# Patient Record
Sex: Male | Born: 1960 | Race: Black or African American | Hispanic: No | Marital: Single | Smoking: Former smoker
Health system: Southern US, Community
[De-identification: ages and names within clinical notes are randomized; demographics above are authoritative.]

## PROBLEM LIST (undated history)

## (undated) ENCOUNTER — Emergency Department (HOSPITAL_COMMUNITY): Payer: Self-pay | Source: Home / Self Care

## (undated) DIAGNOSIS — E785 Hyperlipidemia, unspecified: Secondary | ICD-10-CM

## (undated) DIAGNOSIS — F1910 Other psychoactive substance abuse, uncomplicated: Secondary | ICD-10-CM

## (undated) DIAGNOSIS — Q446 Cystic disease of liver: Secondary | ICD-10-CM

## (undated) DIAGNOSIS — C801 Malignant (primary) neoplasm, unspecified: Secondary | ICD-10-CM

## (undated) DIAGNOSIS — K648 Other hemorrhoids: Secondary | ICD-10-CM

## (undated) HISTORY — PX: HEMORRHOID BANDING: SHX5850

## (undated) HISTORY — PX: COLONOSCOPY: SHX174

## (undated) HISTORY — DX: Other hemorrhoids: K64.8

## (undated) HISTORY — DX: Hyperlipidemia, unspecified: E78.5

## (undated) HISTORY — DX: Other psychoactive substance abuse, uncomplicated: F19.10

---

## 2003-10-24 ENCOUNTER — Emergency Department (HOSPITAL_COMMUNITY): Admission: EM | Admit: 2003-10-24 | Discharge: 2003-10-24 | Payer: Self-pay

## 2017-03-04 ENCOUNTER — Emergency Department (HOSPITAL_COMMUNITY)
Admission: EM | Admit: 2017-03-04 | Discharge: 2017-03-04 | Disposition: A | Discharge status: Home / Self Care | Payer: Self-pay | Attending: Emergency Medicine | Admitting: Emergency Medicine

## 2017-03-04 DIAGNOSIS — Y929 Unspecified place or not applicable: Secondary | ICD-10-CM | POA: Insufficient documentation

## 2017-03-04 DIAGNOSIS — X500XXA Overexertion from strenuous movement or load, initial encounter: Secondary | ICD-10-CM | POA: Insufficient documentation

## 2017-03-04 DIAGNOSIS — M545 Low back pain, unspecified: Secondary | ICD-10-CM

## 2017-03-04 DIAGNOSIS — Y990 Civilian activity done for income or pay: Secondary | ICD-10-CM | POA: Insufficient documentation

## 2017-03-04 DIAGNOSIS — Y9389 Activity, other specified: Secondary | ICD-10-CM | POA: Insufficient documentation

## 2017-03-04 DIAGNOSIS — K625 Hemorrhage of anus and rectum: Secondary | ICD-10-CM | POA: Insufficient documentation

## 2017-03-04 LAB — I-STAT CHEM 8, ED
BUN: 17 mg/dL (ref 6–20)
CREATININE: 1.2 mg/dL (ref 0.61–1.24)
Calcium, Ion: 1.18 mmol/L (ref 1.15–1.40)
Chloride: 104 mmol/L (ref 101–111)
GLUCOSE: 92 mg/dL (ref 65–99)
HEMATOCRIT: 44 % (ref 39.0–52.0)
HEMOGLOBIN: 15 g/dL (ref 13.0–17.0)
POTASSIUM: 4.3 mmol/L (ref 3.5–5.1)
Sodium: 140 mmol/L (ref 135–145)
TCO2: 29 mmol/L (ref 0–100)

## 2017-03-04 LAB — POC OCCULT BLOOD, ED: FECAL OCCULT BLD: POSITIVE — AB

## 2017-03-04 MED ORDER — CYCLOBENZAPRINE HCL 10 MG PO TABS: 10.0000 mg | ORAL_TABLET | Freq: Two times a day (BID) | ORAL | 0 refills | Status: DC | PRN

## 2017-03-04 NOTE — Discharge Planning (Signed)
EDCM provided orange card brochure as pt is uninsured and may need care coordination in the future.

## 2017-03-04 NOTE — ED Triage Notes (Signed)
Pt reports lower back pain and this am pain was in right groin.

## 2017-03-04 NOTE — Discharge Instructions (Signed)
Please read instructions below. Talk with your primary care provider about any new medications. Return for new numbness, tingling or weakness in your legs, loss of bowel or bladder, uncontrolled pain.

## 2017-03-04 NOTE — Discharge Planning (Signed)
Garrett Clark J. Clydene Laming, RN, BSN, [place] [phone] [facility] set up appointment with Dr Altamease Oiler on 03/09/17 @ 1100.  Spoke with pt at bedside and advised to please arrive 15 min early and take a picture ID and your current medications.  Pt verbalizes understanding of keeping appointment.

## 2017-03-04 NOTE — ED Provider Notes (Signed)
Garrett Clark Provider Note   CSN: [id number] Arrival date & time: 03/04/17  6967     History   Chief Complaint Chief Complaint  Patient presents with  . Back Pain    HPI EMON LANCE is a 56 y.o. male.  Pt presents with low back pain w/ recent injury 3 weeks ago as well as intermittent hx of rectal bleeding since 2013. Pt reports he was lifting something heavy at work and had pain for a few days which subsided. He reports that his pain was minimal until this morning when we woke with increased pain and stiffness. Describes pain as sharp and 10/10 with movement (R>L side) and radiates to groin, mostly with standing from seated position and sitting down from standing position. States his pain is aching at rest. Reports that he took a total of 1600mg  of ibuprofen this morning around 8am. Denies loss of bowel or bladder, N/T in lower extremities.  Denies hx of major back injury. Pt reports rectal bleeding is intermittent since 2013, small amounts of bright red blood or dark red blood. Denies N/V/D/C, abd pain, hx colonoscopy, hemorrhoids.      No past medical history on file.  There are no active problems to display for this patient.   No past surgical history on file.     Home Medications    Prior to Admission medications   Not on File    Family History No family history on file.  Social History Social History  Substance Use Topics  . Smoking status: Not on file  . Smokeless tobacco: Not on file  . Alcohol use Not on file     Allergies   Patient has no known allergies.   Review of Systems Review of Systems  Constitutional: Negative for chills and fever.  Respiratory: Negative for shortness of breath.   Cardiovascular: Negative for chest pain.  Gastrointestinal: Positive for anal bleeding (intermittent since 2013). Negative for abdominal pain, nausea and vomiting.       No bowel incontinence  Genitourinary:       No bladder incontinence    Musculoskeletal: Positive for back pain (lower back pain w radiation to groin. Aching at rest, sharp with movement - changing from seated to standing position and vice versa. ).  Neurological: Negative for weakness and numbness.       No N/T in lower extremities. No saddle paresthesia     Physical Exam Updated Vital Signs BP 127/60 (BP Location: Right Arm)   Pulse (!) 55   Temp 97.8 F (36.6 C) (Oral)   Resp 16   SpO2 100%   Physical Exam  Constitutional: He is oriented to person, place, and time. He appears well-developed and well-nourished. No distress.  HENT:  Head: Normocephalic and atraumatic.  Eyes: EOM are normal.  Neck: Normal range of motion. Neck supple.  Cardiovascular: Regular rhythm, normal heart sounds and intact distal pulses.   2+ distal pulses  Pulmonary/Chest: Effort normal and breath sounds normal. He has no wheezes. He has no rales.  Abdominal: Soft. Bowel sounds are normal. He exhibits no distension and no mass. There is no tenderness.  Chaperone was present for exam. Normal rectal tone. No gross blood, positive fecal hemoccult. No hemorrhoids.   Musculoskeletal: Normal range of motion. He exhibits tenderness (TTP lower lumar spine and extends laterally.  ).  Neurological: He is alert and oriented to person, place, and time. No sensory deficit (Sensation equal b/l lower extremities).  Skin: Skin is warm  and dry.     ED Treatments / Results  Labs (all labs ordered are listed, but only abnormal results are displayed) Labs Reviewed - No data to display  EKG  EKG Interpretation None       Radiology No results found.  Procedures Procedures (including critical care time)  Medications Ordered in ED Medications - No data to display   Initial Impression / Assessment and Plan / ED Course  I have reviewed the triage vital signs and the nursing notes.  Pertinent labs & imaging results that were available during my care of the patient were reviewed  by me and considered in my medical decision making (see chart for details).     Pt presents w low back pain w recent injury 3 weeks ago. Injury occurred when lifting a heavy object at work. Reports that pain went away a few days after injury until this morning when he woke with increased pain and stiffness. Pain worse with bending, radiates to groin. Denies N/T, bowel or bladder incontinence. Pt also reports intermittent bright red and dark red blood in stool since 2013. Reports he has never had a colonoscopy. Denies abd pain, N/V, D/C, fatigue, weakness, hemorrhoids. On exam, lower extremities NV intact. TTP lower lumbar spine and paraspinal musculature. Normal rectal tone, no gross blood, positive fecal hemoccult. H/H stable. Pt reports pain to be under control during visit since taking Advil this morning. Counseled patient to discontinue use of NSAIDs 2/t rectal bleeding until he discusses with PCP. Spoke with Case Management for PCP referral. Discharge with referral to PCP and Flexeril. Encouraged OTC Tylenol for pain.   Discussed results, findings, treatment and follow up. Patient advised of return precautions. Patient verbalized understanding and agreed with plan.   Final Clinical Impressions(s) / ED Diagnoses   Final diagnoses:  None    New Prescriptions New Prescriptions   No medications on file     [place] Nicole Russo, PA-C 03/04/17 [place], MD 03/04/17 [phone]

## 2017-03-09 ENCOUNTER — Encounter (INDEPENDENT_AMBULATORY_CARE_PROVIDER_SITE_OTHER): Payer: Self-pay | Admitting: Physician Assistant

## 2017-03-09 ENCOUNTER — Ambulatory Visit (INDEPENDENT_AMBULATORY_CARE_PROVIDER_SITE_OTHER): Payer: Self-pay | Admitting: Physician Assistant

## 2017-03-09 ENCOUNTER — Encounter: Payer: Self-pay | Admitting: Physician Assistant

## 2017-03-09 VITALS — BP 116/73 | HR 62 | Temp 97.8°F | Ht 68.0 in | Wt 186.6 lb

## 2017-03-09 DIAGNOSIS — K921 Melena: Secondary | ICD-10-CM

## 2017-03-09 DIAGNOSIS — M545 Low back pain, unspecified: Secondary | ICD-10-CM

## 2017-03-09 LAB — POCT URINALYSIS DIPSTICK
Bilirubin, UA: NEGATIVE
Blood, UA: NEGATIVE
Glucose, UA: NEGATIVE
KETONES UA: NEGATIVE
Leukocytes, UA: NEGATIVE
Nitrite, UA: NEGATIVE
PROTEIN UA: NEGATIVE
Spec Grav, UA: 1.025 (ref 1.030–1.035)
Urobilinogen, UA: 0.2 (ref ?–2.0)
pH, UA: 6 (ref 5.0–8.0)

## 2017-03-09 MED ORDER — ACETAMINOPHEN 500 MG PO TABS: 1000.0000 mg | ORAL_TABLET | Freq: Three times a day (TID) | ORAL | 0 refills | Status: AC | PRN

## 2017-03-09 MED ORDER — ACETAMINOPHEN 500 MG PO TABS: 1000.0000 mg | ORAL_TABLET | Freq: Three times a day (TID) | ORAL | 0 refills | Status: DC | PRN

## 2017-03-09 NOTE — Progress Notes (Signed)
Subjective:  Patient ID: Garrett Clark, male    DOB: [birth year]  Age: 57 y.o. MRN: [id number]  CC:  LBP and hematochezia   HPI Garrett Clark is a 56 y.o. male with a recent medical history of right sided lower back pain and chronic hematochezia presents for f/u of ED visit on 03/05/17. Was discharged on Flexeril with minimal relief. Also taking OTC tylenol extra strength. Current pain at very mild but pain can be severe at random times. Onset of pain almost 4 weeks ago when lifting approximately 10 lbs on each hand as he was retrieving these items out of a "heat box".  Aggravating factor not identified. Alleviating factors not identified. In regards to hematochezia, says onset was in 2013 and is usually waxing and waning in nature. ED did not see hemorrhoids. Denies chest pain, SOB, HA, abdominal pain, abdominal bloating, f/c/n/v, rash, or GU sxs.   ROS Review of Systems  Constitutional: Negative for chills, fever and malaise/fatigue.  Eyes: Negative for blurred vision.  Respiratory: Negative for shortness of breath.   Cardiovascular: Negative for chest pain and palpitations.  Gastrointestinal: Negative for abdominal pain and nausea.  Genitourinary: Negative for dysuria and hematuria.  Musculoskeletal: Positive for back pain (right sided lower back). Negative for joint pain and myalgias.  Skin: Negative for rash.  Neurological: Negative for tingling and headaches.  Psychiatric/Behavioral: Negative for depression. The patient is not nervous/anxious.     Objective:  BP 116/73 (BP Location: Left Arm, Patient Position: Sitting, Cuff Size: Normal)   Pulse 62   Temp 97.8 F (36.6 C) (Oral)   Ht 5\' 8"  (1.727 m)   Wt 186 lb 9.6 oz (84.6 kg)   SpO2 100%   BMI 28.37 kg/m   BP/Weight 03/09/2017 2/87/6811  Systolic BP 572 620  Diastolic BP 73 89  Wt. (Lbs) 186.6 -  BMI 28.37 -      Physical Exam  Constitutional: He is oriented to person, place, and time.  Well developed, well  nourished, NAD, polite  HENT:  Head: Normocephalic and atraumatic.  Eyes: No scleral icterus.  Neck: Normal range of motion. Neck supple.  Cardiovascular: Normal rate, regular rhythm and normal heart sounds.   Pulmonary/Chest: Effort normal and breath sounds normal.  Abdominal: Soft. Bowel sounds are normal. There is no tenderness.  Genitourinary:  Genitourinary Comments: No internal or external hemorrhoids  Musculoskeletal: He exhibits no edema.  Back without limitation in aROM, no pain elicited on movement.  Neurological: He is alert and oriented to person, place, and time. No cranial nerve deficit. Coordination normal.  Skin: Skin is warm and dry. No rash noted. He is not diaphoretic. No erythema. No pallor.  Psychiatric: He has a normal mood and affect. His behavior is normal. Thought content normal.  Vitals reviewed.    Assessment & Plan:   1. Acute right-sided low back pain without sciatica - Continue on Cyclobenzaprine - acetaminophen (TYLENOL) 500 MG tablet; Take 2 tablets (1,000 mg total) by mouth every 8 (eight) hours as needed.  Dispense: 14 tablet; Refill: 0 - Urinalysis Dipstick negative - DG Lumbar Spine Complete  2. Hematochezia - Fecal occult blood test positive 4 days ago. - No hemorrhoid by exam in clinic todays - Comprehensive metabolic panel - CBC with Differential/Platelet   Meds ordered this encounter  Medications  . acetaminophen (TYLENOL) 500 MG tablet    Sig: Take 2 tablets (1,000 mg total) by mouth every 8 (eight) hours as needed.  Dispense:  14 tablet    Refill:  0    Order Specific Question:   Supervising Provider    Answer:   Tresa Garter [[id number]]    Follow-up: 2 weeks for general physical exam  Clent Demark PA

## 2017-03-09 NOTE — Patient Instructions (Signed)

## 2017-03-10 LAB — CBC WITH DIFFERENTIAL/PLATELET
BASOS ABS: 0 10*3/uL (ref 0.0–0.2)
Basos: 1 %
EOS (ABSOLUTE): 0.5 10*3/uL — AB (ref 0.0–0.4)
Eos: 12 %
Hematocrit: 45 % (ref 37.5–51.0)
Hemoglobin: 15.7 g/dL (ref 13.0–17.7)
Immature Grans (Abs): 0 10*3/uL (ref 0.0–0.1)
Immature Granulocytes: 0 %
Lymphocytes Absolute: 2 10*3/uL (ref 0.7–3.1)
Lymphs: 46 %
MCH: 31.1 pg (ref 26.6–33.0)
MCHC: 34.9 g/dL (ref 31.5–35.7)
MCV: 89 fL (ref 79–97)
MONOS ABS: 0.3 10*3/uL (ref 0.1–0.9)
Monocytes: 6 %
Neutrophils Absolute: 1.5 10*3/uL (ref 1.4–7.0)
Neutrophils: 35 %
Platelets: 309 10*3/uL (ref 150–379)
RBC: 5.05 x10E6/uL (ref 4.14–5.80)
RDW: 14.4 % (ref 12.3–15.4)
WBC: 4.3 10*3/uL (ref 3.4–10.8)

## 2017-03-10 LAB — COMPREHENSIVE METABOLIC PANEL
A/G RATIO: 1.3 (ref 1.2–2.2)
ALBUMIN: 4.5 g/dL (ref 3.5–5.5)
ALK PHOS: 97 IU/L (ref 39–117)
ALT: 15 IU/L (ref 0–44)
AST: 24 IU/L (ref 0–40)
BUN/Creatinine Ratio: 15 (ref 9–20)
BUN: 17 mg/dL (ref 6–24)
Bilirubin Total: 0.5 mg/dL (ref 0.0–1.2)
CALCIUM: 9.5 mg/dL (ref 8.7–10.2)
CHLORIDE: 98 mmol/L (ref 96–106)
CO2: 21 mmol/L (ref 18–29)
Creatinine, Ser: 1.14 mg/dL (ref 0.76–1.27)
GFR calc Af Amer: 84 mL/min/{1.73_m2} (ref >59)
GFR calc non Af Amer: 73 mL/min/{1.73_m2} (ref >59)
GLOBULIN, TOTAL: 3.4 g/dL (ref 1.5–4.5)
Glucose: 65 mg/dL (ref 65–99)
POTASSIUM: 4.7 mmol/L (ref 3.5–5.2)
SODIUM: 140 mmol/L (ref 134–144)
TOTAL PROTEIN: 7.9 g/dL (ref 6.0–8.5)

## 2017-03-11 ENCOUNTER — Ambulatory Visit (HOSPITAL_COMMUNITY)
Admission: RE | Admit: 2017-03-11 | Discharge: 2017-03-11 | Disposition: A | Discharge status: Home / Self Care | Payer: Self-pay | Source: Ambulatory Visit | Attending: Physician Assistant | Admitting: Physician Assistant

## 2017-03-11 DIAGNOSIS — M4606 Spinal enthesopathy, lumbar region: Secondary | ICD-10-CM | POA: Insufficient documentation

## 2017-03-11 DIAGNOSIS — M545 Low back pain: Secondary | ICD-10-CM | POA: Insufficient documentation

## 2017-03-19 ENCOUNTER — Ambulatory Visit (INDEPENDENT_AMBULATORY_CARE_PROVIDER_SITE_OTHER): Payer: Self-pay | Admitting: Physician Assistant

## 2017-03-19 ENCOUNTER — Encounter: Payer: Self-pay | Admitting: Physician Assistant

## 2017-03-19 VITALS — BP 110/78 | HR 64 | Ht 68.0 in | Wt 184.0 lb

## 2017-03-19 DIAGNOSIS — K625 Hemorrhage of anus and rectum: Secondary | ICD-10-CM

## 2017-03-19 NOTE — Progress Notes (Addendum)
Subjective:    Patient ID: Garrett Clark, male    DOB: 1961-10-26, 56 y.o.   MRN: [id number]  HPI Garrett Clark is a pleasant 56 year old African-American male, new to GI today referred by Domenica Fail PA-C/[place]  family medicine for evaluation of intermittent rectal bleeding. Patient is generally in good health with no known chronic medical problems. He had recently been evaluated at primary care with complaints of low back pain and mentioned that he been having intermittent rectal bleeding over the past 4 years. He has not had any prior GI evaluation. Patient denies any abdominal pain, and no changes in bowel movements. He says he has intermittently sporadically seen red blood after bowel movements both on the tissue and in the commode and has on occasion seen some streaks of red blood in his stool. Never had any associated rectal discomfort. He says he may see blood for couple of days and then perhaps not seen any blood for couple of months. Family history negative for colon cancer and polyps as far as he is aware. Recent labs done 03/09/2017 hemoglobin 15.7/hematocrit 45.0/MCV of 89 LFTs within normal limits  Review of Systems Pertinent positive and negative review of systems were noted in the above HPI section.  All other review of systems was otherwise negative.  Outpatient Encounter Prescriptions as of 03/19/2017  Medication Sig  . cyclobenzaprine (FLEXERIL) 10 MG tablet Take 1 tablet (10 mg total) by mouth 2 (two) times daily as needed for muscle spasms. (Patient taking differently: Take 10 mg by mouth as needed for muscle spasms. )   No facility-administered encounter medications on file as of 03/19/2017.    No Known Allergies There are no active problems to display for this patient.  Social History   Social History  . Marital status: Single    Spouse name: N/A  . Number of children: 2  . Years of education: N/A   Occupational History  . Garrett Clark    Social History Main  Topics  . Smoking status: Former Research scientist (life sciences)  . Smokeless tobacco: Never Used  . Alcohol use Yes     Comment: socially  . 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 Sexual activity: Not on file   Other Topics Concern  . Not on file   Social History Narrative  . No narrative on file    [name] family history includes Alcohol abuse in his mother; Cancer in his paternal grandfather; Diabetes in his father, mother, paternal grandmother, and sister; Hypertension in his mother; Kidney disease in his father; Stroke in his sister.      Objective:    Vitals:   03/19/17 1433  BP: 110/78  Pulse: 64    Physical Exam  well-developed African-American male in no acute distress, pleasant blood pressure 110/78, pulse 74, height 5 foot 8, weight 184, BMI 27.9. HEENT;nontraumatic normocephalic EOMI PERRLA sclera anicteric, Cardiovascular; regular rate and rhythm with S1-S2 no murmur or gallop, Pulmonary; clear bilaterally, Abdomen ;soft, nontender no palpable mass or hepatosplenomegaly bowel sounds are present on Rectal ;exam not done-rectal per PCP last week no internal or external hemorrhoids or lesions noted, Extremities ;no clubbing clubbing cyanosis or edema skin warm and dry, Neuropsych; mood and affect appropriate       Assessment & Plan:   #46 56 year old African-American male with intermittent rectal bleeding 4 years, no associated abdominal  discomfort changes in bowel habits or rectal discomfort. Rule out occult colon lesion, rule out internal hemorrhoidal bleeding #2 recent low back pain  Plan; Patient will be scheduled for colonoscopy with Dr. Carlean Purl. Procedure discussed in detail with patient including risks and benefits and he is agreeable to proceed.  Cherae Marton Genia Harold PA-C 03/19/2017   Cc: Clent Demark, PA-C   Agree with Ms. Genia Harold assessment and plan. Gatha Mayer, MD, [facility]

## 2017-03-19 NOTE — Patient Instructions (Signed)

## 2017-03-23 ENCOUNTER — Encounter (INDEPENDENT_AMBULATORY_CARE_PROVIDER_SITE_OTHER): Payer: Self-pay | Admitting: Physician Assistant

## 2017-03-23 ENCOUNTER — Ambulatory Visit (INDEPENDENT_AMBULATORY_CARE_PROVIDER_SITE_OTHER): Payer: Self-pay | Admitting: Physician Assistant

## 2017-03-23 VITALS — BP 99/58 | HR 53 | Temp 98.0°F | Resp 18 | Ht 68.0 in | Wt 184.0 lb

## 2017-03-23 DIAGNOSIS — M940 Chondrocostal junction syndrome [Tietze]: Secondary | ICD-10-CM

## 2017-03-23 DIAGNOSIS — Z0000 Encounter for general adult medical examination without abnormal findings: Secondary | ICD-10-CM

## 2017-03-23 DIAGNOSIS — F1411 Cocaine abuse, in remission: Secondary | ICD-10-CM | POA: Insufficient documentation

## 2017-03-23 DIAGNOSIS — Z23 Encounter for immunization: Secondary | ICD-10-CM

## 2017-03-23 LAB — POCT URINALYSIS DIPSTICK
Glucose, UA: NEGATIVE
Leukocytes, UA: NEGATIVE
Nitrite, UA: NEGATIVE
PROTEIN UA: 100
RBC UA: NEGATIVE
Spec Grav, UA: >1.035 — AB (ref 1.030–1.035)
UROBILINOGEN UA: 1 (ref ?–2.0)
pH, UA: 5.5 (ref 5.0–8.0)

## 2017-03-23 MED ORDER — TETANUS-DIPHTH-ACELL PERTUSSIS 5-2.5-18.5 LF-MCG/0.5 IM SUSP
0.5000 mL | Freq: Once | INTRAMUSCULAR | Status: AC
  Administered 2017-03-23: 0.5 mL via INTRAMUSCULAR

## 2017-03-23 NOTE — Patient Instructions (Signed)
Keep your colonoscopy appointment for April.    Td Vaccine (Tetanus and Diphtheria): What You Need to Know 1. Why get vaccinated? Tetanus  and diphtheria are very serious diseases. They are rare in the [place] today, but people who do become infected often have severe complications. Td vaccine is used to protect adolescents and adults from both of these diseases. Both tetanus and diphtheria are infections caused by bacteria. Diphtheria spreads from person to person through coughing or sneezing. Tetanus-causing bacteria enter the body through cuts, scratches, or wounds. TETANUS (lockjaw) causes painful muscle tightening and stiffness, usually all over the body.  It can lead to tightening of muscles in the head and neck so you can't open your mouth, swallow, or sometimes even breathe. Tetanus kills about 1 out of every 10 people who are infected even after receiving the best medical care. DIPHTHERIA can cause a thick coating to form in the back of the throat.  It can lead to breathing problems, paralysis, heart failure, and death. Before vaccines, as many as 200,000 cases of diphtheria and hundreds of cases of tetanus were reported in the [place] each year. Since vaccination began, reports of cases for both diseases have dropped by about 99%. 2. Td vaccine Td vaccine can protect adolescents and adults from tetanus and diphtheria. Td is usually given as a booster dose every 10 years but it can also be given earlier after a severe and dirty wound or burn. Another vaccine, called Tdap, which protects against pertussis in addition to tetanus and diphtheria, is sometimes recommended instead of Td vaccine. Your doctor or the person giving you the vaccine can give you more information. Td may safely be given at the same time as other vaccines. 3. Some people should not get this vaccine  A person who has ever had a life-threatening allergic reaction after a previous dose of any tetanus or  diphtheria containing vaccine, OR has a severe allergy to any part of this vaccine, should not get Td vaccine. Tell the person giving the vaccine about any severe allergies.  Talk to your doctor if you:  had severe pain or swelling after any vaccine containing diphtheria or tetanus,  ever had a condition called Guillain Barre Syndrome (GBS),  aren't feeling well on the day the shot is scheduled. 4. What are the risks from Td vaccine? With any medicine, including vaccines, there is a chance of side effects. These are usually mild and go away on their own. Serious reactions are also possible but are rare. Most people who get Td vaccine do not have any problems with it. Mild problems following Td vaccine:  (Did not interfere with activities)  Pain where the shot was given (about 8 people in 10)  Redness or swelling where the shot was given (about 1 person in 4)  Mild fever (rare)  Headache (about 1 person in 4)  Tiredness (about 1 person in 4) Moderate problems following Td vaccine:  (Interfered with activities, but did not require medical attention)  Fever over 102F (rare) Severe problems following Td vaccine:  (Unable to perform usual activities; required medical attention)  Swelling, severe pain, bleeding and/or redness in the arm where the shot was given (rare). Problems that could happen after any vaccine:   People sometimes faint after a medical procedure, including vaccination. Sitting or lying down for about 15 minutes can help prevent fainting, and injuries caused by a fall. Tell your doctor if you feel dizzy, or have vision changes or  ringing in the ears.  Some people get severe pain in the shoulder and have difficulty moving the arm where a shot was given. This happens very rarely.  Any medication can cause a severe allergic reaction. Such reactions from a vaccine are very rare, estimated at fewer than 1 in a million doses, and would happen within a few minutes to a few  hours after the vaccination. As with any medicine, there is a very remote chance of a vaccine causing a serious injury or death. The safety of vaccines is always being monitored. For more information, visit: http://www.aguilar.org/ 5. What if there is a serious reaction? What should I look for?  Look for anything that concerns you, such as signs of a severe allergic reaction, very high fever, or unusual behavior. Signs of a severe allergic reaction can include hives, swelling of the face and throat, difficulty breathing, a fast heartbeat, dizziness, and weakness. These would usually start a few minutes to a few hours after the vaccination. What should I do?   If you think it is a severe allergic reaction or other emergency that can't wait, call 9-1-1 or get the person to the nearest hospital. Otherwise, call your doctor.  Afterward, the reaction should be reported to the Vaccine Adverse Event Reporting System (VAERS). Your doctor might file this report, or you can do it yourself through the VAERS web site at www.vaers.SamedayNews.es, or by calling 985-389-0570.  VAERS does not give medical advice. 6. The National Vaccine Injury Compensation Program The [employer] Vaccine Injury Compensation Program (VICP) is a federal program that was created to compensate people who may have been injured by certain vaccines. Persons who believe they may have been injured by a vaccine can learn about the program and about filing a claim by calling 9387048572 or visiting the [place] website at GoldCloset.com.ee. There is a time limit to file a claim for compensation. 7. How can I learn more?  Ask your doctor. He or she can give you the vaccine package insert or suggest other sources of information.  Call your local or state health department.  Contact the Centers for Disease Control and Prevention (CDC):  Call 989-877-0639 (1-800-CDC-INFO)  Visit CDC's website at http://hunter.com/ CDC Td  Vaccine VIS (04/01/16) This information is not intended to replace advice given to you by your health care provider. Make sure you discuss any questions you have with your health care provider. Document Released: 10/05/2006 Document Revised: 08/28/2016 Document Reviewed: 08/28/2016 Elsevier Interactive Patient Education  2017 [employer].

## 2017-03-23 NOTE — Progress Notes (Signed)
Subjective:  Patient ID: Garrett Clark, male    DOB: 1961/04/16  Age: 56 y.o. MRN: [id number]  CC: annual exam  HPI Garrett Clark is a 56 y.o. male with a PMH of cocaine use and LBP presents for an annual physical. Only complaints are occasional LBP attributed to work and occasional sharp chest pain "once every few months". Chest pain lasts momentarily and is associated with movement or deep inspiration. Chest pain nonradiating and not associated with other symptoms. LBP is also occasional and is relieved with cyclobenzaprine. Otherwise feels well. Does not use cocaine any longer.      Review of Systems  Constitutional: Negative for chills, fever and malaise/fatigue.  Eyes: Negative for blurred vision.  Respiratory: Negative for shortness of breath.   Cardiovascular: Positive for chest pain (sharp, "every 2-3 months"). Negative for palpitations.  Gastrointestinal: Negative for abdominal pain and nausea.  Genitourinary: Negative for dysuria and hematuria.  Musculoskeletal: Positive for back pain. Negative for joint pain and myalgias.  Skin: Negative for rash.  Neurological: Negative for tingling and headaches.  Psychiatric/Behavioral: Negative for depression. The patient is not nervous/anxious.     Objective:  BP (!) 99/58 (BP Location: Left Arm, Patient Position: Sitting, Cuff Size: Normal)   Pulse (!) 53   Temp 98 F (36.7 C) (Oral)   Resp 18   Ht 5\' 8"  (1.727 m)   Wt 184 lb (83.5 kg)   SpO2 97%   BMI 27.98 kg/m   BP/Weight 03/23/2017 03/19/2017 07/10/9469  Systolic BP 99 962 836  Diastolic BP 58 78 73  Wt. (Lbs) 184 184 186.6  BMI 27.98 27.98 28.37     Physical Exam  Constitutional: He is oriented to person, place, and time.  Well developed, well nourished, NAD, polite  HENT:  Head: Normocephalic and atraumatic.  Eyes: Pupils are equal, round, and reactive to light. No scleral icterus.  Neck: Normal range of motion. Neck supple. No thyromegaly present.   Cardiovascular: Normal rate, regular rhythm and normal heart sounds.   Pulmonary/Chest: Effort normal and breath sounds normal.  Abdominal: Soft. Bowel sounds are normal. There is no tenderness.  No hepatomegaly.   Genitourinary: Penis normal.  Genitourinary Comments: No inguinal hernia  Musculoskeletal: He exhibits no edema.  Full aROM of UEs, LEs, and back.  Neurological: He is alert and oriented to person, place, and time.  Skin: Skin is warm and dry. No rash noted. He is not diaphoretic. No erythema. No pallor.  Psychiatric: He has a normal mood and affect. His behavior is normal. Thought content normal.  Vitals reviewed.    Assessment & Plan:   1. Annual physical exam - POCT urinalysis dipstick negative in clinic today. - Tdap (BOOSTRIX) injection 0.5 mL; Inject 0.5 mLs into the muscle once. - PSA; Future - Lipid Panel; Future - ECG. Pt did not get done here today due to unavailability of staff knowledge on the use of [facility] ECG. Patient will be called tomorrow for a nurse visit appointment to have ECG done. Pt is asymptomatic but past cocaine use and low BP/HR indicate the need for prudence. Complaint of occasional sharp  chest pain "every 2-3 months" likely consistent with costochondritis.   2. Need for Tdap vaccination - Tdap (BOOSTRIX) injection 0.5 mL; Inject 0.5 mLs into the muscle once.   Meds ordered this encounter  Medications  . Tdap (BOOSTRIX) injection 0.5 mL    Follow-up: Return in about 4 weeks (around 04/20/2017) for LBP.   Lesli Albee  Altamease Oiler PA

## 2017-03-23 NOTE — Progress Notes (Signed)
Patient is here for Physical  Patient has not taken medication today. Patient has eaten today.  Patient tolerated injection well today.                                           Patient is here for Physical  Patient complains of lower back pain being present currently scaled at a 4.  Patient has taken flexeril and ibuprofen today. Patient has not eaten today.

## 2017-03-24 ENCOUNTER — Telehealth (INDEPENDENT_AMBULATORY_CARE_PROVIDER_SITE_OTHER): Payer: Self-pay | Admitting: Physician Assistant

## 2017-03-24 NOTE — Progress Notes (Signed)
  Subjective:  Patient ID: Garrett Clark, male    DOB: 1961-05-31  Age: 56 y.o. MRN: [id number]  CC:  Annual exam   HPI Garrett Clark is a 56 y.o. male with a PMH of cocaine use and LBP presents for an annual physical. Only complaints are occasional LBP attributed to work and occasional sharp chest pain "once every few months". Chest pain lasts momentarily and is associated with movement or deep inspiration. Chest pain nonradiating and not associated with other symptoms. LBP is also occasional and is relieved with cyclobenzaprine. Otherwise feels well. Does not use cocaine any longer.     ROS Review of Systems  Constitutional: Negative for chills, fever and malaise/fatigue.  Eyes: Negative for blurred vision.  Respiratory: Negative for shortness of breath.   Cardiovascular: Positive for chest pain. Negative for palpitations.  Gastrointestinal: Negative for abdominal pain and nausea.  Genitourinary: Negative for dysuria and hematuria.  Musculoskeletal: Positive for back pain. Negative for joint pain and myalgias.  Skin: Negative for rash.  Neurological: Negative for tingling and headaches.  Psychiatric/Behavioral: Negative for depression. The patient is not nervous/anxious.     Objective:  BP (!) 99/58 (BP Location: Left Arm, Patient Position: Sitting, Cuff Size: Normal)   Pulse (!) 53   Temp 98 F (36.7 C) (Oral)   Resp 18   Ht 5\' 8"  (1.727 m)   Wt 184 lb (83.5 kg)   SpO2 97%   BMI 27.98 kg/m   BP/Weight 03/23/2017 03/19/2017 4/78/2956  Systolic BP 99 213 086  Diastolic BP 58 78 73  Wt. (Lbs) 184 184 186.6  BMI 27.98 27.98 28.37      Physical Exam  Constitutional: He is oriented to person, place, and time.  Well developed, well nourished, NAD, polite  HENT:  Head: Normocephalic and atraumatic.  Eyes: No scleral icterus.  Neck: Normal range of motion. Neck supple. No thyromegaly present.  Cardiovascular: Normal rate, regular rhythm and normal heart sounds.    Pulmonary/Chest: Effort normal and breath sounds normal.  Abdominal: Soft. Bowel sounds are normal. There is no tenderness.  No hepatomegaly.  Genitourinary: Penis normal.  Genitourinary Comments: No inguinal hernia  Musculoskeletal: He exhibits no edema.  Full aROM of upper extremities, lower extremities, and back.  Neurological: He is alert and oriented to person, place, and time.  Skin: Skin is warm and dry. No rash noted. No erythema. No pallor.  Psychiatric: He has a normal mood and affect. His behavior is normal. Thought content normal.  Vitals reviewed.    Assessment & Plan:   1. Annual physical exam - POCT urinalysis dipstick - Tdap (BOOSTRIX) injection 0.5 mL; Inject 0.5 mLs into the muscle once. - PSA; Future - Lipid Panel; Future  2. Need for Tdap vaccination - Tdap (BOOSTRIX) injection 0.5 mL; Inject 0.5 mLs into the muscle once.  3. Costochondritis - Pt will still have to get EKG done. We could not complete EKG due to software conflict issue.  Meds ordered this encounter  Medications  . Tdap (BOOSTRIX) injection 0.5 mL    Follow-up: Return in about 4 weeks (around 04/20/2017) for LBP.   Clent Demark PA

## 2017-03-25 ENCOUNTER — Other Ambulatory Visit (INDEPENDENT_AMBULATORY_CARE_PROVIDER_SITE_OTHER): Payer: Self-pay | Admitting: Physician Assistant

## 2017-03-25 ENCOUNTER — Other Ambulatory Visit (INDEPENDENT_AMBULATORY_CARE_PROVIDER_SITE_OTHER): Payer: Self-pay

## 2017-03-25 ENCOUNTER — Encounter: Payer: Self-pay | Admitting: Physician Assistant

## 2017-03-25 VITALS — Wt 185.8 lb

## 2017-03-25 DIAGNOSIS — R079 Chest pain, unspecified: Secondary | ICD-10-CM

## 2017-03-25 NOTE — Progress Notes (Signed)
Pt here for labs and to get EKG done. EKG ordered yesterday but there was software conflict with [employer] and [employer]. EKG shows sinus bradycardia. I discussed with patient. No treatment needed at this time.

## 2017-03-26 LAB — PSA: Prostate Specific Ag, Serum: 0.6 ng/mL (ref 0.0–4.0)

## 2017-03-26 LAB — LIPID PANEL
CHOLESTEROL TOTAL: 231 mg/dL — AB (ref 100–199)
Chol/HDL Ratio: 5.4 ratio — ABNORMAL HIGH (ref 0.0–5.0)
HDL: 43 mg/dL (ref >39)
LDL Calculated: 170 mg/dL — ABNORMAL HIGH (ref 0–99)
TRIGLYCERIDES: 91 mg/dL (ref 0–149)
VLDL CHOLESTEROL CAL: 18 mg/dL (ref 5–40)

## 2017-03-26 LAB — PLEASE NOTE

## 2017-03-27 ENCOUNTER — Ambulatory Visit (INDEPENDENT_AMBULATORY_CARE_PROVIDER_SITE_OTHER): Payer: Self-pay

## 2017-03-30 ENCOUNTER — Other Ambulatory Visit (INDEPENDENT_AMBULATORY_CARE_PROVIDER_SITE_OTHER): Payer: Self-pay | Admitting: Physician Assistant

## 2017-03-30 DIAGNOSIS — E7800 Pure hypercholesterolemia, unspecified: Secondary | ICD-10-CM | POA: Insufficient documentation

## 2017-03-30 MED ORDER — ATORVASTATIN CALCIUM 20 MG PO TABS: 20.0000 mg | ORAL_TABLET | Freq: Every evening | ORAL | 1 refills | Status: DC

## 2017-03-30 NOTE — Progress Notes (Signed)
LDL elevated.

## 2017-04-02 ENCOUNTER — Telehealth (INDEPENDENT_AMBULATORY_CARE_PROVIDER_SITE_OTHER): Payer: Self-pay | Admitting: Physician Assistant

## 2017-04-02 NOTE — Telephone Encounter (Signed)
No answer. Will try again

## 2017-04-02 NOTE — Telephone Encounter (Signed)
I spoke to patient and he said he had EKG done. I did not see it in the system but CMA helped find the tracing. No acute abnormalities on EKG.

## 2017-04-03 ENCOUNTER — Ambulatory Visit (INDEPENDENT_AMBULATORY_CARE_PROVIDER_SITE_OTHER): Payer: Self-pay

## 2017-05-08 ENCOUNTER — Encounter: Payer: Self-pay | Admitting: Internal Medicine

## 2017-05-08 ENCOUNTER — Ambulatory Visit (AMBULATORY_SURGERY_CENTER): Payer: Self-pay | Admitting: Internal Medicine

## 2017-05-08 VITALS — BP 139/77 | HR 38 | Temp 98.4°F | Resp 18 | Ht 68.0 in | Wt 184.0 lb

## 2017-05-08 DIAGNOSIS — K625 Hemorrhage of anus and rectum: Secondary | ICD-10-CM

## 2017-05-08 MED ORDER — SODIUM CHLORIDE 0.9 % IV SOLN: 500.0000 mL | INTRAVENOUS | Status: DC

## 2017-05-08 NOTE — Progress Notes (Signed)
Last use of marijuana 05/08/17

## 2017-05-08 NOTE — Op Note (Signed)
[facility] Patient Name: Garrett Clark Procedure Date: 05/08/2017 2:34 PM MRN: [id number] Endoscopist: Gatha Mayer , MD Age: 56 Referring MD:  Date of Birth: May 16, 1961 Gender: Male Account #: [id number] Procedure:                Colonoscopy Indications:              Rectal bleeding Medicines:                Propofol per Anesthesia, Monitored Anesthesia Care Procedure:                Pre-Anesthesia Assessment:                           - Prior to the procedure, a History and Physical                            was performed, and patient medications and                            allergies were reviewed. The patient's tolerance of                            previous anesthesia was also reviewed. The risks                            and benefits of the procedure and the sedation                            options and risks were discussed with the patient.                            All questions were answered, and informed consent                            was obtained. Prior Anticoagulants: The patient has                            taken no previous anticoagulant or antiplatelet                            agents. ASA Grade Assessment: II - A patient with                            mild systemic disease. After reviewing the risks                            and benefits, the patient was deemed in                            satisfactory condition to undergo the procedure.                           After obtaining informed consent, the colonoscope  was passed under direct vision. Throughout the                            procedure, the patient's blood pressure, pulse, and                            oxygen saturations were monitored continuously. The                            Colonoscope was introduced through the anus and                            advanced to the the cecum, identified by                            appendiceal orifice and  ileocecal valve. The                            colonoscopy was performed without difficulty. The                            patient tolerated the procedure well. The quality                            of the bowel preparation was excellent. The bowel                            preparation used was Miralax. The ileocecal valve,                            appendiceal orifice, and rectum were photographed. Scope In: 2:41:01 PM Scope Out: 2:51:52 PM Scope Withdrawal Time: 0 hours 8 minutes 18 seconds  Total Procedure Duration: 0 hours 10 minutes 51 seconds  Findings:                 The perianal and digital rectal examinations were                            normal. Pertinent negatives include normal prostate                            (size, shape, and consistency).                           Internal hemorrhoids were found during retroflexion.                           The exam was otherwise without abnormality on                            direct and retroflexion views. Complications:            No immediate complications. Estimated Blood Loss:     Estimated blood loss: none. Impression:               - Internal  hemorrhoids.                           - The examination was otherwise normal on direct                            and retroflexion views.                           - No specimens collected. Recommendation:           - Patient has a contact number available for                            emergencies. The signs and symptoms of potential                            delayed complications were discussed with the                            patient. Return to normal activities tomorrow.                            Written discharge instructions were provided to the                            patient.                           - Resume previous diet.                           - Continue present medications.                           - Repeat colonoscopy in 10 years for screening                             purposes.                           - will arrange hemorrhoid banding if he is                            interested Gatha Mayer, MD 05/08/2017 2:55:50 PM This report has been signed electronically.

## 2017-05-08 NOTE — Progress Notes (Signed)
Report to PACU, RN, vss, BBS= Clear.  

## 2017-05-08 NOTE — Patient Instructions (Addendum)
I think you have been bleeding from hemorrhoids.  I can treat these with rubber banding if desired. Will discuss.  Next routine colonoscopy or other screening test in 10 years - 2028  I appreciate the opportunity to care for you. Gatha Mayer, MD, FACG   YOU HAD AN ENDOSCOPIC PROCEDURE TODAY AT [facility]:   Refer to the procedure report that was given to you for any specific questions about what was found during the examination.  If the procedure report does not answer your questions, please call your gastroenterologist to clarify.  If you requested that your care partner not be given the details of your procedure findings, then the procedure report has been included in a sealed envelope for you to review at your convenience later.  YOU SHOULD EXPECT: Some feelings of bloating in the abdomen. Passage of more gas than usual.  Walking can help get rid of the air that was put into your GI tract during the procedure and reduce the bloating. If you had a lower endoscopy (such as a colonoscopy or flexible sigmoidoscopy) you may notice spotting of blood in your stool or on the toilet paper. If you underwent a bowel prep for your procedure, you may not have a normal bowel movement for a few days.  Please Note:  You might notice some irritation and congestion in your nose or some drainage.  This is from the oxygen used during your procedure.  There is no need for concern and it should clear up in a day or so.  SYMPTOMS TO REPORT IMMEDIATELY:   Following lower endoscopy (colonoscopy or flexible sigmoidoscopy):  Excessive amounts of blood in the stool  Significant tenderness or worsening of abdominal pains  Swelling of the abdomen that is new, acute  Fever of 100F or higher    For urgent or emergent issues, a gastroenterologist can be reached at any hour by calling [phone].   DIET:  We do recommend a small meal at first, but then you may proceed to your  regular diet.  Drink plenty of fluids but you should avoid alcoholic beverages for 24 hours.  ACTIVITY:  You should plan to take it easy for the rest of today and you should NOT DRIVE or use heavy machinery until tomorrow (because of the sedation medicines used during the test).    FOLLOW UP: Our staff will call the number listed on your records the next business day following your procedure to check on you and address any questions or concerns that you may have regarding the information given to you following your procedure. If we do not reach you, we will leave a message.  However, if you are feeling well and you are not experiencing any problems, there is no need to return our call.  We will assume that you have returned to your regular daily activities without incident.  If any biopsies were taken you will be contacted by phone or by letter within the next 1-3 weeks.  Please call us at [phone] if you have not heard about the biopsies in 3 weeks.    SIGNATURES/CONFIDENTIALITY: You and/or your care partner have signed paperwork which will be entered into your electronic medical record.  These signatures attest to the fact that that the information above on your After Visit Summary has been reviewed and is understood.  Full responsibility of the confidentiality of this discharge information lies with you and/or your care-partner.    Information on  hemorrhoidal banding given to you today.Dr.Gessner's office will contact you to set up appointment for banding if you decide you want this procedure done

## 2017-05-11 ENCOUNTER — Telehealth: Payer: Self-pay | Admitting: *Deleted

## 2017-05-11 NOTE — Telephone Encounter (Signed)
No answer, unable to leave message, mailbox is full. 

## 2017-05-12 ENCOUNTER — Telehealth: Payer: Self-pay

## 2017-05-12 NOTE — Telephone Encounter (Signed)
Patient called back and booked a banding appt for 06/19/17 at 3pm.

## 2017-05-12 NOTE — Telephone Encounter (Signed)
-----   Message from Gatha Mayer, MD sent at 05/08/2017  3:00 PM EDT ----- Regarding: Guess what again!!! Banding appointment please

## 2017-05-12 NOTE — Telephone Encounter (Signed)
  Left detailed message to call us back to set up a banding appointment with Dr Carlean Purl.

## 2017-06-19 ENCOUNTER — Ambulatory Visit (INDEPENDENT_AMBULATORY_CARE_PROVIDER_SITE_OTHER): Payer: Self-pay | Admitting: Internal Medicine

## 2017-06-19 ENCOUNTER — Encounter: Payer: Self-pay | Admitting: Internal Medicine

## 2017-06-19 VITALS — BP 120/80 | HR 60 | Ht 69.0 in | Wt 174.2 lb

## 2017-06-19 DIAGNOSIS — K641 Second degree hemorrhoids: Secondary | ICD-10-CM

## 2017-06-19 NOTE — Patient Instructions (Addendum)
HEMORRHOID BANDING PROCEDURE    FOLLOW-UP CARE   1. The procedure you have had should have been relatively painless since the banding of the area involved does not have nerve endings and there is no pain sensation.  The rubber band cuts off the blood supply to the hemorrhoid and the band may fall off as soon as 48 hours after the banding (the band may occasionally be seen in the toilet bowl following a bowel movement). You may notice a temporary feeling of fullness in the rectum which should respond adequately to plain Tylenol or Motrin.  2. Following the banding, avoid strenuous exercise that evening and resume full activity the next day.  A sitz bath (soaking in a warm tub) or bidet is soothing, and can be useful for cleansing the area after bowel movements.     3. To avoid constipation, take two tablespoons of natural wheat bran, natural oat bran, flax, Benefiber or any over the counter fiber supplement and increase your water intake to 7-8 glasses daily.    4. Unless you have been prescribed anorectal medication, do not put anything inside your rectum for two weeks: No suppositories, enemas, fingers, etc.  5. Occasionally, you may have more bleeding than usual after the banding procedure.  This is often from the untreated hemorrhoids rather than the treated one.  Don't be concerned if there is a tablespoon or so of blood.  If there is more blood than this, lie flat with your bottom higher than your head and apply an ice pack to the area. If the bleeding does not stop within a half an hour or if you feel faint, call our office at [phone] or go to the emergency room.  6. Problems are not common; however, if there is a substantial amount of bleeding, severe pain, chills, fever or difficulty passing urine (very rare) or other problems, you should call us at [phone] or report to the nearest emergency room.  7. Do not stay seated continuously for more than 2-3 hours for a day or two  after the procedure.  Tighten your buttock muscles 10-15 times every two hours and take 10-15 deep breaths every 1-2 hours.  Do not spend more than a few minutes on the toilet if you cannot empty your bowel; instead re-visit the toilet at a later time.   We will see you back 07/10/17 at 3:15pm for additional banding.   I appreciate the opportunity to care for you. Silvano Rusk, MD, [facility]

## 2017-06-19 NOTE — Assessment & Plan Note (Signed)
Right anterior banded will return in about 3 weeks for repeat banding

## 2017-06-19 NOTE — Progress Notes (Signed)
  Hemorrhoidal ligation  Symptoms are bleeding and prolapse symptoms.  Rectal exam feels normal anoderm normal tone no mass nontender  Anoscopy demonstrates grade 2 internal hemorrhoids all 3 positions with inflammation, moderate  PROCEDURE NOTE: The patient presents with symptomatic grade 2  hemorrhoids, requesting rubber band ligation of his/her hemorrhoidal disease.  All risks, benefits and alternative forms of therapy were described and informed consent was obtained.   The anorectum was pre-medicated with 0.125% NTG and 5% lidocaine The decision was made to band the RA internal hemorrhoid, and the [place] was used to perform band ligation without complication.  Digital anorectal examination was then performed to assure proper positioning of the band, and to adjust the banded tissue as required.  The patient was discharged home without pain or other issues.  Dietary and behavioral recommendations were given and along with follow-up instructions.     The following adjunctive treatments were recommended:  Benefiber 1 tbsp qd  The patient will return in 3 weeks for  follow-up and possible additional banding as required. No complications were encountered and the patient tolerated the procedure well.  I appreciate the opportunity to care for this patient. Cc;Gomez, Lesli Albee, PA-C

## 2017-07-10 ENCOUNTER — Encounter: Payer: Self-pay | Admitting: Internal Medicine

## 2017-07-10 ENCOUNTER — Ambulatory Visit (INDEPENDENT_AMBULATORY_CARE_PROVIDER_SITE_OTHER): Payer: Self-pay | Admitting: Internal Medicine

## 2017-07-10 DIAGNOSIS — K641 Second degree hemorrhoids: Secondary | ICD-10-CM

## 2017-07-10 NOTE — Progress Notes (Signed)
   Hemorrhoid ligation:  sxs are bleeding and prolapse  PROCEDURE NOTE: The patient presents with symptomatic grade 2  hemorrhoids, requesting rubber band ligation of his/her hemorrhoidal disease.  All risks, benefits and alternative forms of therapy were described and informed consent was obtained.   The anorectum was pre-medicated with 0.125% nitroglycerin and 5% lidocaine The decision was made to band the right posterior and left lateral internal hemorrhoid, and the [place] was used to perform band ligation without complication.  Digital anorectal examination was then performed to assure proper positioning of the band, and to adjust the banded tissue as required.  The patient was discharged home without pain or other issues.  Dietary and behavioral recommendations were given and along with follow-up instructions.       The patient will return as needed for  follow-up and possible additional banding as required. No complications were encountered and the patient tolerated the procedure well.  I appreciate the opportunity to care for this patient. CC: Clent Demark, PA-C

## 2017-07-10 NOTE — Assessment & Plan Note (Signed)
RP and LL banded 

## 2017-07-10 NOTE — Patient Instructions (Addendum)
   HEMORRHOID BANDING PROCEDURE    FOLLOW-UP CARE   1. The procedure you have had should have been relatively painless since the banding of the area involved does not have nerve endings and there is no pain sensation.  The rubber band cuts off the blood supply to the hemorrhoid and the band may fall off as soon as 48 hours after the banding (the band may occasionally be seen in the toilet bowl following a bowel movement). You may notice a temporary feeling of fullness in the rectum which should respond adequately to plain Tylenol or Motrin.  2. Following the banding, avoid strenuous exercise that evening and resume full activity the next day.  A sitz bath (soaking in a warm tub) or bidet is soothing, and can be useful for cleansing the area after bowel movements.     3. To avoid constipation, take two tablespoons of natural wheat bran, natural oat bran, flax, Benefiber or any over the counter fiber supplement and increase your water intake to 7-8 glasses daily.    4. Unless you have been prescribed anorectal medication, do not put anything inside your rectum for two weeks: No suppositories, enemas, fingers, etc.  5. Occasionally, you may have more bleeding than usual after the banding procedure.  This is often from the untreated hemorrhoids rather than the treated one.  Don't be concerned if there is a tablespoon or so of blood.  If there is more blood than this, lie flat with your bottom higher than your head and apply an ice pack to the area. If the bleeding does not stop within a half an hour or if you feel faint, call our office at [phone] or go to the emergency room.  6. Problems are not common; however, if there is a substantial amount of bleeding, severe pain, chills, fever or difficulty passing urine (very rare) or other problems, you should call us at [phone] or report to the nearest emergency room.  7. Do not stay seated continuously for more than 2-3 hours for a  day or two after the procedure.  Tighten your buttock muscles 10-15 times every two hours and take 10-15 deep breaths every 1-2 hours.  Do not spend more than a few minutes on the toilet if you cannot empty your bowel; instead re-visit the toilet at a later time.    You may follow-up as needed - if October comes and you are still having symptoms please call and make an appointment.  I appreciate the opportunity to care for you. Gatha Mayer, MD, [facility]

## 2017-07-13 ENCOUNTER — Emergency Department (HOSPITAL_COMMUNITY)
Admission: EM | Admit: 2017-07-13 | Discharge: 2017-07-13 | Disposition: A | Discharge status: Home / Self Care | Payer: Self-pay | Attending: Emergency Medicine | Admitting: Emergency Medicine

## 2017-07-13 ENCOUNTER — Encounter (HOSPITAL_COMMUNITY): Payer: Self-pay | Admitting: Emergency Medicine

## 2017-07-13 DIAGNOSIS — L03114 Cellulitis of left upper limb: Secondary | ICD-10-CM | POA: Insufficient documentation

## 2017-07-13 DIAGNOSIS — L0390 Cellulitis, unspecified: Secondary | ICD-10-CM

## 2017-07-13 DIAGNOSIS — Z87891 Personal history of nicotine dependence: Secondary | ICD-10-CM | POA: Insufficient documentation

## 2017-07-13 MED ORDER — SULFAMETHOXAZOLE-TRIMETHOPRIM 800-160 MG PO TABS: 1.0000 | ORAL_TABLET | Freq: Two times a day (BID) | ORAL | 0 refills | Status: AC

## 2017-07-13 MED ORDER — CEPHALEXIN 500 MG PO CAPS: 500.0000 mg | ORAL_CAPSULE | Freq: Four times a day (QID) | ORAL | 0 refills | Status: DC

## 2017-07-13 NOTE — ED Triage Notes (Signed)
Pt. Stated, I got bit by something on my left elbow. Last Thursday.

## 2017-07-13 NOTE — Discharge Instructions (Signed)
You were seen in the ER today for a rash to your arm. Please take full course of Keflex and Bactrim (antibiotics) as prescribed--take with food to prevent GI upset. Please follow up with your primary care doctor in 2 days for a re-check. Apply warm moist heat such as a warm washcloth for 20 minutes four times a day to help promote drainage. Take Tylenol as needed for pain--follow over the counter label instructions. Return to the ER if you experience fevers, chills, unexplained weight loss, sudden or severe headaches, neck pain, chest pain, shortness of breath, abdominal pain, nausea, vomiting, diarrhea, extremity numbness/tingling/weakness, increased redness or pain to area, red streaking up arm, bleeding or drainage from area, worsening symptoms, or any additional concerns.

## 2017-07-13 NOTE — ED Provider Notes (Signed)
[facility] Emergency Department Provider Note  ED Clinical Impression   Cellulitis, unspecified cellulitis site   History   Chief Complaint Insect Bite   HPI  Patient is a 56 y.o. male with a PMH of hyperlipidemia who presents to ED with rash to left arm, concern for spider bite to area, onset 3 days ago, states initially was able to draw a moderate amount of pus from the area, but since then redness has remained. No active bleeding or drainage. No previous hx of abscesses. Unknown if hx of MRSA. Denies illicit drug use. Denies recent trauma or known injury to left arm. Left hand dominant. Denies fevers, chills, unexplained weight loss, dizziness, vision or gait changes, headaches, neck pain, CP, SOB, cough, abd pain, n/v/d, extremity numbness/tingling, extremity weakness, or any additional concerns. No recent travel, new soaps/lotions/shampoos/detergents, pets in house, recent landscape work, family or friends with similar symptoms, or any additional known exposures.   Past Medical History:  Diagnosis Date  . Hyperlipidemia   . Internal hemorrhoids   . Substance abuse     Past Surgical History:  Procedure Laterality Date  . COLONOSCOPY    . HEMORRHOID BANDING      Current Outpatient Rx  . Order #: [id number] Class: Normal  . Order #: [id number] Class: Historical Med    Allergies Patient has no known allergies.  Family History  Problem Relation Age of Onset  . Diabetes Mother   . Hypertension Mother   . Alcohol abuse Mother   . Diabetes Father   . Kidney disease Father   . Diabetes Sister   . Stroke Sister   . Diabetes Paternal Grandmother   . Cancer Paternal Grandfather        type unknown  . Colon cancer Neg Hx     Social History Social History  Substance Use Topics  . Smoking status: Former Research scientist (life sciences)  . Smokeless tobacco: Never Used  . Alcohol use Yes     Comment: socially    Review of Systems  Constitutional: Negative for fever, chills, or unexplained  weight loss. Eyes: Negative for visual changes. ENT: Negative for nasal congestion, ear pain, or sore throat. Cardiovascular: Negative for chest pain, palpitations, or extremity swelling. Respiratory: Negative for shortness of breath or cough. Gastrointestinal: Negative for abdominal pain, nausea, vomiting, or diarrhea. Musculoskeletal: Negative for back pain or extremity pain/swelling. Skin: +rash. Neurological: Negative for headaches, dizziness, focal weakness, or numbness/tingling.  Physical Exam   VITAL SIGNS:   ED Triage Vitals  Enc Vitals Group     BP 07/13/17 1233 135/78     Pulse Rate 07/13/17 1233 79     Resp 07/13/17 1233 17     Temp 07/13/17 1233 98.7 F (37.1 C)     Temp Source 07/13/17 1233 Oral     SpO2 07/13/17 1233 98 %     Weight 07/13/17 1234 176 lb (79.8 kg)     Height 07/13/17 1234 5\' 8"  (1.727 m)     Head Circumference --      Peak Flow --      Pain Score 07/13/17 1233 6     Pain Loc --      Pain Edu? --      Excl. in [place]? --     Constitutional: Alert and oriented. Well appearing and in no respiratory apparent distress. Eyes: PERRL, EOMI, Conjunctivae normal ENT      Head: Normocephalic and atraumatic.      Ears: TM intact bilaterally without erythema or effusion,  no hemotympanum, external ear canals normal.       Nose: No congestion.      Mouth/Throat: Mucous membranes are moist. Oropharynx without erythema or exudate. No trismus. Normal voice, handling secretions normally.      Neck: Supple, no nuchal signs, full active ROM of neck. Cardiovascular: Normal S1 S2, regular rhythm, normal rate. Normal and symmetric distal pulses are present in all extremities. Respiratory: Breath sounds clear and equal bilaterally. No wheezes, rales, or rhonchi. Normal respiratory effort.  Gastrointestinal: Abdomen soft and nontender. No rebound or guarding. Back: No midline tenderness, no stepoff.  Musculoskeletal: Nontender with normal range of motion in all  extremities. Full active ROM of left shoulder, elbow, wrist, and hand without pain. Compartments soft. Radial pulses 2+, cap refill < 2sec. Neurologic: Speech clear. Alert and appropriate, no gross focal neurologic deficits are appreciated. Gait steady with ambulation. Equal strength in all four extremities. Extremities neurovascularly intact.  Skin: +2cm x 2cm area of erythema with mild tenderness to palpation to dorsal aspect of left arm distal to elbow, no palpable fluctuance, no surrounding induration. No active bleeding or drainage. No central clearing. Skin is warm and dry.   Psychiatric: Mood and affect are normal. Speech and behavior are normal.  Labs   Labs Reviewed - No data to display  Radiology   No orders to display    ED Course, Assessment and Plan   Pt is a 56 y/o M who presents to ED for rash to left arm, concern for cellulitis. No petechia or purpura, no involvement of mucous membranes, no involvement of palms or soles of feet. Doubt Celene Kras Syndrome, Toxic Epidermal Necrolysis, staph scalded skin syndrome, RMSF, syphilis, Lyme Disease, or necrotizing soft tissue infection. Pt afebrile. Compartments soft, doubt compartment syndrome. No evidence of neurovascular compromise, doubt DVT. Full active ROM without pain, doubt fracture. Educational bedside ultrasound without abscess. Will plan to dc with antibiotics and PCP follow up for re-eval; pt amenable to this plan.  2:52 PM  Discussed results, discharge instructions, rx and safety, return precautions, and follow up. Pt verbalizes understanding using verbal teachback and agrees with plan, denies any additional concerns.   Previous chart, nursing notes, and vital signs reviewed.    Pertinent labs & imaging results that were available during my care of the patient were reviewed by me and considered in my medical decision making (see chart for details).     Seraiah Nowack, [place], NP 07/13/17 1559    Dorie Rank,  MD 07/16/17 [phone]

## 2017-07-13 NOTE — ED Notes (Signed)
Here for evaluation of insect bite to elbow

## 2017-09-03 ENCOUNTER — Encounter (INDEPENDENT_AMBULATORY_CARE_PROVIDER_SITE_OTHER): Payer: Self-pay | Admitting: Physician Assistant

## 2017-09-03 ENCOUNTER — Ambulatory Visit (INDEPENDENT_AMBULATORY_CARE_PROVIDER_SITE_OTHER): Payer: Self-pay | Admitting: Physician Assistant

## 2017-09-03 VITALS — BP 111/43 | HR 56 | Temp 98.1°F | Wt 175.8 lb

## 2017-09-03 DIAGNOSIS — R1012 Left upper quadrant pain: Secondary | ICD-10-CM

## 2017-09-03 MED ORDER — MELOXICAM 15 MG PO TABS: 15.0000 mg | ORAL_TABLET | Freq: Every day | ORAL | 0 refills | Status: DC

## 2017-09-03 NOTE — Patient Instructions (Signed)

## 2017-09-03 NOTE — Progress Notes (Signed)
Subjective:  Patient ID: Garrett Clark, male    DOB: [birth year]  Age: 56 y.o. MRN: [id number]  CC: left sided pain  HPI  Garrett Clark is a 56 y.o. male with a PMH of HLD, internal hemorrhoids, cocaine use, and LBP presents with LUQ abdominal pain. Onset several months ago. Waxing and waning fashion. Worse with torso in flexed position and better when supine. Normal colonoscopy in May 2018. Does not have associated f/c/n/v, distention, CP, SOB, HA, rash, swelling of LEs, or GI/GU sxs.    Outpatient Medications Prior to Visit  Medication Sig Dispense Refill  . atorvastatin (LIPITOR) 20 MG tablet Take 1 tablet (20 mg total) by mouth every evening. 90 tablet 1  . cephALEXin (KEFLEX) 500 MG capsule Take 1 capsule (500 mg total) by mouth 4 (four) times daily. 28 capsule 0  . ibuprofen (ADVIL,MOTRIN) 200 MG tablet Take 200 mg by mouth every 6 (six) hours as needed.     No facility-administered medications prior to visit.      ROS Review of Systems  Constitutional: Negative for chills, fever and malaise/fatigue.  Eyes: Negative for blurred vision.  Respiratory: Negative for shortness of breath.   Cardiovascular: Negative for chest pain and palpitations.  Gastrointestinal: Positive for abdominal pain. Negative for blood in stool, constipation, diarrhea, nausea and vomiting.  Genitourinary: Negative for dysuria and hematuria.  Musculoskeletal: Negative for joint pain and myalgias.  Skin: Negative for rash.  Neurological: Negative for tingling and headaches.  Psychiatric/Behavioral: Negative for depression. The patient is not nervous/anxious.     Objective:  BP (!) 111/43 (BP Location: Left Arm, Patient Position: Sitting, Cuff Size: Normal)   Pulse (!) 56   Temp 98.1 F (36.7 C) (Oral)   Wt 175 lb 12.8 oz (79.7 kg)   SpO2 95%   BMI 26.73 kg/m   BP/Weight 09/03/2017 07/13/2017 4/85/4627  Systolic BP 035 009 98  Diastolic BP 43 67 64  Wt. (Lbs) 175.8 176 176.5  BMI 26.73  26.76 26.84      Physical Exam  Constitutional: He is oriented to person, place, and time.  Well developed, well nourished, NAD, polite  HENT:  Head: Normocephalic and atraumatic.  Eyes: No scleral icterus.  Neck: Normal range of motion.  Cardiovascular: Normal rate, regular rhythm and normal heart sounds.   Pulmonary/Chest: Effort normal and breath sounds normal. No respiratory distress. He has no wheezes. He has no rales. He exhibits no tenderness.  Abdominal: Soft. Bowel sounds are normal. He exhibits no distension and no mass. There is no tenderness. There is no guarding.  Musculoskeletal: He exhibits no edema.  Neurological: He is alert and oriented to person, place, and time. No cranial nerve deficit. Coordination normal.  Skin: Skin is warm and dry. No rash noted. No erythema. No pallor.  Psychiatric: He has a normal mood and affect. His behavior is normal. Thought content normal.  Vitals reviewed.    Assessment & Plan:   1. Left upper quadrant pain - CT Abdomen Pelvis Wo Contrast; Future - Begin meloxicam (MOBIC) 15 MG tablet; Take 1 tablet (15 mg total) by mouth daily.  Dispense: 30 tablet; Refill: 0 - CBC with Differential - Lipase - Comprehensive metabolic panel   Meds ordered this encounter  Medications  . meloxicam (MOBIC) 15 MG tablet    Sig: Take 1 tablet (15 mg total) by mouth daily.    Dispense:  30 tablet    Refill:  0    Order Specific Question:  Supervising Provider    Answer:   Tresa Garter [[id number]]    Follow-up: 2 weeks for abdominal pain   Clent Demark PA

## 2017-09-03 NOTE — Progress Notes (Signed)
Pt states his left side pain comes and goes and sometimes while driving the pain is very intense

## 2017-09-04 LAB — CBC WITH DIFFERENTIAL/PLATELET
BASOS: 0 %
Basophils Absolute: 0 10*3/uL (ref 0.0–0.2)
EOS (ABSOLUTE): 0.3 10*3/uL (ref 0.0–0.4)
Eos: 6 %
Hematocrit: 45.1 % (ref 37.5–51.0)
Hemoglobin: 15.4 g/dL (ref 13.0–17.7)
IMMATURE GRANS (ABS): 0 10*3/uL (ref 0.0–0.1)
IMMATURE GRANULOCYTES: 0 %
LYMPHS: 34 %
Lymphocytes Absolute: 1.4 10*3/uL (ref 0.7–3.1)
MCH: 30.4 pg (ref 26.6–33.0)
MCHC: 34.1 g/dL (ref 31.5–35.7)
MCV: 89 fL (ref 79–97)
Monocytes Absolute: 0.2 10*3/uL (ref 0.1–0.9)
Monocytes: 6 %
NEUTROS PCT: 54 %
Neutrophils Absolute: 2.1 10*3/uL (ref 1.4–7.0)
PLATELETS: 322 10*3/uL (ref 150–379)
RBC: 5.06 x10E6/uL (ref 4.14–5.80)
RDW: 13.6 % (ref 12.3–15.4)
WBC: 3.9 10*3/uL (ref 3.4–10.8)

## 2017-09-04 LAB — COMPREHENSIVE METABOLIC PANEL
ALT: 11 IU/L (ref 0–44)
AST: 20 IU/L (ref 0–40)
Albumin/Globulin Ratio: 1.3 (ref 1.2–2.2)
Albumin: 4.4 g/dL (ref 3.5–5.5)
Alkaline Phosphatase: 80 IU/L (ref 39–117)
BUN/Creatinine Ratio: 10 (ref 9–20)
BUN: 12 mg/dL (ref 6–24)
Bilirubin Total: 1.3 mg/dL — ABNORMAL HIGH (ref 0.0–1.2)
CALCIUM: 9.7 mg/dL (ref 8.7–10.2)
CO2: 23 mmol/L (ref 20–29)
CREATININE: 1.23 mg/dL (ref 0.76–1.27)
Chloride: 103 mmol/L (ref 96–106)
GFR calc Af Amer: 76 mL/min/{1.73_m2} (ref >59)
GFR, EST NON AFRICAN AMERICAN: 66 mL/min/{1.73_m2} (ref >59)
GLOBULIN, TOTAL: 3.3 g/dL (ref 1.5–4.5)
GLUCOSE: 91 mg/dL (ref 65–99)
Potassium: 4.1 mmol/L (ref 3.5–5.2)
Sodium: 142 mmol/L (ref 134–144)
Total Protein: 7.7 g/dL (ref 6.0–8.5)

## 2017-09-04 LAB — LIPASE: LIPASE: 27 U/L (ref 13–78)

## 2017-09-07 ENCOUNTER — Encounter (INDEPENDENT_AMBULATORY_CARE_PROVIDER_SITE_OTHER): Payer: Self-pay

## 2017-09-10 ENCOUNTER — Ambulatory Visit (HOSPITAL_COMMUNITY)
Admission: RE | Admit: 2017-09-10 | Discharge: 2017-09-10 | Disposition: A | Discharge status: Home / Self Care | Payer: Self-pay | Source: Ambulatory Visit | Attending: Physician Assistant | Admitting: Physician Assistant

## 2017-09-10 DIAGNOSIS — R911 Solitary pulmonary nodule: Secondary | ICD-10-CM | POA: Insufficient documentation

## 2017-09-10 DIAGNOSIS — N289 Disorder of kidney and ureter, unspecified: Secondary | ICD-10-CM | POA: Insufficient documentation

## 2017-09-10 DIAGNOSIS — R1012 Left upper quadrant pain: Secondary | ICD-10-CM

## 2017-09-15 ENCOUNTER — Telehealth (INDEPENDENT_AMBULATORY_CARE_PROVIDER_SITE_OTHER): Payer: Self-pay

## 2017-09-15 NOTE — Telephone Encounter (Signed)
-----   Message from Clent Demark, PA-C sent at 09/14/2017  5:59 PM EDT ----- Needs MRI to further assess renal mass and hepatic cysts. Please make sure he returns to clinic for explanation within one to two weeks.

## 2017-09-15 NOTE — Telephone Encounter (Signed)
Patient given results, had come into clinic today to schedule FU. appt is 09/21/17. [name], CMA

## 2017-09-21 ENCOUNTER — Ambulatory Visit (INDEPENDENT_AMBULATORY_CARE_PROVIDER_SITE_OTHER): Payer: Self-pay | Admitting: Physician Assistant

## 2017-09-21 VITALS — BP 116/57 | HR 66 | Temp 98.6°F | Wt 180.2 lb

## 2017-09-21 DIAGNOSIS — K7689 Other specified diseases of liver: Secondary | ICD-10-CM

## 2017-09-21 DIAGNOSIS — R911 Solitary pulmonary nodule: Secondary | ICD-10-CM

## 2017-09-21 DIAGNOSIS — N2889 Other specified disorders of kidney and ureter: Secondary | ICD-10-CM

## 2017-09-21 DIAGNOSIS — J069 Acute upper respiratory infection, unspecified: Secondary | ICD-10-CM

## 2017-09-21 LAB — POCT URINALYSIS DIPSTICK
BILIRUBIN UA: NEGATIVE
GLUCOSE UA: NEGATIVE
Ketones, UA: NEGATIVE
Leukocytes, UA: NEGATIVE
NITRITE UA: NEGATIVE
PH UA: 6 (ref 5.0–8.0)
PROTEIN UA: NEGATIVE
Spec Grav, UA: 1.025 (ref 1.010–1.025)
UROBILINOGEN UA: 1 U/dL

## 2017-09-21 MED ORDER — PHENYLEPHRINE-DM-GG-APAP 5-10-200-325 MG/10ML PO LIQD: 20.0000 mL | ORAL | 0 refills | Status: AC

## 2017-09-21 NOTE — Progress Notes (Signed)
Subjective:  Patient ID: Garrett Clark, male    DOB: [birth year]  Age: 56 y.o. MRN: [id number]  CC: f/u abdominal pain  HPI  Garrett Clark is a 56 y.o. male with a medical history of HLD, internal hemorrhoids, cocaine use, and LBP presents with LUQ abdominal pain. Onset several months ago. Waxing and waning fashion. Worse with torso in flexed position and better when supine. Normal colonoscopy in May 2018.  Recent CT abdomen and pelvis revealed a 6.4 cm renal neoplasm, 6 mm right middle lobe nodule, and hepatic cysts. Does not have associated f/c/n/v, night sweats, weight loss, abdominal distention, CP, SOB, HA, rash, swelling of LEs, or GI/GU sxs.      Outpatient Medications Prior to Visit  Medication Sig Dispense Refill  . atorvastatin (LIPITOR) 20 MG tablet Take 1 tablet (20 mg total) by mouth every evening. 90 tablet 1  . ibuprofen (ADVIL,MOTRIN) 200 MG tablet Take 200 mg by mouth every 6 (six) hours as needed.    . meloxicam (MOBIC) 15 MG tablet Take 1 tablet (15 mg total) by mouth daily. 30 tablet 0  . cephALEXin (KEFLEX) 500 MG capsule Take 1 capsule (500 mg total) by mouth 4 (four) times daily. 28 capsule 0   No facility-administered medications prior to visit.      ROS Review of Systems  Constitutional: Negative for chills, fever and malaise/fatigue.  Eyes: Negative for blurred vision.  Respiratory: Negative for shortness of breath.   Cardiovascular: Negative for chest pain and palpitations.  Gastrointestinal: Positive for abdominal pain (LUQ). Negative for nausea.  Genitourinary: Negative for dysuria and hematuria.  Musculoskeletal: Negative for joint pain and myalgias.  Skin: Negative for rash.  Neurological: Negative for tingling and headaches.  Psychiatric/Behavioral: Negative for depression. The patient is not nervous/anxious.     Objective:  BP (!) 116/57 (BP Location: Left Arm, Patient Position: Sitting, Cuff Size: Large)   Pulse 66   Temp 98.6 F (37  C) (Oral)   Wt 180 lb 3.2 oz (81.7 kg)   SpO2 97%   BMI 27.40 kg/m   BP/Weight 09/21/2017 09/03/2017 7/51/7001  Systolic BP 749 449 675  Diastolic BP 57 43 67  Wt. (Lbs) 180.2 175.8 176  BMI 27.4 26.73 26.76      Physical Exam  Constitutional: He is oriented to person, place, and time.  Well developed, well nourished, NAD, polite  HENT:  Head: Normocephalic and atraumatic.  Eyes: Conjunctivae are normal. No scleral icterus.  Neck: Normal range of motion. Neck supple. No thyromegaly present.  Cardiovascular: Normal rate, regular rhythm and normal heart sounds.   Pulmonary/Chest: Effort normal and breath sounds normal.  Abdominal: Soft. Bowel sounds are normal. There is tenderness (mild LUQ on palpation).  Musculoskeletal: He exhibits no edema.  Neurological: He is alert and oriented to person, place, and time. No cranial nerve deficit. Coordination normal.  Skin: Skin is warm and dry. No rash noted. No erythema. No pallor.  Psychiatric: He has a normal mood and affect. His behavior is normal. Thought content normal.  Vitals reviewed.    Assessment & Plan:   1. Left renal mass - 6.4 cm mass, left kidney - MR ABDOMEN W CONTRAST; Future - Urinalysis Dipstick with trace intact blood today in clinic  2. Solitary pulmonary nodule - 6 mm right middle lobe nodule. Repeat CT in 3 months 3. Hepatic cyst - MR ABDOMEN W CONTRAST; Future - Hepatitis panel, acute - HIV antibody  4. Viral upper respiratory tract  infection - Phenylephrine-DM-GG-APAP (MUCINEX FAST-MAX COLD FLU) 5-10-200-325 MG/10ML LIQD; Take 20 mLs by mouth every 4 (four) hours.  Dispense: 1 Bottle; Refill: 0   Meds ordered this encounter  Medications  . Phenylephrine-DM-GG-APAP (MUCINEX FAST-MAX COLD FLU) 5-10-200-325 MG/10ML LIQD    Sig: Take 20 mLs by mouth every 4 (four) hours.    Dispense:  1 Bottle    Refill:  0    Order Specific Question:   Supervising Provider    Answer:   Tresa Garter  [id number]    Follow-up: Return in about 4 weeks (around 10/19/2017) for renal mass.   Clent Demark PA

## 2017-09-21 NOTE — Patient Instructions (Signed)
Renal Mass A renal mass is a growth in the kidney. Some masses are harmful and may cause cancer. Others are harmless. A renal mass may be solid or filled with fluid. Those that are filled with fluid are called cysts. What are the causes? Usually, the cause of a renal mass is unknown. However, certain types of cancers and infections can cause a renal mass. What are the signs or symptoms? Symptoms may include:  Blood in the urine.  Pain in the side or back (flank pain).  Feeling full soon after eating.  Weight loss.  Swelling in the abdomen.  Some renal masses do not cause symptoms. How is this diagnosed? A renal mass may be found with a CT scan, ultrasound, or MRI of your abdomen. How is this treated? Treatment will depend on the type of renal mass.  If the renal mass is a cyst that is not causing problems, you will not need treatment.  If the renal mass is a cyst that is causing problems, it may need to be drained during a type of surgery called laparoscopic surgery.  If the renal mass is solid, it may need to be removed with a surgery to your abdomen.  If the renal mass is caused by kidney cancer, you may need surgery to remove all or part of your kidney.  You may need to see your health care provider once or twice a year to have CT scans and ultrasounds done. Having these tests will allow your health care provider to see if your renal mass has changed or gotten bigger. Follow these instructions at home: What you need to do at home will depend on the type of renal mass that you have. The treatment you had also will make a difference. Follow the instructions your health care provider gives you. In general:  Keep all follow-up visits as directed by your health care provider.  Take medicines only as directed by your health care provider.  Contact a health care provider if:  You have abdominal pain.  You have flank pain.  You have a fever. Get help right away if:  Your  pain gets worse.  There is blood in your urine.  You cannot urinate.  You have chest pain.  You have trouble breathing. This information is not intended to replace advice given to you by your health care provider. Make sure you discuss any questions you have with your health care provider. Document Released: 07/05/2014 Document Revised: 05/13/2016 Document Reviewed: 02/01/2014 Elsevier Interactive Patient Education  [employer].

## 2017-09-22 ENCOUNTER — Telehealth (INDEPENDENT_AMBULATORY_CARE_PROVIDER_SITE_OTHER): Payer: Self-pay

## 2017-09-22 ENCOUNTER — Encounter (INDEPENDENT_AMBULATORY_CARE_PROVIDER_SITE_OTHER): Payer: Self-pay | Admitting: Physician Assistant

## 2017-09-22 LAB — HEPATITIS PANEL, ACUTE
HEP B C IGM: NEGATIVE
HEP B S AG: NEGATIVE
Hep A IgM: NEGATIVE
Hep C Virus Ab: <0.1 s/co ratio (ref 0.0–0.9)

## 2017-09-22 LAB — HIV ANTIBODY (ROUTINE TESTING W REFLEX): HIV SCREEN 4TH GENERATION: NONREACTIVE

## 2017-09-22 NOTE — Telephone Encounter (Signed)
-----   Message from Clent Demark, PA-C sent at 09/22/2017  1:44 PM EDT ----- All labs negative/normal.

## 2017-09-22 NOTE — Telephone Encounter (Signed)
Attempted to contact patient, he has a voicemail box that has not been set up, will try again. [name], CMA

## 2017-09-23 ENCOUNTER — Telehealth (INDEPENDENT_AMBULATORY_CARE_PROVIDER_SITE_OTHER): Payer: Self-pay

## 2017-09-23 NOTE — Telephone Encounter (Signed)
-----   Message from Clent Demark, PA-C sent at 09/22/2017  1:44 PM EDT ----- All labs negative/normal.

## 2017-09-23 NOTE — Telephone Encounter (Signed)
Patient provided with lab results. [name], CMA

## 2017-09-30 ENCOUNTER — Ambulatory Visit (HOSPITAL_COMMUNITY)
Admission: RE | Admit: 2017-09-30 | Discharge: 2017-09-30 | Disposition: A | Discharge status: Home / Self Care | Payer: Self-pay | Source: Ambulatory Visit | Attending: Physician Assistant | Admitting: Physician Assistant

## 2017-09-30 ENCOUNTER — Other Ambulatory Visit (INDEPENDENT_AMBULATORY_CARE_PROVIDER_SITE_OTHER): Payer: Self-pay | Admitting: Physician Assistant

## 2017-09-30 ENCOUNTER — Telehealth (INDEPENDENT_AMBULATORY_CARE_PROVIDER_SITE_OTHER): Payer: Self-pay

## 2017-09-30 DIAGNOSIS — N2889 Other specified disorders of kidney and ureter: Secondary | ICD-10-CM

## 2017-09-30 DIAGNOSIS — K7689 Other specified diseases of liver: Secondary | ICD-10-CM | POA: Insufficient documentation

## 2017-09-30 DIAGNOSIS — C642 Malignant neoplasm of left kidney, except renal pelvis: Secondary | ICD-10-CM

## 2017-09-30 DIAGNOSIS — Q446 Cystic disease of liver: Secondary | ICD-10-CM

## 2017-09-30 MED ORDER — GADOBENATE DIMEGLUMINE 529 MG/ML IV SOLN
20.0000 mL | Freq: Once | INTRAVENOUS | Status: AC
  Administered 2017-09-30: 17 mL via INTRAVENOUS

## 2017-09-30 NOTE — Telephone Encounter (Signed)
-----   Message from Clent Demark, PA-C sent at 09/30/2017 12:31 PM EDT ----- Renal cell carcinoma. Polycystic liver disease. I will place order for oncology and hepatology referral.

## 2017-09-30 NOTE — Telephone Encounter (Signed)
Patient provided with lab results and explained what the results meant. Patient aware of referral process. [name], CMA

## 2017-10-07 ENCOUNTER — Other Ambulatory Visit (INDEPENDENT_AMBULATORY_CARE_PROVIDER_SITE_OTHER): Payer: Self-pay | Admitting: Physician Assistant

## 2017-10-07 DIAGNOSIS — C642 Malignant neoplasm of left kidney, except renal pelvis: Secondary | ICD-10-CM

## 2017-10-12 ENCOUNTER — Encounter (INDEPENDENT_AMBULATORY_CARE_PROVIDER_SITE_OTHER): Payer: Self-pay | Admitting: Physician Assistant

## 2017-10-12 ENCOUNTER — Ambulatory Visit (INDEPENDENT_AMBULATORY_CARE_PROVIDER_SITE_OTHER): Payer: Self-pay | Admitting: Physician Assistant

## 2017-10-12 VITALS — BP 117/61 | HR 49 | Temp 98.3°F | Wt 183.8 lb

## 2017-10-12 DIAGNOSIS — R7989 Other specified abnormal findings of blood chemistry: Secondary | ICD-10-CM

## 2017-10-12 DIAGNOSIS — Q446 Cystic disease of liver: Secondary | ICD-10-CM

## 2017-10-12 DIAGNOSIS — C642 Malignant neoplasm of left kidney, except renal pelvis: Secondary | ICD-10-CM

## 2017-10-12 NOTE — Patient Instructions (Signed)
Bilirubin Test Why am I having this test? The bilirubin test is used to evaluate liver function. Your health care provider may recommend this test if you have hemolytic anemia. The test may also be done for a newborn who has jaundice. Bilirubin is produced when red blood cells are broken down. Normally, bilirubin is broken down in the liver and excreted as a component of bile. However, when red blood cells are broken down more quickly than usual, or when there is a dysfunction in how bile is excreted, bilirubin levels can become elevated. In newborns with jaundice, elevated bilirubin levels may put the child at risk for brain damage. What kind of sample is taken? This test can be performed using one of the following methods:  Blood sample. This is usually collected by inserting a needle into a vein.  Urine sample. This is collected using a sterile container that is given to you by the lab.  How do I prepare for this test? Fasting requirements for this test may vary among different labs. You may be asked not to eat or drink anything except water after midnight on the night before the test. What are the reference ranges? Reference ranges are considered healthy ranges established after testing a large group of healthy people. Reference ranges may vary among different people, labs, and hospitals. It is your responsibility to obtain your test results. Ask the lab or department performing the test when and how you will get your results. Reference ranges for blood samples:  Adult, child, or elderly: ? Total bilirubin: 0.3-1 mg/dL or 5.1-17 micromole/liter (SI units). ? Indirect bilirubin: 0.2-0.8 mg/dL or 3.4-12 micromole/liter (SI units). ? Direct bilirubin: 0.1-0.3 mg/dL or 1.7-5.1 micromole/liter (SI units).  Newborn total bilirubin: 1-12 mg/dL or 17.1-205 micromole/liter (SI units). Reference range for urine samples: 0-0.02 mg/dL. What do the results mean? Levels greater than the reference  ranges may indicate:  Gallstones.  Obstruction of the bile ducts.  Certain tumors of the liver.  Disorders that affect the breakdown and excretion of bilirubin.  Disorders that cause the destruction of red blood cells.  Liver diseases.  Reaction to certain medicines.  Reaction to blood transfusion.  Talk with your health care provider to discuss your results, treatment options, and if necessary, the need for more tests. Talk with your health care provider if you have any questions about your results. Talk with your health care provider to discuss your results, treatment options, and if necessary, the need for more tests. Talk with your health care provider if you have any questions about your results. This information is not intended to replace advice given to you by your health care provider. Make sure you discuss any questions you have with your health care provider. Document Released: 12/30/2004 Document Revised: 08/11/2016 Document Reviewed: 05/08/2014 Elsevier Interactive Patient Education  2017 [employer].

## 2017-10-12 NOTE — Progress Notes (Signed)
Subjective:  Patient ID: Garrett Clark, male    DOB: 1961-04-30  Age: 56 y.o. MRN: [id number]  CC: f/u left renal mass  HPI  Garrett Clark is a 56 y.o. male with a medical history of HLD, internal hemorrhoids, cocaine use,and LBP presents for f/u of MRI imaging. Originally sent for abdominal CT with the indication of LUQ abdominal pain. Finding of left renal mass on CT needed MRI for further characterization. MRI finding of left renal mass compatible with renal cell carcinoma. Incidentally,Innumerable tiny cysts of various sizes seen  throughout the liver favoring polycystic liver disease  Pt has been referred to oncology, hepatology, and urology. Urology appointment on 10/30/17. Has not heard from oncology or hepatology yet. Last CMP essentially normal except for slightly elevated bilirubin. Pt feels physically well but is somewhat worried about the findings of the MRI. Wants to be around "another 15 years" to see his 47 yo daughter grow.     Outpatient Medications Prior to Visit  Medication Sig Dispense Refill  . atorvastatin (LIPITOR) 20 MG tablet Take 1 tablet (20 mg total) by mouth every evening. 90 tablet 1  . ibuprofen (ADVIL,MOTRIN) 200 MG tablet Take 200 mg by mouth every 6 (six) hours as needed.    . meloxicam (MOBIC) 15 MG tablet Take 1 tablet (15 mg total) by mouth daily. 30 tablet 0   No facility-administered medications prior to visit.      ROS Review of Systems  Constitutional: Negative for chills, fever and malaise/fatigue.  Eyes: Negative for blurred vision.  Respiratory: Negative for shortness of breath.   Cardiovascular: Negative for chest pain and palpitations.  Gastrointestinal: Negative for abdominal pain and nausea.  Genitourinary: Negative for dysuria and hematuria.  Musculoskeletal: Negative for joint pain and myalgias.  Skin: Negative for rash.  Neurological: Negative for tingling and headaches.  Psychiatric/Behavioral: Negative for depression. The  patient is not nervous/anxious.     Objective:  BP 117/61 (BP Location: Left Arm, Patient Position: Sitting, Cuff Size: Normal)   Pulse (!) 49   Temp 98.3 F (36.8 C) (Oral)   Wt 183 lb 12.8 oz (83.4 kg)   SpO2 98%   BMI 27.95 kg/m   BP/Weight 10/12/2017 09/21/2017 5/70/1779  Systolic BP 390 300 923  Diastolic BP 61 57 43  Wt. (Lbs) 183.8 180.2 175.8  BMI 27.95 27.4 26.73      Physical Exam  Constitutional: He is oriented to person, place, and time.  Well developed, well nourished, NAD, polite  HENT:  Head: Normocephalic and atraumatic.  Eyes: No scleral icterus.  Cardiovascular: Normal rate, regular rhythm and normal heart sounds.   Pulmonary/Chest: Effort normal and breath sounds normal. No respiratory distress. He has no wheezes. He has no rales.  Abdominal: Soft. Bowel sounds are normal. There is tenderness (mild LUQ TTP).  Musculoskeletal: He exhibits no edema.  Neurological: He is alert and oriented to person, place, and time. No cranial nerve deficit. Coordination normal.  Skin: Skin is warm and dry. No rash noted. No erythema. No pallor.  Psychiatric: He has a normal mood and affect. His behavior is normal. Thought content normal.  Vitals reviewed.    Assessment & Plan:   1. Renal cell carcinoma of left kidney [facility]) - Referral to urology has been made  2. Polycystic liver disease - Referral to hepatology has been made  3. Abnormal bilirubin test - Bilirubin, fractionated(tot/dir/indir)     Follow-up: Return in about 8 weeks (around 12/07/2017).  Clent Demark PA

## 2017-10-13 ENCOUNTER — Ambulatory Visit: Payer: Self-pay

## 2017-10-13 ENCOUNTER — Telehealth: Payer: Self-pay

## 2017-10-13 LAB — BILIRUBIN, FRACTIONATED(TOT/DIR/INDIR)
BILIRUBIN INDIRECT: 0.57 mg/dL (ref 0.10–0.80)
BILIRUBIN TOTAL: 0.7 mg/dL (ref 0.0–1.2)
BILIRUBIN, DIRECT: 0.13 mg/dL (ref 0.00–0.40)

## 2017-10-13 NOTE — Telephone Encounter (Signed)
-----   Message from Clent Demark, PA-C sent at 10/13/2017  1:36 PM EDT ----- Bilirubin normal. Please notify patient.

## 2017-10-13 NOTE — Telephone Encounter (Signed)
Left patient a voicemail notifying of normal bilirubin. [name], CMA

## 2017-10-21 ENCOUNTER — Ambulatory Visit (INDEPENDENT_AMBULATORY_CARE_PROVIDER_SITE_OTHER): Payer: Self-pay | Admitting: Urology

## 2017-10-21 ENCOUNTER — Other Ambulatory Visit: Payer: Self-pay | Admitting: Urology

## 2017-10-21 ENCOUNTER — Ambulatory Visit (HOSPITAL_COMMUNITY)
Admission: RE | Admit: 2017-10-21 | Discharge: 2017-10-21 | Disposition: A | Discharge status: Home / Self Care | Payer: Self-pay | Source: Ambulatory Visit | Attending: Urology | Admitting: Urology

## 2017-10-21 ENCOUNTER — Other Ambulatory Visit (HOSPITAL_COMMUNITY)
Admission: RE | Admit: 2017-10-21 | Discharge: 2017-10-21 | Disposition: A | Discharge status: Home / Self Care | Payer: Self-pay | Source: Ambulatory Visit | Attending: Urology | Admitting: Urology

## 2017-10-21 DIAGNOSIS — D3000 Benign neoplasm of unspecified kidney: Secondary | ICD-10-CM | POA: Insufficient documentation

## 2017-10-21 DIAGNOSIS — D49512 Neoplasm of unspecified behavior of left kidney: Secondary | ICD-10-CM

## 2017-10-21 LAB — COMPREHENSIVE METABOLIC PANEL
ALBUMIN: 4.5 g/dL (ref 3.5–5.0)
ALK PHOS: 82 U/L (ref 38–126)
ALT: 21 U/L (ref 17–63)
AST: 26 U/L (ref 15–41)
Anion gap: 7 (ref 5–15)
BUN: 17 mg/dL (ref 6–20)
CALCIUM: 9.5 mg/dL (ref 8.9–10.3)
CO2: 29 mmol/L (ref 22–32)
CREATININE: 1.04 mg/dL (ref 0.61–1.24)
Chloride: 102 mmol/L (ref 101–111)
GFR calc Af Amer: >60 mL/min (ref >60)
GFR calc non Af Amer: >60 mL/min (ref >60)
GLUCOSE: 95 mg/dL (ref 65–99)
Potassium: 4.6 mmol/L (ref 3.5–5.1)
SODIUM: 138 mmol/L (ref 135–145)
Total Bilirubin: 1.5 mg/dL — ABNORMAL HIGH (ref 0.3–1.2)
Total Protein: 8.8 g/dL — ABNORMAL HIGH (ref 6.5–8.1)

## 2017-10-21 LAB — LACTATE DEHYDROGENASE: LDH: 160 U/L (ref 98–192)

## 2017-11-03 ENCOUNTER — Other Ambulatory Visit: Payer: Self-pay | Admitting: Urology

## 2017-11-25 ENCOUNTER — Encounter (HOSPITAL_COMMUNITY): Payer: Self-pay

## 2017-11-25 NOTE — Patient Instructions (Signed)
LANDY DUNNAVANT  11/25/2017   Your procedure is scheduled on: Monday, Dec. 17, 2018   Report to [facility] Main  Entrance   Take [place]  elevators to 3rd floor to  [place] at 5:30 AM.   Call this number if you have problems the morning of surgery [phone]    Remember: ONLY 1 PERSON MAY GO WITH YOU TO SHORT STAY TO GET  READY MORNING OF [place].   Do not eat food or drink liquids :After Midnight.    Take these medicines the morning of surgery with A SIP OF WATER: None             You may not have any metal on your body including jewelry              Do not wear lotions, powders or perfumes, deodorant                          Men may shave face and neck.   Do not bring valuables to the hospital. [place].   Contacts, dentures or bridgework may not be worn into surgery.   Leave suitcase in the car. After surgery it may be brought to your room.     CLEAR LIQUID DIET (The day before surgery clear liquid diet only)   Foods Allowed                                                                     Foods Excluded  Coffee and tea, regular and decaf                             liquids that you cannot  Plain Jell-O in any flavor                                             see through such as: Fruit ices (not with fruit pulp)                                     milk, soups, orange juice  Iced Popsicles                                    All solid food Carbonated beverages, regular and diet                                    Cranberry, grape and apple juices Sports drinks like Gatorade Lightly seasoned clear broth or consume(fat free) Sugar, honey syrup  Sample Menu Breakfast  Lunch                                     Supper Cranberry juice                    Beef broth                            Chicken broth Jell-O                                     Grape  juice                           Apple juice Coffee or tea                        Jell-O                                      Popsicle                                                Coffee or tea                        Coffee or tea  _____________________________________________________________________   Drink one bottle of Magnesium Citrate by noon the day before surgery!!                Please read over the following fact sheets you were given: _____________________________________________________________________             [facility] - Preparing for Surgery Before surgery, you can play an important role.  Because skin is not sterile, your skin needs to be as free of germs as possible.  You can reduce the number of germs on your skin by washing with CHG (chlorahexidine gluconate) soap before surgery.  CHG is an antiseptic cleaner which kills germs and bonds with the skin to continue killing germs even after washing. Please DO NOT use if you have an allergy to CHG or antibacterial soaps.  If your skin becomes reddened/irritated stop using the CHG and inform your nurse when you arrive at Short Stay. Do not shave (including legs and underarms) for at least 48 hours prior to the first CHG shower.  You may shave your face/neck.  Please follow these instructions carefully:  1.  Shower with CHG Soap the night before surgery and the  morning of surgery.  2.  If you choose to wash your hair, wash your hair first as usual with your normal  shampoo.  3.  After you shampoo, rinse your hair and body thoroughly to remove the shampoo.                             4.  Use CHG as you would any other liquid soap.  You can apply chg directly to the skin and wash.  Gently with a scrungie or clean  washcloth.  5.  Apply the CHG Soap to your body ONLY FROM THE NECK DOWN.   Do   not use on face/ open                           Wound or open sores. Avoid contact with eyes, ears mouth and   genitals (private  parts).                       Wash face,  Genitals (private parts) with your normal soap.             6.  Wash thoroughly, paying special attention to the area where your    surgery  will be performed.  7.  Thoroughly rinse your body with warm water from the neck down.  8.  DO NOT shower/wash with your normal soap after using and rinsing off the CHG Soap.                9.  Pat yourself dry with a clean towel.            10.  Wear clean pajamas.            11.  Place clean sheets on your bed the night of your first shower and do not  sleep with pets. Day of Surgery : Do not apply any lotions/deodorants the morning of surgery.  Please wear clean clothes to the hospital/surgery center.  FAILURE TO FOLLOW THESE INSTRUCTIONS MAY RESULT IN THE CANCELLATION OF YOUR SURGERY  PATIENT SIGNATURE_________________________________  NURSE SIGNATURE__________________________________  ________________________________________________________________________  WHAT IS A BLOOD TRANSFUSION? Blood Transfusion Information  A transfusion is the replacement of blood or some of its parts. Blood is made up of multiple cells which provide different functions.  Red blood cells carry oxygen and are used for blood loss replacement.  White blood cells fight against infection.  Platelets control bleeding.  Plasma helps clot blood.  Other blood products are available for specialized needs, such as hemophilia or other clotting disorders. BEFORE THE TRANSFUSION  Who gives blood for transfusions?   Healthy volunteers who are fully evaluated to make sure their blood is safe. This is blood bank blood. Transfusion therapy is the safest it has ever been in the practice of medicine. Before blood is taken from a donor, a complete history is taken to make sure that person has no history of diseases nor engages in risky social behavior (examples are intravenous drug use or sexual activity with multiple partners). The donor's  travel history is screened to minimize risk of transmitting infections, such as malaria. The donated blood is tested for signs of infectious diseases, such as HIV and hepatitis. The blood is then tested to be sure it is compatible with you in order to minimize the chance of a transfusion reaction. If you or a relative donates blood, this is often done in anticipation of surgery and is not appropriate for emergency situations. It takes many days to process the donated blood. RISKS AND COMPLICATIONS Although transfusion therapy is very safe and saves many lives, the main dangers of transfusion include:   Getting an infectious disease.  Developing a transfusion reaction. This is an allergic reaction to something in the blood you were given. Every precaution is taken to prevent this. The decision to have a blood transfusion has been considered carefully by your caregiver before blood is given. Blood is not given unless the  benefits outweigh the risks. AFTER THE TRANSFUSION  Right after receiving a blood transfusion, you will usually feel much better and more energetic. This is especially true if your red blood cells have gotten low (anemic). The transfusion raises the level of the red blood cells which carry oxygen, and this usually causes an energy increase.  The nurse administering the transfusion will monitor you carefully for complications. HOME CARE INSTRUCTIONS  No special instructions are needed after a transfusion. You may find your energy is better. Speak with your caregiver about any limitations on activity for underlying diseases you may have. SEEK MEDICAL CARE IF:   Your condition is not improving after your transfusion.  You develop redness or irritation at the intravenous (IV) site. SEEK IMMEDIATE MEDICAL CARE IF:  Any of the following symptoms occur over the next 12 hours:  Shaking chills.  You have a temperature by mouth above 102 F (38.9 C), not controlled by  medicine.  Chest, back, or muscle pain.  People around you feel you are not acting correctly or are confused.  Shortness of breath or difficulty breathing.  Dizziness and fainting.  You get a rash or develop hives.  You have a decrease in urine output.  Your urine turns a dark color or changes to pink, red, or brown. Any of the following symptoms occur over the next 10 days:  You have a temperature by mouth above 102 F (38.9 C), not controlled by medicine.  Shortness of breath.  Weakness after normal activity.  The white part of the eye turns yellow (jaundice).  You have a decrease in the amount of urine or are urinating less often.  Your urine turns a dark color or changes to pink, red, or brown. Document Released: 12/05/2000 Document Revised: 03/01/2012 Document Reviewed: 07/24/2008 [facility] Patient Information 2014 [employer], [place].  _______________________________________________________________________

## 2017-11-25 NOTE — Pre-Procedure Instructions (Signed)
The following are in epic: EKG 4/4 18 CXR 10/21/17 MRI abd 09/30/17 CT abd/pelvis 09/11/17 Last office visit note Dr. Altamease Oiler 10/12/17

## 2017-12-03 ENCOUNTER — Encounter (HOSPITAL_COMMUNITY)
Admission: RE | Admit: 2017-12-03 | Discharge: 2017-12-03 | Disposition: A | Discharge status: Home / Self Care | Payer: Self-pay | Source: Ambulatory Visit | Attending: Urology | Admitting: Urology

## 2017-12-03 ENCOUNTER — Encounter (HOSPITAL_COMMUNITY): Payer: Self-pay

## 2017-12-03 ENCOUNTER — Other Ambulatory Visit: Payer: Self-pay

## 2017-12-03 DIAGNOSIS — N289 Disorder of kidney and ureter, unspecified: Secondary | ICD-10-CM | POA: Insufficient documentation

## 2017-12-03 DIAGNOSIS — Z01812 Encounter for preprocedural laboratory examination: Secondary | ICD-10-CM | POA: Insufficient documentation

## 2017-12-03 HISTORY — DX: Cystic disease of liver: Q44.6

## 2017-12-03 HISTORY — DX: Malignant (primary) neoplasm, unspecified: C80.1

## 2017-12-03 LAB — COMPREHENSIVE METABOLIC PANEL
ALT: 17 U/L (ref 17–63)
ANION GAP: 8 (ref 5–15)
AST: 31 U/L (ref 15–41)
Albumin: 4.1 g/dL (ref 3.5–5.0)
Alkaline Phosphatase: 72 U/L (ref 38–126)
BUN: 14 mg/dL (ref 6–20)
CHLORIDE: 107 mmol/L (ref 101–111)
CO2: 24 mmol/L (ref 22–32)
CREATININE: 1 mg/dL (ref 0.61–1.24)
Calcium: 9.4 mg/dL (ref 8.9–10.3)
Glucose, Bld: 92 mg/dL (ref 65–99)
POTASSIUM: 4.3 mmol/L (ref 3.5–5.1)
SODIUM: 139 mmol/L (ref 135–145)
Total Bilirubin: 1.2 mg/dL (ref 0.3–1.2)
Total Protein: 8.3 g/dL — ABNORMAL HIGH (ref 6.5–8.1)

## 2017-12-03 LAB — CBC
HCT: 42.5 % (ref 39.0–52.0)
Hemoglobin: 14.7 g/dL (ref 13.0–17.0)
MCH: 31.2 pg (ref 26.0–34.0)
MCHC: 34.6 g/dL (ref 30.0–36.0)
MCV: 90.2 fL (ref 78.0–100.0)
PLATELETS: 301 10*3/uL (ref 150–400)
RBC: 4.71 MIL/uL (ref 4.22–5.81)
RDW: 13.2 % (ref 11.5–15.5)
WBC: 4.6 10*3/uL (ref 4.0–10.5)

## 2017-12-03 LAB — ABO/RH: ABO/RH(D): O POS

## 2017-12-06 NOTE — Anesthesia Preprocedure Evaluation (Addendum)
Anesthesia Evaluation  Patient identified by MRN, date of birth, ID band Patient awake    Reviewed: Allergy & Precautions, NPO status , Patient's Chart, lab work & pertinent test results  Airway Mallampati: II  TM Distance: >3 FB Neck ROM: Full    Dental  (+) Edentulous Lower, Edentulous Upper   Pulmonary former smoker,    Pulmonary exam normal breath sounds clear to auscultation       Cardiovascular negative cardio ROS Normal cardiovascular exam Rhythm:Regular Rate:Normal  ECG: SB, rate 50   Neuro/Psych negative neurological ROS  negative psych ROS   GI/Hepatic negative GI ROS, (+)     substance abuse  ,   Endo/Other  negative endocrine ROS  Renal/GU negative Renal ROS     Musculoskeletal negative musculoskeletal ROS (+)   Abdominal   Peds  Hematology HLD   Anesthesia Other Findings LEFT REANL MASS  Reproductive/Obstetrics                            Anesthesia Physical Anesthesia Plan  ASA: II  Anesthesia Plan: General   Post-op Pain Management:    Induction: Intravenous  PONV Risk Score and Plan: 3 and Midazolam, Dexamethasone, Ondansetron and Treatment may vary due to age or medical condition  Airway Management Planned: Oral ETT  Additional Equipment:   Intra-op Plan:   Post-operative Plan: Extubation in OR  Informed Consent: I have reviewed the patients History and Physical, chart, labs and discussed the procedure including the risks, benefits and alternatives for the proposed anesthesia with the patient or authorized representative who has indicated his/her understanding and acceptance.   Dental advisory given  Plan Discussed with: CRNA  Anesthesia Plan Comments: (Possible arterial line discussed)       Anesthesia Quick Evaluation

## 2017-12-07 ENCOUNTER — Encounter (HOSPITAL_COMMUNITY): Payer: Self-pay | Admitting: *Deleted

## 2017-12-07 ENCOUNTER — Inpatient Hospital Stay (HOSPITAL_COMMUNITY): Payer: Medicaid Other | Admitting: Anesthesiology

## 2017-12-07 ENCOUNTER — Encounter (HOSPITAL_COMMUNITY)
Admission: RE | Disposition: A | Discharge status: Home / Self Care | Payer: Self-pay | Source: Ambulatory Visit | Attending: Urology

## 2017-12-07 ENCOUNTER — Other Ambulatory Visit: Payer: Self-pay

## 2017-12-07 ENCOUNTER — Inpatient Hospital Stay (HOSPITAL_COMMUNITY)
Admission: RE | Admit: 2017-12-07 | Discharge: 2017-12-09 | DRG: 657 | Disposition: A | Discharge status: Home / Self Care | Payer: Medicaid Other | Source: Ambulatory Visit | Attending: Urology | Admitting: Urology

## 2017-12-07 DIAGNOSIS — K660 Peritoneal adhesions (postprocedural) (postinfection): Secondary | ICD-10-CM | POA: Diagnosis present

## 2017-12-07 DIAGNOSIS — Z833 Family history of diabetes mellitus: Secondary | ICD-10-CM | POA: Diagnosis not present

## 2017-12-07 DIAGNOSIS — Z811 Family history of alcohol abuse and dependence: Secondary | ICD-10-CM

## 2017-12-07 DIAGNOSIS — Z823 Family history of stroke: Secondary | ICD-10-CM

## 2017-12-07 DIAGNOSIS — Q446 Cystic disease of liver: Secondary | ICD-10-CM | POA: Diagnosis not present

## 2017-12-07 DIAGNOSIS — Z8249 Family history of ischemic heart disease and other diseases of the circulatory system: Secondary | ICD-10-CM | POA: Diagnosis not present

## 2017-12-07 DIAGNOSIS — D7812 Accidental puncture and laceration of the spleen during other procedure: Secondary | ICD-10-CM | POA: Diagnosis present

## 2017-12-07 DIAGNOSIS — Z87891 Personal history of nicotine dependence: Secondary | ICD-10-CM

## 2017-12-07 DIAGNOSIS — E785 Hyperlipidemia, unspecified: Secondary | ICD-10-CM | POA: Diagnosis present

## 2017-12-07 DIAGNOSIS — C642 Malignant neoplasm of left kidney, except renal pelvis: Secondary | ICD-10-CM | POA: Diagnosis present

## 2017-12-07 DIAGNOSIS — Y658 Other specified misadventures during surgical and medical care: Secondary | ICD-10-CM | POA: Diagnosis not present

## 2017-12-07 DIAGNOSIS — Z841 Family history of disorders of kidney and ureter: Secondary | ICD-10-CM | POA: Diagnosis not present

## 2017-12-07 DIAGNOSIS — Y92234 Operating room of hospital as the place of occurrence of the external cause: Secondary | ICD-10-CM | POA: Diagnosis not present

## 2017-12-07 DIAGNOSIS — N2889 Other specified disorders of kidney and ureter: Secondary | ICD-10-CM | POA: Diagnosis present

## 2017-12-07 HISTORY — PX: ROBOT ASSISTED LAPAROSCOPIC NEPHRECTOMY: SHX5140

## 2017-12-07 LAB — TYPE AND SCREEN
ABO/RH(D): O POS
ANTIBODY SCREEN: NEGATIVE

## 2017-12-07 LAB — HEMOGLOBIN AND HEMATOCRIT, BLOOD
HEMATOCRIT: 44.1 % (ref 39.0–52.0)
Hemoglobin: 15.3 g/dL (ref 13.0–17.0)

## 2017-12-07 SURGERY — NEPHRECTOMY, RADICAL, ROBOT-ASSISTED, LAPAROSCOPIC, ADULT
Anesthesia: General | Laterality: Left

## 2017-12-07 MED ORDER — SUGAMMADEX SODIUM 200 MG/2ML IV SOLN
INTRAVENOUS | Status: DC | PRN
  Administered 2017-12-07: 200 mg via INTRAVENOUS

## 2017-12-07 MED ORDER — HYDROMORPHONE HCL 1 MG/ML IJ SOLN
INTRAMUSCULAR | Status: AC
  Administered 2017-12-07: 0.5 mg via INTRAVENOUS
  Filled 2017-12-07: qty 1

## 2017-12-07 MED ORDER — HYDROCODONE-ACETAMINOPHEN 5-325 MG PO TABS: 1.0000 | ORAL_TABLET | Freq: Four times a day (QID) | ORAL | 0 refills | Status: DC | PRN

## 2017-12-07 MED ORDER — BUPIVACAINE LIPOSOME 1.3 % IJ SUSP
20.0000 mL | Freq: Once | INTRAMUSCULAR | Status: AC
  Administered 2017-12-07: 20 mL
  Filled 2017-12-07: qty 20

## 2017-12-07 MED ORDER — HYDROMORPHONE HCL 1 MG/ML IJ SOLN
0.5000 mg | INTRAMUSCULAR | Status: DC | PRN
  Administered 2017-12-07: 1 mg via INTRAVENOUS
  Administered 2017-12-07: 0.5 mg via INTRAVENOUS
  Filled 2017-12-07: qty 1

## 2017-12-07 MED ORDER — HYDRALAZINE HCL 20 MG/ML IJ SOLN
INTRAMUSCULAR | Status: AC
  Administered 2017-12-07: 5 mg via INTRAVENOUS
  Filled 2017-12-07: qty 1

## 2017-12-07 MED ORDER — PROPOFOL 10 MG/ML IV BOLUS
INTRAVENOUS | Status: DC | PRN
  Administered 2017-12-07: 160 mg via INTRAVENOUS

## 2017-12-07 MED ORDER — ONDANSETRON HCL 4 MG/2ML IJ SOLN: 4.0000 mg | INTRAMUSCULAR | Status: DC | PRN

## 2017-12-07 MED ORDER — ROCURONIUM BROMIDE 50 MG/5ML IV SOSY
PREFILLED_SYRINGE | INTRAVENOUS | Status: AC
  Filled 2017-12-07: qty 5

## 2017-12-07 MED ORDER — LACTATED RINGERS IR SOLN
Status: DC | PRN
  Administered 2017-12-07: 1000 mL

## 2017-12-07 MED ORDER — ONDANSETRON HCL 4 MG/2ML IJ SOLN
INTRAMUSCULAR | Status: DC | PRN
  Administered 2017-12-07: 4 mg via INTRAVENOUS

## 2017-12-07 MED ORDER — HYDROMORPHONE HCL 1 MG/ML IJ SOLN
INTRAMUSCULAR | Status: AC
  Administered 2017-12-07: 0.5 mg via INTRAVENOUS
  Filled 2017-12-07: qty 2

## 2017-12-07 MED ORDER — ATORVASTATIN CALCIUM 20 MG PO TABS
20.0000 mg | ORAL_TABLET | Freq: Every evening | ORAL | Status: DC
  Administered 2017-12-07 – 2017-12-08 (×2): 20 mg via ORAL
  Filled 2017-12-07 (×2): qty 1

## 2017-12-07 MED ORDER — DEXTROSE-NACL 5-0.45 % IV SOLN
INTRAVENOUS | Status: DC
  Administered 2017-12-07: 1000 mL via INTRAVENOUS
  Administered 2017-12-08: 03:00:00 via INTRAVENOUS

## 2017-12-07 MED ORDER — MAGNESIUM CITRATE PO SOLN
1.0000 | Freq: Once | ORAL | Status: DC
  Filled 2017-12-07: qty 296

## 2017-12-07 MED ORDER — STERILE WATER FOR IRRIGATION IR SOLN
Status: DC | PRN
  Administered 2017-12-07: 1000 mL

## 2017-12-07 MED ORDER — LACTATED RINGERS IV SOLN
INTRAVENOUS | Status: DC | PRN
  Administered 2017-12-07 (×3): via INTRAVENOUS

## 2017-12-07 MED ORDER — OXYCODONE HCL 5 MG/5ML PO SOLN
5.0000 mg | Freq: Once | ORAL | Status: DC | PRN
  Filled 2017-12-07: qty 5

## 2017-12-07 MED ORDER — LIDOCAINE 2% (20 MG/ML) 5 ML SYRINGE
INTRAMUSCULAR | Status: AC
  Filled 2017-12-07: qty 5

## 2017-12-07 MED ORDER — LIDOCAINE 2% (20 MG/ML) 5 ML SYRINGE
INTRAMUSCULAR | Status: DC | PRN
  Administered 2017-12-07: 60 mg via INTRAVENOUS

## 2017-12-07 MED ORDER — MIDAZOLAM HCL 5 MG/5ML IJ SOLN
INTRAMUSCULAR | Status: DC | PRN
  Administered 2017-12-07: 2 mg via INTRAVENOUS

## 2017-12-07 MED ORDER — HYDROMORPHONE HCL 1 MG/ML IJ SOLN
0.2500 mg | INTRAMUSCULAR | Status: DC | PRN
  Administered 2017-12-07 (×4): 0.5 mg via INTRAVENOUS

## 2017-12-07 MED ORDER — ONDANSETRON HCL 4 MG/2ML IJ SOLN
INTRAMUSCULAR | Status: AC
  Filled 2017-12-07: qty 2

## 2017-12-07 MED ORDER — PROPOFOL 10 MG/ML IV BOLUS
INTRAVENOUS | Status: AC
  Filled 2017-12-07: qty 40

## 2017-12-07 MED ORDER — ROCURONIUM BROMIDE 10 MG/ML (PF) SYRINGE
PREFILLED_SYRINGE | INTRAVENOUS | Status: DC | PRN
  Administered 2017-12-07: 20 mg via INTRAVENOUS
  Administered 2017-12-07: 50 mg via INTRAVENOUS

## 2017-12-07 MED ORDER — DEXAMETHASONE SODIUM PHOSPHATE 10 MG/ML IJ SOLN
INTRAMUSCULAR | Status: DC | PRN
  Administered 2017-12-07: 5 mg via INTRAVENOUS

## 2017-12-07 MED ORDER — FENTANYL CITRATE (PF) 250 MCG/5ML IJ SOLN
INTRAMUSCULAR | Status: AC
  Filled 2017-12-07: qty 5

## 2017-12-07 MED ORDER — HYDRALAZINE HCL 20 MG/ML IJ SOLN
5.0000 mg | Freq: Once | INTRAMUSCULAR | Status: AC
  Administered 2017-12-07: 5 mg via INTRAVENOUS

## 2017-12-07 MED ORDER — SODIUM CHLORIDE 0.9 % IJ SOLN
INTRAMUSCULAR | Status: AC
  Filled 2017-12-07: qty 20

## 2017-12-07 MED ORDER — HYDROMORPHONE HCL 1 MG/ML IJ SOLN
0.5000 mg | INTRAMUSCULAR | Status: DC | PRN
  Administered 2017-12-07 (×2): 0.5 mg via INTRAVENOUS

## 2017-12-07 MED ORDER — DIPHENHYDRAMINE HCL 50 MG/ML IJ SOLN
12.5000 mg | Freq: Four times a day (QID) | INTRAMUSCULAR | Status: DC | PRN
  Administered 2017-12-07: 12.5 mg via INTRAVENOUS
  Filled 2017-12-07: qty 1

## 2017-12-07 MED ORDER — SODIUM CHLORIDE 0.9 % IJ SOLN
INTRAMUSCULAR | Status: DC | PRN
  Administered 2017-12-07: 20 mL

## 2017-12-07 MED ORDER — DIPHENHYDRAMINE HCL 12.5 MG/5ML PO ELIX: 12.5000 mg | ORAL_SOLUTION | Freq: Four times a day (QID) | ORAL | Status: DC | PRN

## 2017-12-07 MED ORDER — CEFAZOLIN SODIUM-DEXTROSE 2-4 GM/100ML-% IV SOLN
INTRAVENOUS | Status: AC
  Filled 2017-12-07: qty 100

## 2017-12-07 MED ORDER — OXYCODONE HCL 5 MG PO TABS: 5.0000 mg | ORAL_TABLET | Freq: Once | ORAL | Status: DC | PRN

## 2017-12-07 MED ORDER — HYDROCODONE-ACETAMINOPHEN 5-325 MG PO TABS
1.0000 | ORAL_TABLET | ORAL | Status: DC | PRN
  Administered 2017-12-07: 2 via ORAL
  Administered 2017-12-08: 1 via ORAL
  Administered 2017-12-08 (×2): 2 via ORAL
  Administered 2017-12-08: 1 via ORAL
  Administered 2017-12-09: 2 via ORAL
  Filled 2017-12-07 (×2): qty 2
  Filled 2017-12-07 (×2): qty 1
  Filled 2017-12-07 (×2): qty 2

## 2017-12-07 MED ORDER — FENTANYL CITRATE (PF) 100 MCG/2ML IJ SOLN
INTRAMUSCULAR | Status: DC | PRN
  Administered 2017-12-07 (×7): 50 ug via INTRAVENOUS
  Administered 2017-12-07: 100 ug via INTRAVENOUS
  Administered 2017-12-07: 50 ug via INTRAVENOUS

## 2017-12-07 MED ORDER — ACETAMINOPHEN 325 MG PO TABS: 650.0000 mg | ORAL_TABLET | ORAL | Status: DC | PRN

## 2017-12-07 MED ORDER — CEFAZOLIN SODIUM-DEXTROSE 2-4 GM/100ML-% IV SOLN
2.0000 g | INTRAVENOUS | Status: AC
  Administered 2017-12-07: 2 g via INTRAVENOUS

## 2017-12-07 MED ORDER — PROMETHAZINE HCL 25 MG/ML IJ SOLN: 6.2500 mg | INTRAMUSCULAR | Status: DC | PRN

## 2017-12-07 MED ORDER — MIDAZOLAM HCL 2 MG/2ML IJ SOLN
INTRAMUSCULAR | Status: AC
  Filled 2017-12-07: qty 2

## 2017-12-07 SURGICAL SUPPLY — 56 items
APPLICATOR SURGIFLO ENDO (HEMOSTASIS) ×3 IMPLANT
BAG LAPAROSCOPIC 12 15 PORT 16 (BASKET) IMPLANT
BAG RETRIEVAL 12/15 (BASKET)
BAG RETRIEVAL 12/15MM (BASKET)
CHLORAPREP W/TINT 26ML (MISCELLANEOUS) ×3 IMPLANT
CLIP VESOLOCK LG 6/CT PURPLE (CLIP) ×9 IMPLANT
CLIP VESOLOCK MED LG 6/CT (CLIP) ×6 IMPLANT
CLIP VESOLOCK XL 6/CT (CLIP) ×3 IMPLANT
COVER SURGICAL LIGHT HANDLE (MISCELLANEOUS) ×3 IMPLANT
COVER TIP SHEARS 8 DVNC (MISCELLANEOUS) ×1 IMPLANT
COVER TIP SHEARS 8MM DA VINCI (MISCELLANEOUS) ×2
CUTTER ECHEON FLEX ENDO 45 340 (ENDOMECHANICALS) ×3 IMPLANT
DECANTER SPIKE VIAL GLASS SM (MISCELLANEOUS) ×3 IMPLANT
DERMABOND ADVANCED (GAUZE/BANDAGES/DRESSINGS) ×2
DERMABOND ADVANCED .7 DNX12 (GAUZE/BANDAGES/DRESSINGS) ×1 IMPLANT
DRAPE ARM DVNC X/XI (DISPOSABLE) ×4 IMPLANT
DRAPE COLUMN DVNC XI (DISPOSABLE) ×1 IMPLANT
DRAPE DA VINCI XI ARM (DISPOSABLE) ×8
DRAPE DA VINCI XI COLUMN (DISPOSABLE) ×2
DRAPE INCISE IOBAN 66X45 STRL (DRAPES) ×3 IMPLANT
DRAPE SHEET LG 3/4 BI-LAMINATE (DRAPES) ×3 IMPLANT
ELECT PENCIL ROCKER SW 15FT (MISCELLANEOUS) ×3 IMPLANT
ELECT REM PT RETURN 15FT ADLT (MISCELLANEOUS) ×3 IMPLANT
FLOSEAL 10ML (HEMOSTASIS) ×3 IMPLANT
GLOVE BIO SURGEON STRL SZ 6.5 (GLOVE) ×2 IMPLANT
GLOVE BIO SURGEON STRL SZ8 (GLOVE) ×6 IMPLANT
GLOVE BIO SURGEONS STRL SZ 6.5 (GLOVE) ×1
GLOVE BIOGEL PI IND STRL 8 (GLOVE) ×1 IMPLANT
GLOVE BIOGEL PI INDICATOR 8 (GLOVE) ×2
GOWN STRL REUS W/TWL LRG LVL3 (GOWN DISPOSABLE) ×9 IMPLANT
HEMOSTAT SURGICEL 4X8 (HEMOSTASIS) ×3 IMPLANT
IRRIG SUCT STRYKERFLOW 2 WTIP (MISCELLANEOUS) ×3
IRRIGATION SUCT STRKRFLW 2 WTP (MISCELLANEOUS) ×1 IMPLANT
KIT BASIN OR (CUSTOM PROCEDURE TRAY) ×3 IMPLANT
LOOP VESSEL MAXI BLUE (MISCELLANEOUS) IMPLANT
NEEDLE INSUFFLATION 14GA 120MM (NEEDLE) ×3 IMPLANT
POSITIONER SURGICAL ARM (MISCELLANEOUS) ×6 IMPLANT
RELOAD STAPLER WHITE 60MM (STAPLE) IMPLANT
SEAL CANN UNIV 5-8 DVNC XI (MISCELLANEOUS) ×4 IMPLANT
SEAL XI 5MM-8MM UNIVERSAL (MISCELLANEOUS) ×8
SOLUTION ELECTROLUBE (MISCELLANEOUS) ×3 IMPLANT
SPONGE LAP 4X18 X RAY DECT (DISPOSABLE) ×3 IMPLANT
STAPLE ECHEON FLEX 60 POW ENDO (STAPLE) IMPLANT
STAPLE RELOAD 45 WHT (STAPLE) ×3 IMPLANT
STAPLE RELOAD 45MM WHITE (STAPLE) ×6
STAPLER RELOAD WHITE 60MM (STAPLE)
SUT ETHILON 3 0 PS 1 (SUTURE) IMPLANT
SUT MNCRL AB 4-0 PS2 18 (SUTURE) ×6 IMPLANT
SUT PDS AB 1 TP1 96 (SUTURE) ×6 IMPLANT
SUT VICRYL 0 UR6 27IN ABS (SUTURE) ×3 IMPLANT
TAPE CLOTH 4X10 WHT NS (GAUZE/BANDAGES/DRESSINGS) IMPLANT
TOWEL OR NON WOVEN STRL DISP B (DISPOSABLE) ×3 IMPLANT
TRAY FOLEY W/METER SILVER 16FR (SET/KITS/TRAYS/PACK) ×3 IMPLANT
TRAY LAPAROSCOPIC (CUSTOM PROCEDURE TRAY) ×3 IMPLANT
TROCAR XCEL 12X100 BLDLESS (ENDOMECHANICALS) ×3 IMPLANT
WATER STERILE IRR 1000ML POUR (IV SOLUTION) ×6 IMPLANT

## 2017-12-07 NOTE — H&P (Signed)
Urology Admission H&P  Chief Complaint: left renal mass  History of Present Illness: Garrett Clark is a 56yo with a 7cm left renal mass found on CT scan.   Past Medical History:  Diagnosis Date  . Cancer ([place])    Left kidney  . Hyperlipidemia   . Internal hemorrhoids   . Polycystic liver disease   . Substance abuse [facility])    Past Surgical History:  Procedure Laterality Date  . COLONOSCOPY    . HEMORRHOID BANDING      Home Medications:  Current Facility-Administered Medications  Medication Dose Route Frequency Provider Last Rate Last Dose  . bupivacaine liposome (EXPAREL) 1.3 % injection 266 mg  20 mL Infiltration Once Cleon Gustin, MD      . ceFAZolin (ANCEF) 2-4 GM/100ML-% IVPB           . ceFAZolin (ANCEF) IVPB 2g/100 mL premix  2 g Intravenous 30 min Pre-Op McKenzie, Candee Furbish, MD      . magnesium citrate solution 1 Bottle  1 Bottle Oral Once McKenzie, Candee Furbish, MD       Allergies: No Known Allergies  Family History  Problem Relation Age of Onset  . Diabetes Mother   . Hypertension Mother   . Alcohol abuse Mother   . Diabetes Father   . Kidney disease Father   . Diabetes Sister   . Stroke Sister   . Diabetes Paternal Grandmother   . Cancer Paternal Grandfather        type unknown  . Colon cancer Neg Hx    Social History:  reports that he quit smoking about 5 years ago. His smoking use included cigarettes. he has never used smokeless tobacco. He reports that he drinks alcohol. He reports that he uses drugs. Drug: Marijuana.  Review of Systems  All other systems reviewed and are negative.   Physical Exam:  Vital signs in last 24 hours: Temp:  [97.8 F (36.6 C)] 97.8 F (36.6 C) (12/17 0532) Pulse Rate:  [49] 49 (12/17 0532) Resp:  [16] 16 (12/17 0532) BP: (124)/(86) 124/86 (12/17 0532) SpO2:  [100 %] 100 % (12/17 0532) Physical Exam  Constitutional: He is oriented to person, place, and time. He appears well-developed and well-nourished.  HENT:  Head:  Normocephalic and atraumatic.  Eyes: EOM are normal. Pupils are equal, round, and reactive to light.  Neck: Normal range of motion. No thyromegaly present.  Cardiovascular: Normal rate and regular rhythm.  Respiratory: Effort normal. No respiratory distress.  GI: Soft. He exhibits no distension.  Musculoskeletal: Normal range of motion. He exhibits no edema.  Neurological: He is alert and oriented to person, place, and time.  Skin: Skin is warm and dry.  Psychiatric: He has a normal mood and affect. His behavior is normal. Judgment and thought content normal.    Laboratory Data:  No results found for this or any previous visit (from the past 24 hour(s)). No results found for this or any previous visit (from the past 240 hour(s)). Creatinine: Recent Labs    12/03/17 0837  CREATININE 1.00   Baseline Creatinine: 1  Impression/Assessment:  56yo with left renal mass concerning for malignancy  Plan:  The risks/benefits/alternatives to left radical nephrectomy was explained to the patient and he understands and wishes to proceed with surgery  Nicolette Bang 12/07/2017, 7:34 AM

## 2017-12-07 NOTE — Anesthesia Procedure Notes (Signed)
Procedure Name: Intubation Performed by: Sierra Spargo J, CRNA Pre-anesthesia Checklist: Patient identified, Emergency Drugs available, Suction available, Patient being monitored and Timeout performed Patient Re-evaluated:Patient Re-evaluated prior to induction Oxygen Delivery Method: Circle system utilized Preoxygenation: Pre-oxygenation with 100% oxygen Induction Type: IV induction Ventilation: Mask ventilation without difficulty Laryngoscope Size: Mac and 4 Grade View: Grade I Tube type: Oral Tube size: 7.5 mm Number of attempts: 1 Airway Equipment and Method: Stylet Placement Confirmation: ETT inserted through vocal cords under direct vision,  positive ETCO2,  CO2 detector and breath sounds checked- equal and bilateral Secured at: 23 cm Tube secured with: Tape Dental Injury: Teeth and Oropharynx as per pre-operative assessment        

## 2017-12-07 NOTE — Anesthesia Postprocedure Evaluation (Signed)
Anesthesia Post Note  Patient: Garrett Clark  Procedure(s) Performed: XI ROBOTIC ASSISTED LAPAROSCOPIC NEPHRECTOMY (Left )     Patient location during evaluation: PACU Anesthesia Type: General Level of consciousness: awake and alert Pain management: pain level controlled Vital Signs Assessment: post-procedure vital signs reviewed and stable Respiratory status: spontaneous breathing, nonlabored ventilation, respiratory function stable and patient connected to nasal cannula oxygen Cardiovascular status: blood pressure returned to baseline and stable Postop Assessment: no apparent nausea or vomiting Anesthetic complications: no    Last Vitals:  Vitals:   12/07/17 1400 12/07/17 1500  BP: (!) 153/91   Pulse: 90 (!) 101  Resp: 11 16  Temp:  37 C  SpO2: 99% 100%    Last Pain:  Vitals:   12/07/17 1315  TempSrc:   PainSc: 0-No pain                 Ryan P Ellender

## 2017-12-07 NOTE — Progress Notes (Signed)
Post-op note  S/p L robotic nx. Small splenic laceration.   Subjective: The patient is doing well.  No complaints. Minima pain.  Post op hgb 15 Slightly tachy in pacu   Objective: Vital signs in last 24 hours: Temp:  [97.3 F (36.3 C)-98.4 F (36.9 C)] 98.4 F (36.9 C) (12/17 1315) Pulse Rate:  [43-91] 90 (12/17 1400) Resp:  [11-18] 11 (12/17 1400) BP: (124-219)/(77-115) 153/91 (12/17 1400) SpO2:  [99 %-100 %] 99 % (12/17 1400)  Intake/Output from previous day: No intake/output data recorded. Intake/Output this shift: Total I/O In: 2600 [I.V.:2600] Out: 300 [Urine:200; Blood:100]  Physical Exam:  General: Alert and oriented. Abdomen: Soft, Nondistended, incision c/d/i Incisions: Clean and dry. GU: foley catheter to drainage, draining clear urine  Lab Results: Recent Labs    12/07/17 1139  HGB 15.3  HCT 44.1    Assessment/Plan: POD#0 s/p L robotic Nx  1) Continue to monitor 2) PRN oxycodone  3) Bed rest x 24 hours  4) IVF  5) Clears  6) Continue fol;ey   Alla Feeling, MD, MD   LOS: 0 days   Alla Feeling, MD 12/07/2017, 2:47 PM

## 2017-12-07 NOTE — Op Note (Signed)
Preoperative diagnosis: Left renal mass  Postop diagnosis: Same  Procedure: 1.  Left robot assisted laparoscopic radical nephrectomy   Attending: Nicolette Bang, MD  Resident: Fredrik Rigger, MD  Assistant: Debbrah Alar, PA  Anesthesia: General  Estimated blood loss: 100 cc  Drains: 16 French Foley catheter  Specimens: Left radical nephrectomy  Antibiotics: ancef  Findings: 2 renal veins and 1 renal artery.  Indications: Patient is a 56 year old with a history of 7 cm left renal mass.  The mass was not amenable to partial nephrectomy.  After discussing treatment options patient decided to proceed with left robot assisted laparoscopic radical nephrectomy.  Procedure in detail: Prior to procedure consent was obtained. Patient was brought to the operating room and briefing was done sure correct patient, correct procedure, correct site.  General anesthesia was in administered patient was placed in the right lateral decubitus position.  a 68 French catheter was placed. their abdomen and flank was then prepped and draped usual sterile fashion.  A Veress needle was used to obtain pneumoperitoneum.  Once pneumoperitoneum was reestablished to 15 mmHg we then placed a 12 mm camera port lateral to the umbilicus at the latera; edge of rectus.  We then proceeded to place 3 more robotic ports. We then placed an assistant port. We then docked the robot.  We then started this dissection by removing a extensive amount of anterior abdominal wall adhesions.  We then dissected along the white line of [place].  We then reflected the colon medially.  We then identified the psoas muscle.  Once this was done we traced it down to the iliac vessels and identified the ureter.  Once we identified the gonadal vein and ureter were then traced this to the renal hilum.  The renal vein and renal artery were skeletonized.  We did we identified one renal vein one renal artery.  Using the [employer] power stapler within ligated  the renal artery.  Once this was done we then used a second staple load to ligate the renal vein.  We then used a tissue load to ligate the gonadal vein and the ureter.  Once this was done we then freed the kidney from its lateral and posterior attachments.  We then used a Endo Catch bag to remove the specimen.  Once the specimen was in the Endo Catch bag we then inspected the retroperitoneum and noted no residual bleeding.  We then removed our instruments, undocked the robot, and released the pneumoperitoneum.  We then made a midline incision above the umbilicus to remove the specimen.  Once the specimen was removed we then closed the camera and assistant ports with 0 Vicryl in interrupted fashion.  We then closed the midline incision with looped PDS in a running fashion.  We then closed the overlying skin with 2-0 Vicryl in running fashion.  These skin was then subcuticularly closed with 4-0 Monocryl.  We then placed Dermabond over all the incisions.  This included the procedure which resulted by the patient. The assistant was utilized in this case for retraction and passing instruments into and out of the operative field  Complications: None  Condition: Stable, extubated, transferred to PACU.  Plan: Patient is to be admitted for inpatient stay. The foley catheter will be removed in the morning. They will be started on a clear liquid diet POD#1

## 2017-12-07 NOTE — Transfer of Care (Signed)
Immediate Anesthesia Transfer of Care Note  Patient: Garrett Clark  Procedure(s) Performed: XI ROBOTIC ASSISTED LAPAROSCOPIC NEPHRECTOMY (Left )  Patient Location: PACU  Anesthesia Type:General  Level of Consciousness: sedated, patient cooperative and responds to stimulation  Airway & Oxygen Therapy: Patient Spontanous Breathing and Patient connected to face mask oxygen  Post-op Assessment: Report given to RN and Post -op Vital signs reviewed and stable  Post vital signs: Reviewed and stable  Last Vitals:  Vitals:   12/07/17 0532  BP: 124/86  Pulse: (!) 49  Resp: 16  Temp: 36.6 C  SpO2: 100%    Last Pain:  Vitals:   12/07/17 0532  TempSrc: Oral         Complications: No apparent anesthesia complications

## 2017-12-07 NOTE — Discharge Instructions (Signed)

## 2017-12-08 ENCOUNTER — Encounter (HOSPITAL_COMMUNITY): Payer: Self-pay | Admitting: Urology

## 2017-12-08 LAB — BASIC METABOLIC PANEL
ANION GAP: 7 (ref 5–15)
BUN: 11 mg/dL (ref 6–20)
CHLORIDE: 103 mmol/L (ref 101–111)
CO2: 27 mmol/L (ref 22–32)
Calcium: 8.9 mg/dL (ref 8.9–10.3)
Creatinine, Ser: 1.72 mg/dL — ABNORMAL HIGH (ref 0.61–1.24)
GFR, EST AFRICAN AMERICAN: 50 mL/min — AB (ref >60)
GFR, EST NON AFRICAN AMERICAN: 43 mL/min — AB (ref >60)
Glucose, Bld: 123 mg/dL — ABNORMAL HIGH (ref 65–99)
POTASSIUM: 4.3 mmol/L (ref 3.5–5.1)
SODIUM: 137 mmol/L (ref 135–145)

## 2017-12-08 LAB — HEMOGLOBIN AND HEMATOCRIT, BLOOD
HEMATOCRIT: 41.8 % (ref 39.0–52.0)
HEMOGLOBIN: 14.6 g/dL (ref 13.0–17.0)

## 2017-12-08 MED ORDER — SODIUM CHLORIDE 0.9% FLUSH: 3.0000 mL | INTRAVENOUS | Status: DC | PRN

## 2017-12-08 MED ORDER — SODIUM CHLORIDE 0.9% FLUSH
3.0000 mL | Freq: Two times a day (BID) | INTRAVENOUS | Status: DC
  Administered 2017-12-08: 3 mL via INTRAVENOUS

## 2017-12-08 MED ORDER — SODIUM CHLORIDE 0.9 % IV SOLN: 250.0000 mL | INTRAVENOUS | Status: DC | PRN

## 2017-12-08 NOTE — Progress Notes (Signed)
Urology Progress Note   1 Day Post-Op  Subjective: NAEON.  Pain tolerated  No f/c/n/v Labs wnl  Objective: Vital signs in last 24 hours: Temp:  [97.3 F (36.3 C)-99.2 F (37.3 C)] 98.8 F (37.1 C) (12/18 0423) Pulse Rate:  [43-101] 48 (12/18 0423) Resp:  [11-18] 18 (12/18 0423) BP: (140-219)/(49-115) 140/69 (12/18 0423) SpO2:  [99 %-100 %] 100 % (12/18 0423) Weight:  [79.9 kg (176 lb 2.4 oz)] 79.9 kg (176 lb 2.4 oz) (12/17 1908)  Intake/Output from previous day: 12/17 0701 - 12/18 0700 In: 3225 [I.V.:3225] Out: 2900 [Urine:2800; Blood:100] Intake/Output this shift: No intake/output data recorded.  Physical Exam:  General: Alert and oriented CV: RRR Lungs: Clear Abdomen: Soft, appropriately tender. Incision c/d/i GU: Foley in place draining clear yellow urine  Ext: NT, No erythema  Lab Results: Recent Labs    12/07/17 1139 12/08/17 0450  HGB 15.3 14.6  HCT 44.1 41.8   BMET Recent Labs    12/08/17 0450  NA 137  K 4.3  CL 103  CO2 27  GLUCOSE 123*  BUN 11  CREATININE 1.72*  CALCIUM 8.9     Studies/Results: No results found.  Assessment/Plan:  56 y.o. male s/p L robotic Nx.  Overall doing well post-op.   - Regular diet - [place]  - DC foley catheter  - PRN oxycodone, morphine   Dispo: likely tomorrow    LOS: 1 day   Alla Feeling, MD 12/08/2017, 7:42 AM

## 2017-12-09 LAB — BASIC METABOLIC PANEL
ANION GAP: 8 (ref 5–15)
BUN: 13 mg/dL (ref 6–20)
CHLORIDE: 100 mmol/L — AB (ref 101–111)
CO2: 28 mmol/L (ref 22–32)
Calcium: 9.4 mg/dL (ref 8.9–10.3)
Creatinine, Ser: 1.78 mg/dL — ABNORMAL HIGH (ref 0.61–1.24)
GFR calc Af Amer: 48 mL/min — ABNORMAL LOW (ref >60)
GFR, EST NON AFRICAN AMERICAN: 42 mL/min — AB (ref >60)
GLUCOSE: 120 mg/dL — AB (ref 65–99)
POTASSIUM: 4 mmol/L (ref 3.5–5.1)
Sodium: 136 mmol/L (ref 135–145)

## 2017-12-09 LAB — HEMOGLOBIN AND HEMATOCRIT, BLOOD
HEMATOCRIT: 41.6 % (ref 39.0–52.0)
Hemoglobin: 14.7 g/dL (ref 13.0–17.0)

## 2017-12-09 MED ORDER — BISACODYL 10 MG RE SUPP
10.0000 mg | Freq: Every day | RECTAL | Status: DC | PRN
  Administered 2017-12-09: 10 mg via RECTAL
  Filled 2017-12-09: qty 1

## 2017-12-09 NOTE — Discharge Summary (Signed)
[facility] Discharge Summary  Admit date: 12/07/2017  Discharge date and time: 12/09/17   Discharge to: Home  Discharge Service: Urology  Discharge Attending Physician:  McKenzie  Discharge  Diagnoses: <principal problem not specified>  Secondary Diagnosis: Active Problems:   Renal mass   OR Procedures: Procedure(s): XI ROBOTIC ASSISTED LAPAROSCOPIC NEPHRECTOMY 12/07/2017   Ancillary Procedures: None   Discharge Day Services: The patient was seen and examined by the Urology team both in the morning and immediately prior to discharge.  Vital signs and laboratory values were stable and within normal limits.  The physical exam was benign and unchanged and all surgical wounds were examined.  Discharge instructions were explained and all questions answered.  Subjective  No acute events overnight. Pain Controlled. No fever or chills.  Objective Patient Vitals for the past 8 hrs:  BP Temp Temp src Pulse Resp SpO2  12/09/17 0539 122/73 98.7 F (37.1 C) Oral 72 20 99 %   No intake/output data recorded.  General Appearance:        No acute distress Lungs:                       Normal work of breathing on room air Heart:                                Regular rate and rhythm Abdomen:                         Soft, non-tender, non-distended Extremities:                      Warm and well perfused   Hospital Course:  The patient underwent left robotic Nephrectomy on 12/07/2017.  The patient tolerated the procedure well, was extubated in the OR, and afterwards was taken to the PACU for routine post-surgical care. When stable the patient was transferred to the floor.   The patient did well postoperatively.  The patient's diet was slowly advanced and at the time of discharge was tolerating a regular diet.  The patient was discharged home 2 Days Post-Op, at which point was tolerating a regular solid diet, was able to void spontaneously, have adequate pain control with P.O. pain  medication, and could ambulate without difficulty. The patient will follow up with [place] for post op check.   Condition at Discharge: Improved  Discharge Medications:  Allergies as of 12/09/2017   No Known Allergies     Medication List    STOP taking these medications   ibuprofen 200 MG tablet Commonly known as:  ADVIL,MOTRIN   meloxicam 15 MG tablet Commonly known as:  MOBIC     TAKE these medications   acetaminophen 500 MG tablet Commonly known as:  TYLENOL Take 1,000 mg by mouth every 6 (six) hours as needed.   atorvastatin 20 MG tablet Commonly known as:  LIPITOR Take 1 tablet (20 mg total) by mouth every evening.   HYDROcodone-acetaminophen 5-325 MG tablet Commonly known as:  NORCO Take 1-2 tablets by mouth every 6 (six) hours as needed for moderate pain or severe pain.

## 2017-12-10 ENCOUNTER — Other Ambulatory Visit (INDEPENDENT_AMBULATORY_CARE_PROVIDER_SITE_OTHER): Payer: Self-pay | Admitting: Physician Assistant

## 2017-12-10 DIAGNOSIS — C642 Malignant neoplasm of left kidney, except renal pelvis: Secondary | ICD-10-CM

## 2017-12-11 ENCOUNTER — Telehealth: Payer: Self-pay | Admitting: Oncology

## 2017-12-11 NOTE — Telephone Encounter (Signed)
Spoke with patien re 1/15 new patient appointment with Dr. Alen Blew @ 2pm to arrive 1:30pm. Patient given date/time/location. Demographic/insurance information confirmed.   Appointment confirmation sent to referring via Epic.

## 2017-12-23 ENCOUNTER — Ambulatory Visit (INDEPENDENT_AMBULATORY_CARE_PROVIDER_SITE_OTHER): Payer: Self-pay | Admitting: Urology

## 2017-12-23 DIAGNOSIS — C642 Malignant neoplasm of left kidney, except renal pelvis: Secondary | ICD-10-CM

## 2017-12-30 ENCOUNTER — Other Ambulatory Visit: Payer: Self-pay

## 2017-12-30 ENCOUNTER — Ambulatory Visit (INDEPENDENT_AMBULATORY_CARE_PROVIDER_SITE_OTHER): Payer: Self-pay | Admitting: Physician Assistant

## 2017-12-30 ENCOUNTER — Encounter (INDEPENDENT_AMBULATORY_CARE_PROVIDER_SITE_OTHER): Payer: Self-pay | Admitting: Physician Assistant

## 2017-12-30 VITALS — BP 164/83 | HR 51 | Temp 98.1°F | Wt 176.0 lb

## 2017-12-30 DIAGNOSIS — R29898 Other symptoms and signs involving the musculoskeletal system: Secondary | ICD-10-CM

## 2017-12-30 NOTE — Patient Instructions (Signed)
Myasthenia Gravis  Myasthenia gravis (MG) means severe weakness. It is a long-term (chronic) condition that causes weakness in the muscles you can control (voluntary muscles). MG can affect any voluntary muscle. The muscles most often affected are the ones that control:   Eye movement.   Facial movements.   Swallowing.    MG is an autoimmune disease, which means that your body's defense system (immune system) attacks healthy parts of your body instead of germs and other things that make you sick. When you have MG, your immune system makes proteins (antibodies) that block the chemical (acetylcholine) your body needs to send nerve signals to your muscles. This causes muscle weakness.  What are the causes?  The exact cause of MG is unknown. One possible cause is an enlarged thymus gland, which is located under your breastbone.  What are the signs or symptoms?  The earliest symptom of MG is muscle weakness that gets worse with activity and gets better after rest. Other symptoms of MG may include:   Drooping eyelids.   Double vision.   Loss of facial expression.   Trouble chewing and swallowing.   Slurred speech.   A waddling walk.   Weakness of the arms, hands, and legs.    Trouble breathing is the most dangerous symptom of MG. Sudden and severe difficulty breathing (myasthenic crisis) may require emergency breathing support. This symptom sometimes happens after:   Infection.   Fever.   Drug reaction.    How is this diagnosed?  It can be hard to diagnose MG because muscle weakness is a common symptom in many conditions. Your health care provider will do a physical exam. You may also have tests that will help make a diagnosis. These may include:   A blood test.   A test using the medicine edrophonium. This medicine increases muscle strength by slowing the breakdown of acetylcholine.   Tests to measure nerve conduction to muscle (electromyography).   An imaging study of the chest (CT or MRI).    How is  this treated?  Treatment can improve muscle strength. Sometimes symptoms of MG go away for a while (remission) and you can stop treatment. Possible treatments include:   Medicine.   Removal of the thymus gland (thymectomy). This may result in a long remission for some people.    Follow these instructions at home:   Take medicines only as directed by your health care provider.   Get plenty of rest to conserve your energy.   Take frequent breaks to rest your eyes.   Maintain a healthy diet and a healthy weight.   Do not use any tobacco products including cigarettes, chewing tobacco, or electronic cigarettes. If you need help quitting, ask your health care provider.   Keep all follow-up visits as directed by your health care provider. This is important.  Contact a health care provider if:   Your symptoms get worse after a fever or infection.   You have a reaction to a medicine you are taking.   Your symptoms change or get worse.  Get help right away if:  You have trouble breathing.  This information is not intended to replace advice given to you by your health care provider. Make sure you discuss any questions you have with your health care provider.  Document Released: 03/16/2001 Document Revised: 05/15/2016 Document Reviewed: 02/08/2014  Elsevier Interactive Patient Education  2018 Elsevier Inc[employer]

## 2017-12-30 NOTE — Progress Notes (Signed)
Pt complains of not being able to hold on to anything  Pt complains of pain in left arm from fingers to shoulders
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
## 2017-12-31 LAB — COMPREHENSIVE METABOLIC PANEL
ALBUMIN: 4.3 g/dL (ref 3.5–5.5)
ALT: 19 IU/L (ref 0–44)
AST: 21 IU/L (ref 0–40)
Albumin/Globulin Ratio: 1 — ABNORMAL LOW (ref 1.2–2.2)
Alkaline Phosphatase: 97 IU/L (ref 39–117)
BUN / CREAT RATIO: 10 (ref 9–20)
BUN: 16 mg/dL (ref 6–24)
Bilirubin Total: 0.2 mg/dL (ref 0.0–1.2)
CO2: 26 mmol/L (ref 20–29)
CREATININE: 1.66 mg/dL — AB (ref 0.76–1.27)
Calcium: 9.8 mg/dL (ref 8.7–10.2)
Chloride: 99 mmol/L (ref 96–106)
GFR calc Af Amer: 53 mL/min/{1.73_m2} — ABNORMAL LOW (ref >59)
GFR, EST NON AFRICAN AMERICAN: 46 mL/min/{1.73_m2} — AB (ref >59)
GLOBULIN, TOTAL: 4.3 g/dL (ref 1.5–4.5)
GLUCOSE: 60 mg/dL — AB (ref 65–99)
Potassium: 4.8 mmol/L (ref 3.5–5.2)
SODIUM: 139 mmol/L (ref 134–144)
Total Protein: 8.6 g/dL — ABNORMAL HIGH (ref 6.0–8.5)

## 2017-12-31 LAB — TSH: TSH: 0.752 u[IU]/mL (ref 0.450–4.500)

## 2017-12-31 LAB — ACETYLCHOLINE RECEPTOR, BINDING: AChR Binding Ab, Serum: <0.03 nmol/L (ref 0.00–0.24)

## 2018-01-04 ENCOUNTER — Encounter: Payer: Self-pay | Admitting: Neurology

## 2018-01-04 ENCOUNTER — Telehealth (INDEPENDENT_AMBULATORY_CARE_PROVIDER_SITE_OTHER): Payer: Self-pay

## 2018-01-04 NOTE — Telephone Encounter (Signed)
Patient voicemail box full, unable to leave message. Will attempt to call again before mailing results. [name], CMA

## 2018-01-04 NOTE — Telephone Encounter (Signed)
-----   Message from Clent Demark, PA-C sent at 01/04/2018 12:46 PM EST ----- No sign of Myasthenia gravis. Rest of labs are stable.

## 2018-01-05 ENCOUNTER — Ambulatory Visit: Payer: Self-pay | Admitting: Oncology

## 2018-01-05 ENCOUNTER — Telehealth (INDEPENDENT_AMBULATORY_CARE_PROVIDER_SITE_OTHER): Payer: Self-pay

## 2018-01-05 NOTE — Telephone Encounter (Signed)
-----   Message from Clent Demark, PA-C sent at 01/04/2018 12:46 PM EST ----- No sign of Myasthenia gravis. Rest of labs are stable.

## 2018-01-05 NOTE — Telephone Encounter (Signed)
Patient is aware of labs not showing signs of myasthenia gravis and all other labs stable. [name], CMA

## 2018-01-26 ENCOUNTER — Encounter (INDEPENDENT_AMBULATORY_CARE_PROVIDER_SITE_OTHER): Payer: Self-pay | Admitting: Physician Assistant

## 2018-01-26 ENCOUNTER — Ambulatory Visit (INDEPENDENT_AMBULATORY_CARE_PROVIDER_SITE_OTHER): Payer: Self-pay | Admitting: Physician Assistant

## 2018-01-26 VITALS — BP 113/67 | HR 58 | Temp 97.9°F | Resp 18 | Ht 66.0 in | Wt 183.0 lb

## 2018-01-26 DIAGNOSIS — R29898 Other symptoms and signs involving the musculoskeletal system: Secondary | ICD-10-CM

## 2018-01-26 MED ORDER — GABAPENTIN 100 MG PO CAPS: 100.0000 mg | ORAL_CAPSULE | Freq: Three times a day (TID) | ORAL | 3 refills | Status: DC

## 2018-01-26 MED ORDER — PREDNISONE 10 MG PO TABS: 10.0000 mg | ORAL_TABLET | Freq: Every day | ORAL | 0 refills | Status: AC

## 2018-01-26 NOTE — Patient Instructions (Signed)
Shoulder Impingement Syndrome Shoulder impingement syndrome is a condition that causes pain when connective tissues (tendons) surrounding the shoulder joint become pinched. These tendons are part of the group of muscles and tissues that help to stabilize the shoulder (rotator cuff). Beneath the rotator cuff is a fluid-filled sac (bursa) that allows the muscles and tendons to glide smoothly. The bursa may become swollen or irritated (bursitis). Bursitis, swelling in the rotator cuff tendons, or both conditions can decrease how much space is under a bone in the shoulder joint (acromion), resulting in impingement. What are the causes? Shoulder impingement syndrome can be caused by bursitis or swelling of the rotator cuff tendons, which may result from:  Repetitive overhead arm movements.  Falling onto the shoulder.  Weakness in the shoulder muscles.  What increases the risk? You may be more likely to develop this condition if you are an athlete who participates in:  Sports that involve throwing, such as baseball.  Tennis.  Swimming.  Volleyball.  Some people are also more likely to develop impingement syndrome because of the shape of their acromion bone. What are the signs or symptoms? The main symptom of this condition is pain on the front or side of the shoulder. Pain may:  Get worse when lifting or raising the arm.  Get worse at night.  Wake you up from sleeping.  Feel sharp when the shoulder is moved, and then fade to an ache.  Other signs and symptoms may include:  Tenderness.  Stiffness.  Inability to raise the arm above shoulder level or behind the body.  Weakness.  How is this diagnosed? This condition may be diagnosed based on:  Your symptoms.  Your medical history.  A physical exam.  Imaging tests, such as: ? X-rays. ? MRI. ? Ultrasound.  How is this treated? Treatment for this condition may include:  Resting your shoulder and avoiding all  activities that cause pain or put stress on the shoulder.  Icing your shoulder.  NSAIDs to help reduce pain and swelling.  One or more injections of medicines to numb the area and reduce inflammation.  Physical therapy.  Surgery. This may be needed if nonsurgical treatments have not helped. Surgery may involve repairing the rotator cuff, reshaping the acromion, or removing the bursa.  Follow these instructions at home: Managing pain, stiffness, and swelling  If directed, apply ice to the injured area. ? Put ice in a plastic bag. ? Place a towel between your skin and the bag. ? Leave the ice on for 20 minutes, 2-3 times a day. Activity  Rest and return to your normal activities as told by your health care provider. Ask your health care provider what activities are safe for you.  Do exercises as told by your health care provider. General instructions  Do not use any tobacco products, including cigarettes, chewing tobacco, or e-cigarettes. Tobacco can delay healing. If you need help quitting, ask your health care provider.  Ask your health care provider when it is safe for you to drive.  Take over-the-counter and prescription medicines only as told by your health care provider.  Keep all follow-up visits as told by your health care provider. This is important. How is this prevented?  Give your body time to rest between periods of activity.  Be safe and responsible while being active to avoid falls.  Maintain physical fitness, including strength and flexibility. Contact a health care provider if:  Your symptoms have not improved after 1-2 months of treatment and   rest.  You cannot lift your arm away from your body. This information is not intended to replace advice given to you by your health care provider. Make sure you discuss any questions you have with your health care provider. Document Released: 12/08/2005 Document Revised: 08/14/2016 Document Reviewed:  11/10/2015 Elsevier Interactive Patient Education  2018 Elsevier Inc.  

## 2018-01-26 NOTE — Progress Notes (Signed)
Subjective:  Patient ID: Garrett Clark, male    DOB: September 16, 1961  Age: 57 y.o. MRN: [id number]  CC: f/u   HPI  MARINO ROGERSON is a LHD 57 y.o. male with a medical historyof HLD, internal hemorrhoids, cocaine use,and LBP presents to f/u on weakness of both arms. Was referred to neurology for weakness of both upper extremities L > R. Has obtained appointment with neurology for March 29, 2018. Says laying on the left side and movements of the shoulder causes severe burning pain. The pain extends from the deltoids to the mid forearm. Also has moderately limited aROM of the left shoulder.        Outpatient Medications Prior to Visit  Medication Sig Dispense Refill  . acetaminophen (TYLENOL) 500 MG tablet Take 1,000 mg by mouth every 6 (six) hours as needed.    Marland Kitchen HYDROcodone-acetaminophen (NORCO) 5-325 MG tablet Take 1-2 tablets by mouth every 6 (six) hours as needed for moderate pain or severe pain. (Patient not taking: Reported on 12/30/2017) 30 tablet 0   No facility-administered medications prior to visit.      ROS Review of Systems  Constitutional: Negative for chills, fever and malaise/fatigue.  Eyes: Negative for blurred vision.  Respiratory: Negative for shortness of breath.   Cardiovascular: Negative for chest pain and palpitations.  Gastrointestinal: Negative for abdominal pain and nausea.  Genitourinary: Negative for dysuria and hematuria.  Musculoskeletal: Negative for joint pain and myalgias.  Skin: Negative for rash.  Neurological: Positive for focal weakness. Negative for tingling and headaches.  Psychiatric/Behavioral: Negative for depression. The patient is not nervous/anxious.     Objective:  BP 113/67 (BP Location: Left Arm, Patient Position: Sitting, Cuff Size: Normal)   Pulse (!) 58   Temp 97.9 F (36.6 C) (Oral)   Resp 18   Ht 5\' 6"  (1.676 m)   Wt 183 lb (83 kg)   SpO2 100%   BMI 29.54 kg/m   BP/Weight 01/26/2018 12/30/2017 27/02/5008  Systolic BP 381  829 937  Diastolic BP 67 83 73  Wt. (Lbs) 183 176 -  BMI 29.54 25.99 -      Physical Exam  Constitutional: He is oriented to person, place, and time.  Well developed, well nourished, NAD, polite  HENT:  Head: Normocephalic and atraumatic.  Eyes: No scleral icterus.  Neck: Normal range of motion. Neck supple. No thyromegaly present.  Pulmonary/Chest: Effort normal.  Musculoskeletal: He exhibits no edema.  Mildly limited aROM of the left shoulder.   Neurological: He is alert and oriented to person, place, and time.  Left hand grips strength 4/5. Right hand grip strength 5/5.  Skin: Skin is warm and dry. No rash noted. No erythema. No pallor.  Psychiatric: He has a normal mood and affect. His behavior is normal. Thought content normal.  Vitals reviewed.    Assessment & Plan:   1. Weakness of both upper extremities - Suspect impingement syndrome. Patient has a neurological evaluation in two months for a NCS. I will also consider orthopedic evaluation at next visit in two weeks. - predniSONE (DELTASONE) 10 MG tablet; Take 1 tablet (10 mg total) by mouth daily with breakfast for 12 days. Day 1 take 6 tablets  Day 2 take 6 tablets Day 3 take 5 tablets  Day 4 take 5 tablets   Day 5 take 4 tablets  Day 6 take 4 tablets  Day 7 take 3 tablets   Day 8 take 3 tablets  Day 9 take 2  tablets Day 10 take 2 tablets  Day 11 take 1 tablet   Day 12 take 1 tablet  Dispense: 42 tablet; Refill: 0 - gabapentin (NEURONTIN) 100 MG capsule; Take 1 capsule (100 mg total) by mouth 3 (three) times daily.  Dispense: 90 capsule; Refill: 3 - DG Shoulder Left; Future   Meds ordered this encounter  Medications  . predniSONE (DELTASONE) 10 MG tablet    Sig: Take 1 tablet (10 mg total) by mouth daily with breakfast for 12 days. Day 1 take 6 tablets  Day 2 take 6 tablets Day 3 take 5 tablets  Day 4 take 5 tablets   Day 5 take 4 tablets  Day 6 take 4 tablets  Day 7 take 3 tablets   Day 8 take 3 tablets  Day 9  take 2 tablets Day 10 take 2 tablets  Day 11 take 1 tablet   Day 12 take 1 tablet    Dispense:  42 tablet    Refill:  0    Order Specific Question:   Supervising Provider    Answer:   Tresa Garter [id number]  . gabapentin (NEURONTIN) 100 MG capsule    Sig: Take 1 capsule (100 mg total) by mouth 3 (three) times daily.    Dispense:  90 capsule    Refill:  3    Order Specific Question:   Supervising Provider    Answer:   Tresa Garter [id number]    Follow-up: Return in about 2 weeks (around 02/09/2018) for shoulder pain.   Clent Demark PA

## 2018-01-27 ENCOUNTER — Telehealth (INDEPENDENT_AMBULATORY_CARE_PROVIDER_SITE_OTHER): Payer: Self-pay | Admitting: *Deleted

## 2018-01-27 ENCOUNTER — Ambulatory Visit (HOSPITAL_COMMUNITY)
Admission: RE | Admit: 2018-01-27 | Discharge: 2018-01-27 | Disposition: A | Discharge status: Home / Self Care | Payer: Medicaid Other | Source: Ambulatory Visit | Attending: Physician Assistant | Admitting: Physician Assistant

## 2018-01-27 DIAGNOSIS — M25512 Pain in left shoulder: Secondary | ICD-10-CM | POA: Insufficient documentation

## 2018-01-27 DIAGNOSIS — R29898 Other symptoms and signs involving the musculoskeletal system: Secondary | ICD-10-CM | POA: Insufficient documentation

## 2018-01-27 NOTE — Telephone Encounter (Signed)
Medical Assistant left message on patient's home and cell voicemail. Voicemail states to give a call back to [place] with [facility] at [phone]. Patient is aware of left shoulder xray being normal.

## 2018-01-27 NOTE — Telephone Encounter (Signed)
-----   Message from Clent Demark, PA-C sent at 01/27/2018 12:25 PM EST ----- Left shoulder xray normal.

## 2018-02-09 ENCOUNTER — Ambulatory Visit (INDEPENDENT_AMBULATORY_CARE_PROVIDER_SITE_OTHER): Payer: Self-pay | Admitting: Physician Assistant

## 2018-02-09 ENCOUNTER — Encounter (INDEPENDENT_AMBULATORY_CARE_PROVIDER_SITE_OTHER): Payer: Self-pay | Admitting: Physician Assistant

## 2018-02-09 VITALS — BP 137/80 | HR 58 | Temp 98.4°F | Resp 18 | Ht 69.0 in | Wt 186.0 lb

## 2018-02-09 DIAGNOSIS — R202 Paresthesia of skin: Secondary | ICD-10-CM

## 2018-02-09 DIAGNOSIS — M79602 Pain in left arm: Secondary | ICD-10-CM

## 2018-02-09 DIAGNOSIS — N50812 Left testicular pain: Secondary | ICD-10-CM

## 2018-02-09 DIAGNOSIS — R29898 Other symptoms and signs involving the musculoskeletal system: Secondary | ICD-10-CM

## 2018-02-09 DIAGNOSIS — M79601 Pain in right arm: Secondary | ICD-10-CM

## 2018-02-09 DIAGNOSIS — M25512 Pain in left shoulder: Secondary | ICD-10-CM

## 2018-02-09 DIAGNOSIS — R1032 Left lower quadrant pain: Secondary | ICD-10-CM

## 2018-02-09 DIAGNOSIS — M25511 Pain in right shoulder: Secondary | ICD-10-CM

## 2018-02-09 MED ORDER — ACETAMINOPHEN-CODEINE #3 300-30 MG PO TABS: 1.0000 | ORAL_TABLET | Freq: Two times a day (BID) | ORAL | 0 refills | Status: DC

## 2018-02-09 MED ORDER — NAPROXEN 500 MG PO TABS: 500.0000 mg | ORAL_TABLET | Freq: Two times a day (BID) | ORAL | 0 refills | Status: DC

## 2018-02-09 MED ORDER — GABAPENTIN 300 MG PO CAPS: 300.0000 mg | ORAL_CAPSULE | Freq: Three times a day (TID) | ORAL | 3 refills | Status: DC

## 2018-02-09 MED ORDER — CIPROFLOXACIN HCL 500 MG PO TABS: 500.0000 mg | ORAL_TABLET | Freq: Two times a day (BID) | ORAL | 0 refills | Status: DC

## 2018-02-09 NOTE — Progress Notes (Signed)
Subjective:  Patient ID: Garrett Clark, male    DOB: July 26, 1961  Age: 57 y.o. MRN: [id number]  CC: Weakness of arms  HPI Garrett Clark is DGLO77 y.o. male with a medical historyof HLD, internal  hemorrhoids, cocaine use,and LBP presentsto f/u on weakness of both arms. Was referred to neurology for weakness of both upper extremities L > R. Has obtained appointment with neurology for March 29, 2018. Says laying on the left side and movements of the shoulder causes severe burning pain. The pain extends from the deltoids to the mid forearm. Also has moderately limited aROM of the left shoulder. Was prescribed prednisone and gabapentin at last visit which have not helped at all.     Also complains of chronic mild left testicular pain and left inguinal pain. Feels that prednisone used for shoulders has relieved some of testicular pain. No penile drainage, no genital lesions, no fever/chills.    Outpatient Medications Prior to Visit  Medication Sig Dispense Refill  . gabapentin (NEURONTIN) 100 MG capsule Take 1 capsule (100 mg total) by mouth 3 (three) times daily. 90 capsule 3  . acetaminophen (TYLENOL) 500 MG tablet Take 1,000 mg by mouth every 6 (six) hours as needed.     No facility-administered medications prior to visit.      ROS Review of Systems  Constitutional: Negative for chills, fever and malaise/fatigue.  Eyes: Negative for blurred vision.  Respiratory: Negative for shortness of breath.   Cardiovascular: Negative for chest pain and palpitations.  Gastrointestinal: Negative for abdominal pain and nausea.  Genitourinary: Negative for dysuria and hematuria.       Testicular pain  Musculoskeletal: Positive for joint pain. Negative for myalgias.  Skin: Negative for rash.  Neurological: Negative for headaches.       UE burning bilaterally  Psychiatric/Behavioral: Negative for depression. The patient is not nervous/anxious.     Objective:  BP 137/80 (BP Location: Left  Arm, Patient Position: Sitting, Cuff Size: Normal)   Pulse (!) 58   Temp 98.4 F (36.9 C) (Oral)   Resp 18   Ht 5\' 9"  (1.753 m)   Wt 186 lb (84.4 kg)   SpO2 98%   BMI 27.47 kg/m   BP/Weight 02/09/2018 05/25/3328 04/21/8840  Systolic BP 660 630 160  Diastolic BP 80 67 83  Wt. (Lbs) 186 183 176  BMI 27.47 29.54 25.99      Physical Exam  Constitutional: He is oriented to person, place, and time.  Well developed, well nourished, NAD, polite  HENT:  Head: Normocephalic and atraumatic.  Eyes: No scleral icterus.  Neck: Normal range of motion.  Cardiovascular: Normal rate, regular rhythm and normal heart sounds.  Pulmonary/Chest: Effort normal and breath sounds normal.  Abdominal: Soft. Bowel sounds are normal. There is no tenderness.  Genitourinary:  Genitourinary Comments: Left testicle tenderness. No inguinal hernia.  Musculoskeletal: He exhibits no edema.  UE with full aROM   Neurological: He is alert and oriented to person, place, and time. No cranial nerve deficit. Coordination normal.  Skin: Skin is warm and dry. No rash noted. No erythema. No pallor.  Psychiatric: He has a normal mood and affect. His behavior is normal. Thought content normal.  Vitals reviewed.    Assessment & Plan:    1. Weakness of both upper extremities - AMB referral to orthopedics  2. Pain of both shoulder joints - AMB referral to orthopedics - acetaminophen-codeine (TYLENOL #3) 300-30 MG tablet; Take 1 tablet by mouth every 12 (  twelve) hours.  Dispense: 60 tablet; Refill: 0  3. Paresthesia and pain of both upper extremities - gabapentin (NEURONTIN) 300 MG capsule; Take 1 capsule (300 mg total) by mouth 3 (three) times daily.  Dispense: 90 capsule; Refill: 3  4. Inguinal pain, left - ciprofloxacin (CIPRO) 500 MG tablet; Take 1 tablet (500 mg total) by mouth 2 (two) times daily.  Dispense: 20 tablet; Refill: 0 - naproxen (NAPROSYN) 500 MG tablet; Take 1 tablet (500 mg total) by mouth 2 (two)  times daily with a meal.  Dispense: 30 tablet; Refill: 0  5. Pain in left testicle - ciprofloxacin (CIPRO) 500 MG tablet; Take 1 tablet (500 mg total) by mouth 2 (two) times daily.  Dispense: 20 tablet; Refill: 0 - naproxen (NAPROSYN) 500 MG tablet; Take 1 tablet (500 mg total) by mouth 2 (two) times daily with a meal.  Dispense: 30 tablet; Refill: 0   Meds ordered this encounter  Medications  . acetaminophen-codeine (TYLENOL #3) 300-30 MG tablet    Sig: Take 1 tablet by mouth every 12 (twelve) hours.    Dispense:  60 tablet    Refill:  0    Order Specific Question:   Supervising Provider    Answer:   Tresa Garter [id number]  . gabapentin (NEURONTIN) 300 MG capsule    Sig: Take 1 capsule (300 mg total) by mouth 3 (three) times daily.    Dispense:  90 capsule    Refill:  3    Order Specific Question:   Supervising Provider    Answer:   Tresa Garter [id number]  . ciprofloxacin (CIPRO) 500 MG tablet    Sig: Take 1 tablet (500 mg total) by mouth 2 (two) times daily.    Dispense:  20 tablet    Refill:  0    Order Specific Question:   Supervising Provider    Answer:   Tresa Garter [id number]  . naproxen (NAPROSYN) 500 MG tablet    Sig: Take 1 tablet (500 mg total) by mouth 2 (two) times daily with a meal.    Dispense:  30 tablet    Refill:  0    Order Specific Question:   Supervising Provider    Answer:   Tresa Garter [id number]    Follow-up: Return in about 8 weeks (around 04/06/2018) for shoulder pain, testicle pain.   Clent Demark PA

## 2018-02-09 NOTE — Patient Instructions (Signed)
Hydrocele, Adult A hydrocele is a collection of fluid in the loose pouch of skin that holds the testicles (scrotum). Usually, it affects only one testicle. What are the causes? This condition may be caused by:  An injury to the scrotum.  An infection.  A tumor or cancer of the testicle.  Twisting of a testicle.  Decreased blood flow to the scrotum.  What are the signs or symptoms? A hydrocele feels like a water-filled balloon. It may also feel heavy. A hydrocele can cause:  Swelling of the scrotum. The swelling may decrease when you lie down.  Swelling of the groin.  Mild discomfort in the scrotum.  Pain. This can develop if the hydrocele was caused by infection or twisting.  How is this diagnosed? This condition may be diagnosed with a medical history, physical exam, and imaging tests. You may also have blood and urine tests to check for infection. How is this treated? Treatment may include:  Watching and waiting, particularly if the hydrocele causes no symptoms.  Treatment of the underlying condition. This may include using antibiotic medicine.  Surgery to drain the fluid. Some surgical options include: ? Needle aspiration. For this procedure, a needle is used to drain fluid. ? Hydrocelectomy. For this procedure, an incision is made in the scrotum to remove the fluid sac.  Follow these instructions at home:  Keep all follow-up visits as told by your health care provider. This is important.  Watch the hydrocele for any changes.  Take over-the-counter and prescription medicines only as told by your health care provider.  If you were prescribed an antibiotic medicine, use it as told by your health care provider. Do not stop using the antibiotic even if your condition improves. Contact a health care provider if:  The swelling in your scrotum or groin gets worse.  The hydrocele becomes red, firm, tender to the touch, or painful.  You notice any changes in the  hydrocele.  You have a fever. This information is not intended to replace advice given to you by your health care provider. Make sure you discuss any questions you have with your health care provider. Document Released: 05/28/2010 Document Revised: 05/15/2016 Document Reviewed: 12/04/2014 Elsevier Interactive Patient Education  2018 Elsevier Inc.  

## 2018-02-17 ENCOUNTER — Ambulatory Visit (INDEPENDENT_AMBULATORY_CARE_PROVIDER_SITE_OTHER): Payer: Medicaid Other | Admitting: Orthopedic Surgery

## 2018-02-17 ENCOUNTER — Ambulatory Visit (INDEPENDENT_AMBULATORY_CARE_PROVIDER_SITE_OTHER): Payer: Medicaid Other

## 2018-02-17 ENCOUNTER — Encounter (INDEPENDENT_AMBULATORY_CARE_PROVIDER_SITE_OTHER): Payer: Self-pay | Admitting: Orthopedic Surgery

## 2018-02-17 DIAGNOSIS — M545 Low back pain: Secondary | ICD-10-CM | POA: Diagnosis not present

## 2018-02-17 DIAGNOSIS — M25552 Pain in left hip: Secondary | ICD-10-CM | POA: Diagnosis not present

## 2018-02-17 DIAGNOSIS — M25551 Pain in right hip: Secondary | ICD-10-CM

## 2018-02-17 NOTE — Progress Notes (Signed)
Office Visit Note   Patient: Garrett Clark           Date of Birth: 1961/06/03           MRN: [id number] Visit Date: 02/17/2018 Requested by: Clent Demark, PA-C [place],  [id number] PCP: Tawny Asal  Subjective: Chief Complaint  Patient presents with  . Hip Pain    bilateral    HPI: Garrett Clark is a patient with bilateral hip pain and some low back pain.  3 months ago he had his left kidney removed for cancer.  Reports some occasional numbness and tingling radiating to the great toe.  States that the pain is constant even at rest but has been going on for a year.  States that on the left-hand side the pain goes from the groin into his left testicle.  Denies any right-sided radicular leg pain.              ROS: All systems reviewed are negative as they relate to the chief complaint within the history of present illness.  Patient denies  fevers or chills.   Assessment & Plan: Visit Diagnoses:  1. Pain in left hip   2. Pain in right hip     Plan: Impression is atypical hip pain and low back pain in a patient who has had his kidney removed for kidney cancer.  Plain radiographs unremarkable of back and hip; however, my concern would be about metastatic disease.  Also possibility with the radiation into the testicle is hernia.  We will get MRI lumbar spine and MRI pelvis to evaluate for hernia versus metastatic disease versus other occult pathology.  If negative referral to general surgery.  Follow-up after those studies  Follow-Up Instructions: No Follow-up on file.   Orders:  Orders Placed This Encounter  Procedures  . XR HIPS BILAT W OR W/O PELVIS 3-4 VIEWS   No orders of the defined types were placed in this encounter.     Procedures: No procedures performed   Clinical Data: No additional findings.  Objective: Vital Signs: There were no vitals taken for this visit.  Physical Exam:   Constitutional: Patient appears  well-developed HEENT:  Head: Normocephalic Eyes:EOM are normal Neck: Normal range of motion Cardiovascular: Normal rate Pulmonary/chest: Effort normal Neurologic: Patient is alert Skin: Skin is warm Psychiatric: Patient has normal mood and affect    Ortho Exam: Orthopedic exam demonstrates well-healed incisions around the left lower quadrant of the abdomen.  He has no groin pain with internal/external rotation of either leg.  Good hip flexion abduction strength.  No nerve root tension signs.  No paresthesias in either leg.  Pedal pulses palpable.  Reflexes symmetric bilateral patella and Achilles.  No definite paresthesias L1 S1 bilaterally although subjectively he has pain in the left great toe with paresthesias.  Specialty Comments:  No specialty comments available.  Imaging: Xr Hips Bilat W Or W/o Pelvis 3-4 Views  Result Date: 02/17/2018 AP pelvis and bilateral lateral hip radiographs reviewed.  No significant osteoarthritis or joint space narrowing is present.  Sacroiliac joints normal.  Bone cortices normal in the proximal femur.  No other abnormality of the bony pelvis.  Normal bilateral hip radiographs    PMFS History: Patient Active Problem List   Diagnosis Date Noted  . Renal mass 12/07/2017  . Prolapsed internal hemorrhoids, grade 2 06/19/2017  . Pure hypercholesterolemia 03/30/2017  . Cocaine use 03/23/2017   Past Medical History:  Diagnosis  Date  . Cancer [facility])    Left kidney  . Hyperlipidemia   . Internal hemorrhoids   . Polycystic liver disease   . Substance abuse ([place])     Family History  Problem Relation Age of Onset  . Diabetes Mother   . Hypertension Mother   . Alcohol abuse Mother   . Diabetes Father   . Kidney disease Father   . Diabetes Sister   . Stroke Sister   . Diabetes Paternal Grandmother   . Cancer Paternal Grandfather        type unknown  . Colon cancer Neg Hx     Past Surgical History:  Procedure Laterality Date  . COLONOSCOPY     . HEMORRHOID BANDING    . ROBOT ASSISTED LAPAROSCOPIC NEPHRECTOMY Left 12/07/2017   Procedure: XI ROBOTIC ASSISTED LAPAROSCOPIC NEPHRECTOMY;  Surgeon: Cleon Gustin, MD;  Location: [facility];  Service: Urology;  Laterality: Left;   Social History   Occupational History  . Occupation: [employer]  Tobacco Use  . Smoking status: Former Smoker    Types: Cigarettes    Last attempt to quit: 12/03/2012    Years since quitting: 5.2  . Smokeless tobacco: Never Used  Substance and Sexual Activity  . Alcohol use: Yes    Comment: last drink on Friday 12/04/2017 2 shots   . 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 Sexual activity: Not on file

## 2018-02-17 NOTE — Progress Notes (Signed)
x

## 2018-02-18 ENCOUNTER — Encounter (HOSPITAL_COMMUNITY): Payer: Self-pay | Admitting: Emergency Medicine

## 2018-02-18 ENCOUNTER — Other Ambulatory Visit: Payer: Self-pay

## 2018-02-18 ENCOUNTER — Ambulatory Visit (HOSPITAL_COMMUNITY)
Admission: EM | Admit: 2018-02-18 | Discharge: 2018-02-18 | Disposition: A | Discharge status: Home / Self Care | Payer: Medicaid Other

## 2018-02-18 ENCOUNTER — Emergency Department (HOSPITAL_COMMUNITY): Payer: Medicaid Other

## 2018-02-18 ENCOUNTER — Emergency Department (HOSPITAL_COMMUNITY)
Admission: EM | Admit: 2018-02-18 | Discharge: 2018-02-18 | Disposition: A | Discharge status: Home / Self Care | Payer: Medicaid Other | Attending: Emergency Medicine | Admitting: Emergency Medicine

## 2018-02-18 ENCOUNTER — Encounter (HOSPITAL_COMMUNITY): Payer: Self-pay

## 2018-02-18 DIAGNOSIS — Z87891 Personal history of nicotine dependence: Secondary | ICD-10-CM | POA: Diagnosis not present

## 2018-02-18 DIAGNOSIS — R1011 Right upper quadrant pain: Secondary | ICD-10-CM

## 2018-02-18 DIAGNOSIS — R1084 Generalized abdominal pain: Secondary | ICD-10-CM | POA: Insufficient documentation

## 2018-02-18 DIAGNOSIS — E785 Hyperlipidemia, unspecified: Secondary | ICD-10-CM | POA: Diagnosis not present

## 2018-02-18 LAB — URINALYSIS, ROUTINE W REFLEX MICROSCOPIC
BILIRUBIN URINE: NEGATIVE
Glucose, UA: NEGATIVE mg/dL
HGB URINE DIPSTICK: NEGATIVE
Ketones, ur: NEGATIVE mg/dL
Leukocytes, UA: NEGATIVE
Nitrite: NEGATIVE
Protein, ur: NEGATIVE mg/dL
SPECIFIC GRAVITY, URINE: 1.02 (ref 1.005–1.030)
pH: 6 (ref 5.0–8.0)

## 2018-02-18 LAB — CBC
HCT: 40.4 % (ref 39.0–52.0)
HEMOGLOBIN: 13.6 g/dL (ref 13.0–17.0)
MCH: 30.6 pg (ref 26.0–34.0)
MCHC: 33.7 g/dL (ref 30.0–36.0)
MCV: 91 fL (ref 78.0–100.0)
PLATELETS: 258 10*3/uL (ref 150–400)
RBC: 4.44 MIL/uL (ref 4.22–5.81)
RDW: 14.6 % (ref 11.5–15.5)
WBC: 4 10*3/uL (ref 4.0–10.5)

## 2018-02-18 LAB — COMPREHENSIVE METABOLIC PANEL
ALBUMIN: 3.9 g/dL (ref 3.5–5.0)
ALK PHOS: 87 U/L (ref 38–126)
ALT: 18 U/L (ref 17–63)
AST: 28 U/L (ref 15–41)
Anion gap: 10 (ref 5–15)
BUN: 11 mg/dL (ref 6–20)
CALCIUM: 9 mg/dL (ref 8.9–10.3)
CO2: 23 mmol/L (ref 22–32)
CREATININE: 1.72 mg/dL — AB (ref 0.61–1.24)
Chloride: 107 mmol/L (ref 101–111)
GFR calc Af Amer: 50 mL/min — ABNORMAL LOW (ref >60)
GFR calc non Af Amer: 43 mL/min — ABNORMAL LOW (ref >60)
GLUCOSE: 82 mg/dL (ref 65–99)
Potassium: 4.2 mmol/L (ref 3.5–5.1)
SODIUM: 140 mmol/L (ref 135–145)
Total Bilirubin: 1.1 mg/dL (ref 0.3–1.2)
Total Protein: 7.2 g/dL (ref 6.5–8.1)

## 2018-02-18 LAB — LIPASE, BLOOD: Lipase: 34 U/L (ref 11–51)

## 2018-02-18 MED ORDER — DICYCLOMINE HCL 20 MG PO TABS
20.0000 mg | ORAL_TABLET | Freq: Two times a day (BID) | ORAL | 0 refills | Status: DC
Start: 2018-02-18 — End: 2018-03-29

## 2018-02-18 MED ORDER — MORPHINE SULFATE (PF) 4 MG/ML IV SOLN
4.0000 mg | Freq: Once | INTRAVENOUS | Status: DC
Start: 2018-02-18 — End: 2018-02-18
  Filled 2018-02-18: qty 1

## 2018-02-18 MED ORDER — OXYCODONE-ACETAMINOPHEN 5-325 MG PO TABS
1.0000 | ORAL_TABLET | Freq: Once | ORAL | Status: AC
  Administered 2018-02-18: 1 via ORAL
  Filled 2018-02-18: qty 1

## 2018-02-18 MED ORDER — ONDANSETRON HCL 4 MG/2ML IJ SOLN
4.0000 mg | Freq: Once | INTRAMUSCULAR | Status: DC
  Filled 2018-02-18: qty 2

## 2018-02-18 MED ORDER — SODIUM CHLORIDE 0.9 % IV BOLUS (SEPSIS): 1000.0000 mL | Freq: Once | INTRAVENOUS | Status: DC

## 2018-02-18 NOTE — ED Triage Notes (Addendum)
Pt states abdominal pain that started this week. Reports pain X3 days. Pt denies n/v. RUQ pain reported. Pt states left nephrectomy in December.

## 2018-02-18 NOTE — ED Provider Notes (Signed)
[facility]    CSN: [id number] Arrival date & time: 02/18/18  1000     History   Chief Complaint Chief Complaint  Patient presents with  . Abdominal Pain    HPI Garrett Clark is a 57 y.o. male history of recent nephrectomy, substance abuse, polycystic liver disease presenting today with right upper quadrant pain.  His pain has been occurring all week, worsening over the past 3 days.  States it is a sharp and stabbing pain that happens basically all day.  He went to work today and had to leave due to sharp stabbing pains that make him double over.  Worse with standing.  Denies any association with eating, he is eating and drinking fine.  No nausea or vomiting.  Bowels have been regular, states he has approximately 2 bowel movements a day.  Denies any blood in stool.  Denies any lightheadedness or dizziness.  Denies any urinary symptoms of dysuria, discharge, increased frequency, hematuria.  Denies any shortness of breath, cough, chest pain.  Recently had nephrectomy on the left side in December.  Has not had any complications since.  Does endorse some back pain, but feels this is not radiated from his abdominal pain.  HPI  Past Medical History:  Diagnosis Date  . Cancer ([place])    Left kidney  . Hyperlipidemia   . Internal hemorrhoids   . Polycystic liver disease   . Substance abuse [facility])     Patient Active Problem List   Diagnosis Date Noted  . Renal mass 12/07/2017  . Prolapsed internal hemorrhoids, grade 2 06/19/2017  . Pure hypercholesterolemia 03/30/2017  . Cocaine use 03/23/2017    Past Surgical History:  Procedure Laterality Date  . COLONOSCOPY    . HEMORRHOID BANDING    . ROBOT ASSISTED LAPAROSCOPIC NEPHRECTOMY Left 12/07/2017   Procedure: XI ROBOTIC ASSISTED LAPAROSCOPIC NEPHRECTOMY;  Surgeon: Cleon Gustin, MD;  Location: [facility];  Service: Urology;  Laterality: Left;       Home Medications    Prior to Admission medications   Medication  Sig Start Date End Date Taking? Authorizing Provider  acetaminophen-codeine (TYLENOL #3) 300-30 MG tablet Take 1 tablet by mouth every 12 (twelve) hours. 02/09/18   Clent Demark, PA-C  ciprofloxacin (CIPRO) 500 MG tablet Take 1 tablet (500 mg total) by mouth 2 (two) times daily. 02/09/18   Clent Demark, PA-C  gabapentin (NEURONTIN) 300 MG capsule Take 1 capsule (300 mg total) by mouth 3 (three) times daily. 02/09/18   Clent Demark, PA-C  naproxen (NAPROSYN) 500 MG tablet Take 1 tablet (500 mg total) by mouth 2 (two) times daily with a meal. 02/09/18   Clent Demark, PA-C    Family History Family History  Problem Relation Age of Onset  . Diabetes Mother   . Hypertension Mother   . Alcohol abuse Mother   . Diabetes Father   . Kidney disease Father   . Diabetes Sister   . Stroke Sister   . Diabetes Paternal Grandmother   . Cancer Paternal Grandfather        type unknown  . Colon cancer Neg Hx     Social History Social History   Tobacco Use  . Smoking status: Former Smoker    Types: Cigarettes    Last attempt to quit: 12/03/2012    Years since quitting: 5.2  . Smokeless tobacco: Never Used  Substance Use Topics  . Alcohol use: Yes    Comment: last drink  on Friday 12/04/2017 2 shots   . 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   Review of Systems Review of Systems  Constitutional: Negative for chills, fatigue and fever.  HENT: Negative for ear pain and sore throat.   Eyes: Negative for pain and visual disturbance.  Respiratory: Negative for cough and shortness of breath.   Cardiovascular: Negative for chest pain.  Gastrointestinal: Positive for abdominal pain. Negative for nausea and vomiting.  Genitourinary: Negative for dysuria, flank pain and hematuria.  Musculoskeletal: Positive for  back pain. Negative for arthralgias.  Neurological: Negative for dizziness, seizures, syncope, weakness and light-headedness.  All other systems reviewed and are negative.    Physical Exam Triage Vital Signs ED Triage Vitals  Enc Vitals Group     BP 02/18/18 1022 120/66     Pulse Rate 02/18/18 1022 61     Resp 02/18/18 1022 16     Temp 02/18/18 1022 98.2 F (36.8 C)     Temp Source 02/18/18 1022 Oral     SpO2 02/18/18 1022 100 %     Weight --      Height --      Head Circumference --      Peak Flow --      Pain Score 02/18/18 1020 3     Pain Loc --      Pain Edu? --      Excl. in [place]? --    No data found.  Updated Vital Signs BP 120/66 (BP Location: Right Arm)   Pulse 61   Temp 98.2 F (36.8 C) (Oral)   Resp 16   SpO2 100%   Visual Acuity Right Eye Distance:   Left Eye Distance:   Bilateral Distance:    Right Eye Near:   Left Eye Near:    Bilateral Near:     Physical Exam  Constitutional: He appears well-developed and well-nourished.  HENT:  Head: Normocephalic and atraumatic.  Eyes: Conjunctivae and EOM are normal. Pupils are equal, round, and reactive to light.  Neck: Neck supple.  Cardiovascular: Normal rate and regular rhythm.  No murmur heard. Pulmonary/Chest: Effort normal and breath sounds normal. No respiratory distress.  Breathing comfortably at rest, CTA BL  Abdominal: Soft. There is tenderness in the right upper quadrant.  Abdomen is soft, patient does not guard during exam, patient has a positive Murphy sign, halted inspiration, and right upper quadrant tenderness, tenderness extending into epigastrium and part of left upper quadrant.  Negative McBurney's, negative Rovsing.  No CVA tenderness  Musculoskeletal: He exhibits no edema.  Neurological: He is alert.  Skin: Skin is warm and dry.  Psychiatric: He has a normal mood and affect.  Nursing note and vitals reviewed.    UC Treatments / Results  Labs (all labs ordered are listed, but only  abnormal results are displayed) Labs Reviewed - No data to display  EKG  EKG Interpretation None       Radiology Xr Hips Bilat W Or W/o Pelvis 3-4 Views  Result Date: 02/17/2018 AP pelvis and bilateral lateral hip radiographs reviewed.  No significant osteoarthritis or joint space narrowing is present.  Sacroiliac joints normal.  Bone cortices normal in the proximal femur.  No other abnormality of the bony pelvis.  Normal bilateral hip radiographs  Xr Lumbar Spine 2-3 Views  Result Date: 02/17/2018 AP lateral lumbar spine shows no arthritis.  Joint space is maintained between the vertebral bodies.  No spondylolisthesis.  No evidence of abnormal bone lesions.   Procedures Procedures (including critical care time)  Medications Ordered in UC Medications - No data to display   Initial Impression / Assessment and Plan / UC Course  I have reviewed the triage vital signs and the nursing notes.  Pertinent labs & imaging results that were available during my care of the patient were reviewed by me and considered in my medical decision making (see chart for details).     Patient with positive Murphy sign, although he does not have any associations with food, nausea or vomiting, patient does have history of polycystic liver disease, substance abuse.  He does state that he has not been drinking heavily recently.  Recent nephrectomy in the past 2 months, although would expect complications sooner than now.  Will recommend further evaluation in emergency room given tenderness of exam, history, and pain that causes him to double over.  Final Clinical Impressions(s) / UC Diagnoses   Final diagnoses:  Right upper quadrant abdominal pain    ED Discharge Orders    None       Controlled Substance Prescriptions  Controlled Substance Registry consulted? Not Applicable   Janith Lima, [place] 02/18/18 1106

## 2018-02-18 NOTE — ED Triage Notes (Signed)
Pt c/o RUQ abdominal pain x3 days, pain comes and goes. Denies n/v/d, denies tenderness.

## 2018-02-18 NOTE — ED Notes (Signed)
IV attempted by 2 RN's without success.  Pt requesting PO meds.

## 2018-02-18 NOTE — Discharge Instructions (Signed)
Patient had a positive murphy's sign.  Recommending further evaluation in emergency room.

## 2018-02-18 NOTE — ED Provider Notes (Signed)
[facility] EMERGENCY DEPARTMENT Provider Note   CSN: [id number] Arrival date & time: 02/18/18  1058     History   Chief Complaint Chief Complaint  Patient presents with  . Abdominal Pain    HPI Garrett Clark is a 57 y.o. male.  Pt presents to the ED today with abdominal pain.  Pt said sx started about 3 days ago.  No n/v/d or constipation.  No sob/cp/fever.  Pt did have a left nephrectomy due to malignancy on 12/17.      Past Medical History:  Diagnosis Date  . Cancer ([place])    Left kidney  . Hyperlipidemia   . Internal hemorrhoids   . Polycystic liver disease   . Substance abuse [facility])     Patient Active Problem List   Diagnosis Date Noted  . Renal mass 12/07/2017  . Prolapsed internal hemorrhoids, grade 2 06/19/2017  . Pure hypercholesterolemia 03/30/2017  . Cocaine use 03/23/2017    Past Surgical History:  Procedure Laterality Date  . COLONOSCOPY    . HEMORRHOID BANDING    . ROBOT ASSISTED LAPAROSCOPIC NEPHRECTOMY Left 12/07/2017   Procedure: XI ROBOTIC ASSISTED LAPAROSCOPIC NEPHRECTOMY;  Surgeon: Cleon Gustin, MD;  Location: [facility] ORS;  Service: Urology;  Laterality: Left;       Home Medications    Prior to Admission medications   Medication Sig Start Date End Date Taking? Authorizing Provider  acetaminophen-codeine (TYLENOL #3) 300-30 MG tablet Take 1 tablet by mouth every 12 (twelve) hours. 02/09/18   Clent Demark, PA-C  ciprofloxacin (CIPRO) 500 MG tablet Take 1 tablet (500 mg total) by mouth 2 (two) times daily. 02/09/18   Clent Demark, PA-C  dicyclomine (BENTYL) 20 MG tablet Take 1 tablet (20 mg total) by mouth 2 (two) times daily. 02/18/18   Isla Pence, MD  gabapentin (NEURONTIN) 300 MG capsule Take 1 capsule (300 mg total) by mouth 3 (three) times daily. 02/09/18   Clent Demark, PA-C  naproxen (NAPROSYN) 500 MG tablet Take 1 tablet (500 mg total) by mouth 2 (two) times daily with a meal. 02/09/18    Clent Demark, PA-C    Family History Family History  Problem Relation Age of Onset  . Diabetes Mother   . Hypertension Mother   . Alcohol abuse Mother   . Diabetes Father   . Kidney disease Father   . Diabetes Sister   . Stroke Sister   . Diabetes Paternal Grandmother   . Cancer Paternal Grandfather        type unknown  . Colon cancer Neg Hx     Social History Social History   Tobacco Use  . Smoking status: Former Smoker    Types: Cigarettes    Last attempt to quit: 12/03/2012    Years since quitting: 5.2  . Smokeless tobacco: Never Used  Substance Use Topics  . Alcohol use: Yes    Comment: last drink on Friday 12/04/2017 2 shots   . 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   Review of Systems Review of Systems  Gastrointestinal: Positive for abdominal pain.  All other systems reviewed and are negative.    Physical Exam Updated Vital Signs BP 132/66 (BP Location: Left Arm)  Pulse (!) 44   Temp 98.5 F (36.9 C) (Oral)   Resp 12   SpO2 100%   Physical Exam  Constitutional: He is oriented to person, place, and time. He appears well-developed and well-nourished.  HENT:  Head: Normocephalic and atraumatic.  Mouth/Throat: Oropharynx is clear and moist.  Eyes: EOM are normal. Pupils are equal, round, and reactive to light.  Cardiovascular: Normal rate, regular rhythm, normal heart sounds and intact distal pulses.  Pulmonary/Chest: Effort normal and breath sounds normal.  Abdominal: Normal appearance and bowel sounds are normal. There is tenderness in the right upper quadrant and right lower quadrant.  Neurological: He is alert and oriented to person, place, and time.  Skin: Skin is warm and dry. Capillary refill takes less than 2 seconds.  Psychiatric: He has a normal mood and affect. His  behavior is normal.  Nursing note and vitals reviewed.    ED Treatments / Results  Labs (all labs ordered are listed, but only abnormal results are displayed) Labs Reviewed  COMPREHENSIVE METABOLIC PANEL - Abnormal; Notable for the following components:      Result Value   Creatinine, Ser 1.72 (*)    GFR calc non Af Amer 43 (*)    GFR calc Af Amer 50 (*)    All other components within normal limits  LIPASE, BLOOD  CBC  URINALYSIS, ROUTINE W REFLEX MICROSCOPIC    EKG  EKG Interpretation None       Radiology Ct Abdomen Pelvis Wo Contrast  Result Date: 02/18/2018 CLINICAL DATA:  RUQ abdominal pain x3 days EXAM: CT ABDOMEN AND PELVIS WITHOUT CONTRAST TECHNIQUE: Multidetector CT imaging of the abdomen and pelvis was performed following the standard protocol without IV contrast. COMPARISON:  09/10/2017 FINDINGS: Lower chest: Stable 6 mm right middle lobe pulmonary nodule. Hepatobiliary: Multiple hypodense, fluid attenuating hepatic masses most consistent with cysts. No gallstones, gallbladder wall thickening, or biliary dilatation. Pancreas: Unremarkable. No pancreatic ductal dilatation or surrounding inflammatory changes. Spleen: Normal in size without focal abnormality. Adrenals/Urinary Tract: Adrenal glands are unremarkable. Prior left nephrectomy. Normal right kidney. No hydronephrosis. Bladder is unremarkable. Stomach/Bowel: Stomach is within normal limits. Appendix appears normal. No evidence of bowel wall thickening, distention, or inflammatory changes. Vascular/Lymphatic: No significant vascular findings are present. No enlarged abdominal or pelvic lymph nodes. Reproductive: Status post hysterectomy. No adnexal masses. Other: No abdominal wall hernia or abnormality. No abdominopelvic ascites. Musculoskeletal: No acute or significant osseous findings. IMPRESSION: 1. No acute abdominopelvic abnormality. 2. Stable 6 mm right middle lobe pulmonary nodule. If the patient is at high risk for  bronchogenic carcinoma, follow-up chest CT at 6-12 months is recommended. If the patient is at low risk for bronchogenic carcinoma, follow-up chest CT at 12 months is recommended. This recommendation follows the consensus statement: Guidelines for Management of Small Pulmonary Nodules Detected on CT Scans: A Statement from the [place] as published in Radiology 2005;237:395-400. Electronically Signed   By: Kathreen Devoid   On: 02/18/2018 16:04   Xr Hips Bilat W Or W/o Pelvis 3-4 Views  Result Date: 02/17/2018 AP pelvis and bilateral lateral hip radiographs reviewed.  No significant osteoarthritis or joint space narrowing is present.  Sacroiliac joints normal.  Bone cortices normal in the proximal femur.  No other abnormality of the bony pelvis.  Normal bilateral hip radiographs  Xr Lumbar Spine 2-3 Views  Result Date: 02/17/2018 AP lateral lumbar spine shows no arthritis.  Joint space is maintained between the vertebral bodies.  No spondylolisthesis.  No evidence of abnormal bone lesions.   Procedures Procedures (including critical care time)  Medications Ordered in ED Medications  oxyCODONE-acetaminophen (PERCOCET/ROXICET) 5-325 MG per tablet 1 tablet (1 tablet Oral Given 02/18/18 1814)     Initial Impression / Assessment and Plan / ED Course  I have reviewed the triage vital signs and the nursing notes.  Pertinent labs & imaging results that were available during my care of the patient were reviewed by me and considered in my medical decision making (see chart for details).    Labs/ct reassuring.  Pt said he's had this pain in the past.  He is encouraged to keep an eye on what he's eating to see if that has anything to do with sx.  Pt also given number of gi to f/u.  Return if worse.  F/u with pcp.  Final Clinical Impressions(s) / ED Diagnoses   Final diagnoses:  Generalized abdominal pain    ED Discharge Orders        Ordered    dicyclomine (BENTYL) 20 MG tablet  2 times  daily     02/18/18 1827       Isla Pence, MD 02/18/18 [phone]

## 2018-03-08 ENCOUNTER — Ambulatory Visit
Admission: RE | Admit: 2018-03-08 | Discharge: 2018-03-08 | Disposition: A | Discharge status: Home / Self Care | Payer: Medicaid Other | Source: Ambulatory Visit | Attending: Orthopedic Surgery | Admitting: Orthopedic Surgery

## 2018-03-08 ENCOUNTER — Other Ambulatory Visit: Payer: Medicaid Other

## 2018-03-08 DIAGNOSIS — M545 Low back pain: Secondary | ICD-10-CM

## 2018-03-09 ENCOUNTER — Telehealth (INDEPENDENT_AMBULATORY_CARE_PROVIDER_SITE_OTHER): Payer: Self-pay

## 2018-03-09 DIAGNOSIS — M25552 Pain in left hip: Secondary | ICD-10-CM

## 2018-03-09 NOTE — Telephone Encounter (Signed)
Pls get bone scan thx

## 2018-03-09 NOTE — Telephone Encounter (Signed)
Patients scan that was ordered was denied by Medicaid. The following is the response for denial. Please advise how you want to proceed.  Do you want to do peer to peer?      Dr. Marlou Sa will need to do a Peer to Peer, I did a reconsideration on 03/05/18 and still got denied. Here is the rational:   Based on [employer] Guidelines, we cannot approve this request. Guidelines support advanced imaging in the evaluation of suspected bone metastatic disease for any of the following: 1) bone scan is not feasible or readily available, 2) continued suspicion despite inconclusive or negative bone scan supplemented by plain x-rays or other imaging modalities, 3) new onset of back pain in a patient with Stage 4 cancer, 4) there is neurological compromise, 5) there is a soft tissue component suggested on other imaging modalities or physical exam, or 6) to differentiate neoplastic disease from Paget's disease of bone. The clinical information provided does not describe at least one of these and, therefore, the request is not indicated at this time.     Reconsideration was also Denied   Following consideration of the original adverse decision, the initial decision is upheld with the same rationale.   Please call [phone] use case # [id number] when doing Peer to Peer.   Thanks  [place]

## 2018-03-10 ENCOUNTER — Telehealth (INDEPENDENT_AMBULATORY_CARE_PROVIDER_SITE_OTHER): Payer: Self-pay | Admitting: *Deleted

## 2018-03-10 NOTE — Telephone Encounter (Signed)
Do you think we would be able to get bone scan approved for patient?

## 2018-03-10 NOTE — Telephone Encounter (Signed)
Most of time they do get approved. Once order is placed I can check on it.

## 2018-03-10 NOTE — Addendum Note (Signed)
Addended byLaurann Montana on: 03/10/2018 08:29 AM   Modules accepted: Orders

## 2018-03-10 NOTE — Telephone Encounter (Signed)
Order for bone scan entered.

## 2018-03-10 NOTE — Telephone Encounter (Signed)
Pt is scheduled at [facility] for [place] on Tues Mar 26 at 10am for injection with arrival at 945am, this is a 2 part scan so pt would need to come back at 1pm for scan, I called and left message on vm to return call for appt information.

## 2018-03-13 ENCOUNTER — Other Ambulatory Visit (INDEPENDENT_AMBULATORY_CARE_PROVIDER_SITE_OTHER): Payer: Self-pay | Admitting: Physician Assistant

## 2018-03-13 DIAGNOSIS — M25512 Pain in left shoulder: Principal | ICD-10-CM

## 2018-03-13 DIAGNOSIS — M25511 Pain in right shoulder: Secondary | ICD-10-CM

## 2018-03-16 ENCOUNTER — Encounter (HOSPITAL_COMMUNITY): Payer: Medicaid Other | Attending: Orthopedic Surgery

## 2018-03-16 ENCOUNTER — Encounter (HOSPITAL_COMMUNITY): Admission: RE | Admit: 2018-03-16 | Payer: Medicaid Other | Source: Ambulatory Visit

## 2018-03-24 ENCOUNTER — Other Ambulatory Visit: Payer: Self-pay | Admitting: Urology

## 2018-03-24 ENCOUNTER — Ambulatory Visit: Payer: Medicaid Other | Admitting: Urology

## 2018-03-24 ENCOUNTER — Ambulatory Visit (HOSPITAL_COMMUNITY)
Admission: RE | Admit: 2018-03-24 | Discharge: 2018-03-24 | Disposition: A | Discharge status: Home / Self Care | Payer: Medicaid Other | Source: Ambulatory Visit | Attending: Urology | Admitting: Urology

## 2018-03-24 DIAGNOSIS — C642 Malignant neoplasm of left kidney, except renal pelvis: Secondary | ICD-10-CM | POA: Insufficient documentation

## 2018-03-29 ENCOUNTER — Encounter: Payer: Self-pay | Admitting: Neurology

## 2018-03-29 ENCOUNTER — Ambulatory Visit (INDEPENDENT_AMBULATORY_CARE_PROVIDER_SITE_OTHER): Payer: Medicaid Other | Admitting: Neurology

## 2018-03-29 VITALS — BP 110/80 | HR 82 | Ht 69.0 in | Wt 186.4 lb

## 2018-03-29 DIAGNOSIS — M5412 Radiculopathy, cervical region: Secondary | ICD-10-CM

## 2018-03-29 DIAGNOSIS — R202 Paresthesia of skin: Secondary | ICD-10-CM

## 2018-03-29 DIAGNOSIS — M79602 Pain in left arm: Secondary | ICD-10-CM

## 2018-03-29 MED ORDER — CYCLOBENZAPRINE HCL 5 MG PO TABS: 5.0000 mg | ORAL_TABLET | Freq: Every evening | ORAL | 5 refills | Status: DC | PRN

## 2018-03-29 NOTE — Patient Instructions (Addendum)
Start flexeril 5mg  at bedtime  NCS/EMG of the left arm  MRI cervical spine wo contrast  We will call you with the results
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
## 2018-04-06 ENCOUNTER — Ambulatory Visit
Admission: RE | Admit: 2018-04-06 | Discharge: 2018-04-06 | Disposition: A | Discharge status: Home / Self Care | Payer: Medicaid Other | Source: Ambulatory Visit | Attending: Neurology | Admitting: Neurology

## 2018-04-06 DIAGNOSIS — M5412 Radiculopathy, cervical region: Secondary | ICD-10-CM

## 2018-04-06 DIAGNOSIS — R202 Paresthesia of skin: Secondary | ICD-10-CM

## 2018-04-06 DIAGNOSIS — M79602 Pain in left arm: Secondary | ICD-10-CM

## 2018-04-07 ENCOUNTER — Telehealth: Payer: Self-pay | Admitting: *Deleted

## 2018-04-07 NOTE — Telephone Encounter (Signed)
I will talk to the patient on Thursday.

## 2018-04-07 NOTE — Telephone Encounter (Signed)
-----   Message from Alda Berthold, DO sent at 04/06/2018  4:32 PM EDT ----- Please inform patient MRI cervical spine looks good, there is mild age-related changes but overall looks good.  No nerve impingement.  NCS/EMG is scheduled for Thursday.

## 2018-04-08 ENCOUNTER — Ambulatory Visit (INDEPENDENT_AMBULATORY_CARE_PROVIDER_SITE_OTHER): Payer: Medicaid Other | Admitting: Neurology

## 2018-04-08 ENCOUNTER — Other Ambulatory Visit: Payer: Self-pay | Admitting: *Deleted

## 2018-04-08 DIAGNOSIS — M5412 Radiculopathy, cervical region: Secondary | ICD-10-CM

## 2018-04-08 DIAGNOSIS — R202 Paresthesia of skin: Secondary | ICD-10-CM

## 2018-04-08 DIAGNOSIS — M79602 Pain in left arm: Secondary | ICD-10-CM

## 2018-04-08 DIAGNOSIS — M25512 Pain in left shoulder: Secondary | ICD-10-CM

## 2018-04-08 NOTE — Progress Notes (Signed)
Order placed and they will call patient to schedule.
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
## 2018-04-08 NOTE — Procedures (Signed)
[facility]  [place], [place] [address]  [place], [place] [address] Tel: [phone] Fax:  [phone] Test Date:  04/08/2018  Patient: Garrett Clark DOB: 14-Jan-1961 Physician: Narda Amber, DO  Sex: Male Height: 5\' 9"  Ref Phys: Narda Amber, DO  ID#: [id number] Temp: 38.0C Technician:    Patient Complaints: This is a 57 year old man referred for evaluation of left arm pain and weakness.  NCV & EMG Findings: Extensive electrodiagnostic testing of the left upper extremity shows:  1. All sensory responses including the left median, ulnar, radial, mixed palmer, medial and lateral antebrachial cutaneous sensory nerves are within normal limits. 2. All motor responses including the left median and ulnar nerves are within normal limits. 3. There is no evidence of active or chronic motor axon loss changes affecting any of the tested muscles. Motor unit configuration and recruitment pattern is within normal limits.  Impression: This is a normal study of the left upper extremity.   In particular, there is no evidence of a brachial plexopathy, cervical radiculopathy, or entrapment neuropathy.   ___________________________ Narda Amber, DO    Nerve Conduction Studies Anti Sensory Summary Table   Site NR Peak (ms) Norm Peak (ms) P-T Amp (V) Norm P-T Amp  Left Lat Ante Brach Cutan Anti Sensory (Lat Forearm)  38C  Lat Biceps    1.5 <2.9 9.8   Left Med Ante Brach Cutan Anti Sensory (Med Forearm)  38C  Elbow    2.8  8.4   Left Median Anti Sensory (2nd Digit)  38C  Wrist    3.5 <3.6 19.9 >15  Left Radial Anti Sensory (Base 1st Digit)  38C  Wrist    2.3 <2.7 17.5 >14  Left Ulnar Anti Sensory (5th Digit)  38C  Wrist    3.1 <3.1 18.0 >10   Motor Summary Table   Site NR Onset (ms) Norm Onset (ms) O-P Amp (mV) Norm O-P Amp Site1 Site2 Delta-0 (ms) Dist (cm) Vel (m/s) Norm Vel (m/s)  Left Median Motor (Abd Poll Brev)  38C  Wrist    3.4 <4.0 7.0 >6 Elbow Wrist 5.6 33.0 59  >50  Elbow    9.0  6.5         Left Ulnar Motor (Abd Dig Minimi)  38C  Wrist    2.8 <3.1 7.1 >7 B Elbow Wrist 4.6 27.0 59 >50  B Elbow    7.4  6.3  A Elbow B Elbow 1.4 10.0 71 >50  A Elbow    8.8  6.3          Comparison Summary Table   Site NR Peak (ms) Norm Peak (ms) P-T Amp (V) Site1 Site2 Delta-P (ms) Norm Delta (ms)  Left Median/Ulnar Palm Comparison (Wrist - 8cm)  38C  Median Palm    1.9 <2.2 54.6 Median Palm Ulnar Palm 0.2   Ulnar Palm    2.1 <2.2 5.0       EMG   Side Muscle Ins Act Fibs Psw Fasc Number Recrt Dur Dur. Amp Amp. Poly Poly. Comment  Left 1stDorInt Nml Nml Nml Nml Nml Nml Nml Nml Nml Nml Nml Nml N/A  Left PronatorTeres Nml Nml Nml Nml Nml Nml Nml Nml Nml Nml Nml Nml N/A  Left Biceps Nml Nml Nml Nml Nml Nml Nml Nml Nml Nml Nml Nml N/A  Left Triceps Nml Nml Nml Nml Nml Nml Nml Nml Nml Nml Nml Nml N/A  Left Deltoid Nml Nml Nml Nml Nml Nml Nml Nml Nml Nml  Nml Nml N/A      Waveforms:

## 2018-04-12 ENCOUNTER — Ambulatory Visit (INDEPENDENT_AMBULATORY_CARE_PROVIDER_SITE_OTHER): Payer: Medicaid Other | Admitting: Physician Assistant

## 2018-04-12 ENCOUNTER — Other Ambulatory Visit: Payer: Self-pay

## 2018-04-12 ENCOUNTER — Encounter (INDEPENDENT_AMBULATORY_CARE_PROVIDER_SITE_OTHER): Payer: Self-pay | Admitting: Physician Assistant

## 2018-04-12 VITALS — BP 138/86 | HR 57 | Temp 98.1°F | Ht 69.0 in | Wt 185.0 lb

## 2018-04-12 DIAGNOSIS — M79609 Pain in unspecified limb: Secondary | ICD-10-CM | POA: Diagnosis not present

## 2018-04-12 DIAGNOSIS — M5412 Radiculopathy, cervical region: Secondary | ICD-10-CM

## 2018-04-12 DIAGNOSIS — R202 Paresthesia of skin: Secondary | ICD-10-CM

## 2018-04-12 MED ORDER — ACETAMINOPHEN-CODEINE #3 300-30 MG PO TABS
1.0000 | ORAL_TABLET | Freq: Four times a day (QID) | ORAL | 0 refills | Status: AC | PRN
Start: 2018-04-12 — End: 2018-04-19

## 2018-04-12 NOTE — Patient Instructions (Signed)

## 2018-04-12 NOTE — Progress Notes (Signed)
Subjective:  Patient ID: Garrett Clark, male    DOB: 18-Mar-1961  Age: 57 y.o. MRN: [id number]  CC: shoulder pain and testicular pain  HPI Garrett Clark is [name] y.o. male with a medical historyof HLD, CKD, internal  hemorrhoids, cocaine use,and LBP presentsto f/u on weakness of both arms. Was referred to neurology for weakness of both upper extremities L > R. Was seen at neurology on 03/29/2018 with subsequent EMG that revealed no branchial plexopathy or nerve impingement to account for his paresthesia and pain. MRI revealed mild degenerative changes in the cervical spine without stenosis or neural impingement. Neurologist concluded a likely MSK etiology. Patient has been taking double dose of Gabapentin despite his CKD. Pain is mildly relieved with gabapentin but is still severe at time. Loses grip of items he is holding. Experiences sudden sharp pain along the proximal forearm. Patient is frustrated at this point that no etiology has been found yet. Does not endorse any other symptoms or complaints.      Outpatient Medications Prior to Visit  Medication Sig Dispense Refill  . cyclobenzaprine (FLEXERIL) 5 MG tablet Take 1 tablet (5 mg total) by mouth at bedtime as needed for muscle spasms. 30 tablet 5  . gabapentin (NEURONTIN) 300 MG capsule Take 1 capsule (300 mg total) by mouth 3 (three) times daily. (Patient taking differently: Take 300 mg by mouth 2 (two) times daily. ) 90 capsule 3  . acetaminophen (TYLENOL) 500 MG tablet Take 500 mg by mouth every 6 (six) hours as needed.     No facility-administered medications prior to visit.      ROS Review of Systems  Constitutional: Negative for chills, fever and malaise/fatigue.  Eyes: Negative for blurred vision.  Respiratory: Negative for shortness of breath.   Cardiovascular: Negative for chest pain and palpitations.  Gastrointestinal: Negative for abdominal pain and nausea.  Genitourinary: Negative for dysuria and hematuria.   Musculoskeletal: Negative for joint pain and myalgias.  Skin: Negative for rash.  Neurological: Negative for tingling and headaches.  Psychiatric/Behavioral: Negative for depression. The patient is not nervous/anxious.     Objective:  BP 138/86 (BP Location: Right Arm, Patient Position: Sitting, Cuff Size: Normal)   Pulse (!) 57   Temp 98.1 F (36.7 C) (Oral)   Ht 5\' 9"  (1.753 m)   Wt 185 lb (83.9 kg)   SpO2 98%   BMI 27.32 kg/m   BP/Weight 04/12/2018 03/29/2018 05/28/3015  Systolic BP 010 932 355  Diastolic BP 86 80 66  Wt. (Lbs) 185 186.38 -  BMI 27.32 27.52 -      Physical Exam  Constitutional: He is oriented to person, place, and time.  Well developed, well nourished, NAD, polite  HENT:  Head: Normocephalic and atraumatic.  Eyes: No scleral icterus.  Neck: Normal range of motion. Neck supple. No thyromegaly present.  Cardiovascular: Normal rate, regular rhythm and normal heart sounds.  Pulmonary/Chest: Effort normal.  Musculoskeletal: He exhibits no edema.  Full aROM of left shoulder with pain elicited on flexion, extension, abduction, and adduction.   Neurological: He is alert and oriented to person, place, and time.  Skin: Skin is warm and dry. No rash noted. No erythema. No pallor.  Psychiatric: He has a normal mood and affect. His behavior is normal. Thought content normal.  Vitals reviewed.    Assessment & Plan:   1. Paresthesia and pain of left extremity - AMB referral to orthopedics - acetaminophen-codeine (TYLENOL #3) 300-30 MG tablet; Take 1 tablet  by mouth every 6 (six) hours as needed for up to 7 days for moderate pain.  Dispense: 28 tablet; Refill: 0  2. Cervical radiculopathy - AMB referral to orthopedics - acetaminophen-codeine (TYLENOL #3) 300-30 MG tablet; Take 1 tablet by mouth every 6 (six) hours as needed for up to 7 days for moderate pain.  Dispense: 28 tablet; Refill: 0   Meds ordered this encounter  Medications  . acetaminophen-codeine  (TYLENOL #3) 300-30 MG tablet    Sig: Take 1 tablet by mouth every 6 (six) hours as needed for up to 7 days for moderate pain.    Dispense:  28 tablet    Refill:  0    Order Specific Question:   Supervising Provider    Answer:   Tresa Garter [id number]    Follow-up: Return in about 6 weeks (around 05/24/2018) for left arm pain.   Clent Demark PA

## 2018-04-26 ENCOUNTER — Encounter (INDEPENDENT_AMBULATORY_CARE_PROVIDER_SITE_OTHER): Payer: Self-pay | Admitting: Orthopedic Surgery

## 2018-04-26 ENCOUNTER — Ambulatory Visit (INDEPENDENT_AMBULATORY_CARE_PROVIDER_SITE_OTHER): Payer: Medicaid Other | Admitting: Orthopedic Surgery

## 2018-04-26 DIAGNOSIS — M5410 Radiculopathy, site unspecified: Secondary | ICD-10-CM

## 2018-04-26 DIAGNOSIS — M25512 Pain in left shoulder: Secondary | ICD-10-CM

## 2018-04-26 DIAGNOSIS — G8929 Other chronic pain: Secondary | ICD-10-CM | POA: Diagnosis not present

## 2018-04-27 ENCOUNTER — Encounter (INDEPENDENT_AMBULATORY_CARE_PROVIDER_SITE_OTHER): Payer: Self-pay | Admitting: Orthopedic Surgery
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
## 2018-05-08 ENCOUNTER — Ambulatory Visit (INDEPENDENT_AMBULATORY_CARE_PROVIDER_SITE_OTHER): Payer: Medicaid Other

## 2018-05-08 ENCOUNTER — Encounter (HOSPITAL_COMMUNITY): Payer: Self-pay | Admitting: Emergency Medicine

## 2018-05-08 ENCOUNTER — Ambulatory Visit (HOSPITAL_COMMUNITY)
Admission: EM | Admit: 2018-05-08 | Discharge: 2018-05-08 | Disposition: A | Discharge status: Home / Self Care | Payer: Medicaid Other | Attending: Physician Assistant | Admitting: Physician Assistant

## 2018-05-08 DIAGNOSIS — R001 Bradycardia, unspecified: Secondary | ICD-10-CM | POA: Diagnosis not present

## 2018-05-08 DIAGNOSIS — Z823 Family history of stroke: Secondary | ICD-10-CM | POA: Insufficient documentation

## 2018-05-08 DIAGNOSIS — Z905 Acquired absence of kidney: Secondary | ICD-10-CM | POA: Diagnosis not present

## 2018-05-08 DIAGNOSIS — Z8249 Family history of ischemic heart disease and other diseases of the circulatory system: Secondary | ICD-10-CM | POA: Diagnosis not present

## 2018-05-08 DIAGNOSIS — R42 Dizziness and giddiness: Secondary | ICD-10-CM | POA: Diagnosis not present

## 2018-05-08 DIAGNOSIS — Z841 Family history of disorders of kidney and ureter: Secondary | ICD-10-CM | POA: Diagnosis not present

## 2018-05-08 DIAGNOSIS — Q446 Cystic disease of liver: Secondary | ICD-10-CM | POA: Insufficient documentation

## 2018-05-08 DIAGNOSIS — Z833 Family history of diabetes mellitus: Secondary | ICD-10-CM | POA: Diagnosis not present

## 2018-05-08 DIAGNOSIS — Z87891 Personal history of nicotine dependence: Secondary | ICD-10-CM | POA: Insufficient documentation

## 2018-05-08 DIAGNOSIS — E785 Hyperlipidemia, unspecified: Secondary | ICD-10-CM | POA: Diagnosis not present

## 2018-05-08 LAB — POCT I-STAT, CHEM 8
BUN: 16 mg/dL (ref 6–20)
CALCIUM ION: 1.19 mmol/L (ref 1.15–1.40)
CHLORIDE: 103 mmol/L (ref 101–111)
Creatinine, Ser: 1.4 mg/dL — ABNORMAL HIGH (ref 0.61–1.24)
GLUCOSE: 108 mg/dL — AB (ref 65–99)
HEMATOCRIT: 50 % (ref 39.0–52.0)
Hemoglobin: 17 g/dL (ref 13.0–17.0)
Potassium: 4.2 mmol/L (ref 3.5–5.1)
SODIUM: 140 mmol/L (ref 135–145)
TCO2: 27 mmol/L (ref 22–32)

## 2018-05-08 LAB — POCT URINALYSIS DIP (DEVICE)
Bilirubin Urine: NEGATIVE
GLUCOSE, UA: NEGATIVE mg/dL
Hgb urine dipstick: NEGATIVE
Ketones, ur: NEGATIVE mg/dL
Leukocytes, UA: NEGATIVE
NITRITE: NEGATIVE
PROTEIN: 100 mg/dL — AB
Specific Gravity, Urine: 1.025 (ref 1.005–1.030)
UROBILINOGEN UA: 0.2 mg/dL (ref 0.0–1.0)
pH: 7 (ref 5.0–8.0)

## 2018-05-08 LAB — CBC
HCT: 48.7 % (ref 39.0–52.0)
Hemoglobin: 15.9 g/dL (ref 13.0–17.0)
MCH: 29.9 pg (ref 26.0–34.0)
MCHC: 32.6 g/dL (ref 30.0–36.0)
MCV: 91.5 fL (ref 78.0–100.0)
PLATELETS: 329 10*3/uL (ref 150–400)
RBC: 5.32 MIL/uL (ref 4.22–5.81)
RDW: 13.2 % (ref 11.5–15.5)
WBC: 5.5 10*3/uL (ref 4.0–10.5)

## 2018-05-08 MED ORDER — ONDANSETRON HCL 4 MG PO TABS: 4.0000 mg | ORAL_TABLET | Freq: Four times a day (QID) | ORAL | 0 refills | Status: DC

## 2018-05-08 NOTE — ED Provider Notes (Signed)
05/08/2018 3:13 PM   DOB: [birth year] / MRN: [id number]  SUBJECTIVE:  Garrett Clark is a 57 y.o. male presenting for dizzinesss.  He describes the dizziness as a type of weakness that gets worse if he leans forward and is as he stands up.   he has not tried anything for his problem.  Is been told he has a low heart rate now for at least a year.  Is a 89-year-old at home and tells me that his health this is top priority now.  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  He takes gabapentin for a left shoulder issue that is tried to get an MRI for.  He tells me is not drinking enough water.  He has No Known Allergies.   He  has a past medical history of Cancer ([place]), Hyperlipidemia, Internal hemorrhoids, Polycystic liver disease, and Substance abuse ([place]).    He  reports that he quit smoking about 5 years ago. His smoking use included cigarettes. He has never used smokeless tobacco. He reports that he drinks alcohol. He reports that he has current or past drug history. Drug: Marijuana. He  has no sexual activity history on file. The patient  has a past surgical history that includes Colonoscopy; Hemorrhoid banding; and Robot assisted laparoscopic nephrectomy (Left, 12/07/2017).  His family history includes Alcohol abuse in his mother; Cancer in his paternal grandfather; Diabetes in his father, mother, paternal grandmother, and sister; Hypertension in his mother; Kidney disease in his father; Stroke in his sister.  Review of Systems  Constitutional: Positive for malaise/fatigue. Negative for chills, diaphoresis, fever and weight loss.  Eyes: Negative for blurred vision, double vision, photophobia, pain, discharge and redness.  Respiratory: Negative for shortness of breath.   Cardiovascular: Negative for chest pain, orthopnea and leg swelling.  Gastrointestinal: Positive for nausea and vomiting. Negative for abdominal pain,  blood in stool, constipation, diarrhea, heartburn and melena.  Musculoskeletal: Negative for back pain, falls, joint pain, myalgias and neck pain.  Skin: Negative for itching and rash.  Neurological: Positive for dizziness. Negative for tingling, tremors, sensory change, speech change, focal weakness, seizures, loss of consciousness, weakness and headaches.  Endo/Heme/Allergies: Negative for polydipsia.    OBJECTIVE:  BP 134/61 (BP Location: Right Arm)   Pulse (!) 55   Temp (!) 97.5 F (36.4 C) (Oral)   Resp 18   SpO2 99%   Pulse Readings from Last 3 Encounters:  05/08/18 (!) 55  04/12/18 (!) 57  03/29/18 82   Orthostatic VS for the past 24 hrs:  BP- Lying Pulse- Lying BP- Sitting Pulse- Sitting BP- Standing at 0 minutes Pulse- Standing at 0 minutes  05/08/18 1431 (!) 143/93 (!) 40 158/85 (!) 43 (!) 148/98 (!) 45    Physical Exam  Constitutional: He is oriented to person, place, and time. He appears well-developed. He does not appear ill.  HENT:  Mouth/Throat: No oropharyngeal exudate.  Eyes: Pupils are equal, round, and reactive to light. Conjunctivae and EOM are normal.  Cardiovascular: Normal rate, regular rhythm, S1 normal, S2 normal, normal heart sounds, intact distal pulses and normal pulses. Exam reveals no gallop and no friction rub.  No murmur heard. Pulmonary/Chest: Effort normal. No stridor. No respiratory distress. He has no wheezes. He has no rales. He exhibits no tenderness.  Abdominal: Soft. Normal appearance and bowel sounds are normal. He exhibits no distension and no mass.  There is no tenderness. There is no rigidity, no rebound, no guarding and no CVA tenderness. No hernia.  Musculoskeletal: Normal range of motion. He exhibits no edema.  Neurological: He is alert and oriented to person, place, and time. He is not disoriented. No cranial nerve deficit or sensory deficit. He exhibits normal muscle tone. He displays a negative Romberg sign. Coordination and gait  normal. GCS eye subscore is 4. GCS verbal subscore is 5. GCS motor subscore is 6.  Negative pronator drift.   Skin: Skin is warm and dry. No rash noted. He is not diaphoretic. No erythema. No pallor.  Psychiatric: He has a normal mood and affect.  Nursing note and vitals reviewed.   Results for orders placed or performed during the hospital encounter of 05/08/18 (from the past 72 hour(s))  POCT urinalysis dip (device)     Status: Abnormal   Collection Time: 05/08/18  2:17 PM  Result Value Ref Range   Glucose, UA NEGATIVE NEGATIVE mg/dL   Bilirubin Urine NEGATIVE NEGATIVE   Ketones, ur NEGATIVE NEGATIVE mg/dL   Specific Gravity, Urine 1.025 1.005 - 1.030   Hgb urine dipstick NEGATIVE NEGATIVE   pH 7.0 5.0 - 8.0   Protein, ur 100 (A) NEGATIVE mg/dL   Urobilinogen, UA 0.2 0.0 - 1.0 mg/dL   Nitrite NEGATIVE NEGATIVE   Leukocytes, UA NEGATIVE NEGATIVE    Comment: Biochemical Testing Only. Please order routine urinalysis from main lab if confirmatory testing is needed.  I-STAT, chem 8     Status: Abnormal   Collection Time: 05/08/18  2:22 PM  Result Value Ref Range   Sodium 140 135 - 145 mmol/L   Potassium 4.2 3.5 - 5.1 mmol/L   Chloride 103 101 - 111 mmol/L   BUN 16 6 - 20 mg/dL   Creatinine, Ser 1.40 (H) 0.61 - 1.24 mg/dL   Glucose, Bld 108 (H) 65 - 99 mg/dL   Calcium, Ion 1.19 1.15 - 1.40 mmol/L   TCO2 27 22 - 32 mmol/L   Hemoglobin 17.0 13.0 - 17.0 g/dL   HCT 50.0 39.0 - 52.0 %    Dg Chest 2 View  Result Date: 05/08/2018 CLINICAL DATA:  57 year old male with a history of dizziness EXAM: CHEST - 2 VIEW COMPARISON:  03/24/2018 FINDINGS: Cardiomediastinal silhouette unchanged in size and contour. No evidence of central vascular congestion. No pneumothorax or pleural effusion. No confluent airspace disease. Coarsened interstitial markings are similar to prior. No acute displaced fracture IMPRESSION: Her negative for acute cardiopulmonary disease Electronically Signed   By: Corrie Mckusick D.O.   On: 05/08/2018 14:17   The 10-year ASCVD risk score Mikey Bussing DC Jr., et al., 2013) is: 7.9%   Values used to calculate the score:     Age: 43 years     Sex: Male     Is Non-Hispanic African American: Yes     Diabetic: No     Tobacco smoker: No     Systolic Blood Pressure: 585 mmHg     Is BP treated: No     HDL Cholesterol: 43 mg/dL     Total Cholesterol: 231 mg/dL     ASSESSMENT AND PLAN:  Orders Placed This Encounter  Procedures  . DG Chest 2 View    Standing Status:   Standing    Number of Occurrences:   1    Order Specific Question:   Reason for Exam (SYMPTOM  OR DIAGNOSIS REQUIRED)    Answer:   Dizziness, bradycardia, history of  drug abuse.  . CBC    Standing Status:   Standing    Number of Occurrences:   1  . POCT urinalysis dip (device)    Standing Status:   Standing    Number of Occurrences:   1  . I-STAT, chem 8    Standing Status:   Standing    Number of Occurrences:   1  . ED EKG    Standing Status:   Standing    Number of Occurrences:   1    Order Specific Question:   Reason for Exam    Answer:   Weakness     Dizziness  Sinus bradycardia: Patient dizzy however VSS and he is not orthostatic.  There is no evidence of sinus bradycardia.  I have spoken with Dr. Stanford Breed who is one with cardiology.  He has reviewed the EKG and he feels the EKG shows sinus brady, which is chronic, and does not feel that the patient needs to be seen in the ED for emergent eval.  I will refer to cards.        The patient is advised to call or return to clinic if he does not see an improvement in symptoms, or to seek the care of the closest emergency department if he worsens with the above plan.   Philis Fendt, MHS, PA-C 05/08/2018 3:13 PM    Tereasa Coop, PA-C 05/08/18 1515

## 2018-05-08 NOTE — ED Triage Notes (Signed)
Pt sts dizziness x 1 week worse today and worse with position change; pt sts vomiting x 1 today

## 2018-05-08 NOTE — Discharge Instructions (Addendum)
If you dizziness gets worse, or you develop chest pain, shortness of breath or dizziness, then please call [phone].  Please call Dr. Jacalyn Lefevre office (the cardiologist with whom I spoke) to get an appointment with cardiology.   Please drink lots of water and you can take zofran as needed for nausea.

## 2018-05-10 ENCOUNTER — Ambulatory Visit
Admission: RE | Admit: 2018-05-10 | Discharge: 2018-05-10 | Disposition: A | Discharge status: Home / Self Care | Payer: Medicaid Other | Source: Ambulatory Visit | Attending: Orthopedic Surgery | Admitting: Orthopedic Surgery

## 2018-05-10 ENCOUNTER — Other Ambulatory Visit: Payer: Medicaid Other

## 2018-05-10 DIAGNOSIS — M5410 Radiculopathy, site unspecified: Secondary | ICD-10-CM

## 2018-05-14 ENCOUNTER — Inpatient Hospital Stay
Admission: RE | Admit: 2018-05-14 | Discharge: 2018-05-14 | Disposition: A | Discharge status: Home / Self Care | Payer: Medicaid Other | Source: Ambulatory Visit | Attending: Orthopedic Surgery | Admitting: Orthopedic Surgery

## 2018-06-07 ENCOUNTER — Ambulatory Visit (INDEPENDENT_AMBULATORY_CARE_PROVIDER_SITE_OTHER): Payer: Medicaid Other | Admitting: Physician Assistant

## 2018-06-07 ENCOUNTER — Other Ambulatory Visit: Payer: Self-pay

## 2018-06-07 ENCOUNTER — Encounter (INDEPENDENT_AMBULATORY_CARE_PROVIDER_SITE_OTHER): Payer: Self-pay | Admitting: Physician Assistant

## 2018-06-07 VITALS — BP 132/76 | HR 54 | Temp 97.9°F | Ht 69.0 in | Wt 184.0 lb

## 2018-06-07 DIAGNOSIS — M25512 Pain in left shoulder: Secondary | ICD-10-CM

## 2018-06-07 DIAGNOSIS — M25511 Pain in right shoulder: Secondary | ICD-10-CM | POA: Diagnosis not present

## 2018-06-07 DIAGNOSIS — R001 Bradycardia, unspecified: Secondary | ICD-10-CM

## 2018-06-07 DIAGNOSIS — G8929 Other chronic pain: Secondary | ICD-10-CM

## 2018-06-07 NOTE — Patient Instructions (Signed)
Bradycardia, Adult °Bradycardia is a slower-than-normal heartbeat. A normal resting heart rate for an adult ranges from 60 to 100 beats per minute. With bradycardia, the resting heart rate is less than 60 beats per minute. °Bradycardia can prevent enough oxygen from reaching certain areas of your body when you are active. It can be serious if it keeps enough oxygen from reaching your brain and other parts of your body. Bradycardia is not a problem for everyone. For some healthy adults, a slow resting heart rate is normal. °What are the causes? °This condition may be caused by: °· A problem with the heart, including: °? A problem with the heart's electrical system, such as a heart block. °? A problem with the heart's natural pacemaker (sinus node). °? Heart disease. °? A heart attack. °? Heart damage. °? A heart infection. °? A heart condition that is present at birth (congenital heart defect). °· Certain medicines that treat heart conditions. °· Certain conditions, such as hypothyroidism and obstructive sleep apnea. °· Problems with the balance of chemicals and other substances, like potassium, in the blood. ° °What increases the risk? °This condition is more likely to develop in adults who: °· Are age 65 or older. °· Have high blood pressure (hypertension), high cholesterol (hyperlipidemia), or diabetes. °· Drink heavily, use tobacco or nicotine products, or use drugs. °· Are stressed. ° °What are the signs or symptoms? °Symptoms of this condition include: °· Light-headedness. °· Feeling faint or fainting. °· Fatigue and weakness. °· Shortness of breath. °· Chest pain (angina). °· Drowsiness. °· Confusion. °· Dizziness. ° °How is this diagnosed? °This condition may be diagnosed based on: °· Your symptoms. °· Your medical history. °· A physical exam. ° °During the exam, your health care provider will listen to your heartbeat and check your pulse. To confirm the diagnosis, your health care provider may order tests,  such as: °· Blood tests. °· An electrocardiogram (ECG). This test records the heart's electrical activity. The test can show how fast your heart is beating and whether the heartbeat is steady. °· A test in which you wear a portable device (event recorder or Holter monitor) to record your heart's electrical activity while you go about your day. °· An exercise test. ° °How is this treated? °Treatment for this condition depends on the cause of the condition and how severe your symptoms are. Treatment may involve: °· Treatment of the underlying condition. °· Changing your medicines or how much medicine you take. °· Having a small, battery-operated device called a pacemaker implanted under the skin. When bradycardia occurs, this device can be used to increase your heart rate and help your heart to beat in a regular rhythm. ° °Follow these instructions at home: °Lifestyle ° °· Manage any health conditions that contribute to bradycardia as told by your health care provider. °· Follow a heart-healthy diet. A nutrition specialist (dietitian) can help to educate you about healthy food options and changes. °· Follow an exercise program that is approved by your health care provider. °· Maintain a healthy weight. °· Try to reduce or manage your stress, such as with yoga or meditation. If you need help reducing stress, ask your health care provider. °· Do not use use any products that contain nicotine or tobacco, such as cigarettes and e-cigarettes. If you need help quitting, ask your health care provider. °· Do not use illegal drugs. °· Limit alcohol intake to no more than 1 drink per day for nonpregnant women and 2 drinks per   day for men. One drink equals 12 oz of beer, 5 oz of wine, or 1½ oz of hard liquor. °General instructions °· Take over-the-counter and prescription medicines only as told by your health care provider. °· Keep all follow-up visits as directed by your health care provider. This is important. °How is this  prevented? °In some cases, bradycardia may be prevented by: °· Treating underlying medical problems. °· Stopping behaviors or medicines that can trigger the condition. ° °Contact a health care provider if: °· You feel light-headed or dizzy. °· You almost faint. °· You feel weak or are easily fatigued during physical activity. °· You experience confusion or have memory problems. °Get help right away if: °· You faint. °· You have an irregular heartbeat (palpitations). °· You have chest pain. °· You have trouble breathing. °This information is not intended to replace advice given to you by your health care provider. Make sure you discuss any questions you have with your health care provider. °Document Released: 08/30/2002 Document Revised: 08/05/2016 Document Reviewed: 05/29/2016 °Elsevier Interactive Patient Education © 2017 [employer] ° °

## 2018-06-07 NOTE — Progress Notes (Signed)
Subjective:  Patient ID: Garrett Clark, male    DOB: 1961/10/23  Age: 57 y.o. MRN: [id number]  CC: left arm f/u  HPI Garrett Clark is [id number] y.o. male with a medical historyof HLD, CKD, internal hemorrhoids, cocaine use,and LBP presentsto f/u on weakness of both arms.Was referred to neurology for weakness of both upper extremities L > R. Was seen at neurology on 03/29/2018 with subsequent EMG that revealed no branchial plexopathy or nerve impingement to account for his paresthesia and pain. MRI revealed mild degenerative changes in the cervical spine without stenosis or neural impingement. Neurologist concluded a likely MSK etiology. 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 Recommendation for MRI arthrogram. He is to return to orthopedics after MRI. Pt says medicaid has not approved of his MRI. Working on having MRI approved. Pain is mildly relieved with gabapentin but is still severe at time. Loses grip of items he is holding. Experiences sudden sharp pain along the proximal forearm. Still has pelvic pain for which the orthopedic specialist has also ordered an MRI but has not been approved by insurance. Does not endorse any other symptoms or complaints.     Outpatient Medications Prior to Visit  Medication Sig Dispense Refill  . cyclobenzaprine (FLEXERIL) 5 MG tablet Take 1 tablet (5 mg total) by mouth at bedtime as needed for muscle spasms. 30 tablet 5  . gabapentin (NEURONTIN) 300 MG capsule Take 1 capsule (300 mg total) by mouth 3 (three) times daily. (Patient taking differently: Take 300 mg by mouth 2 (two) times daily. ) 90 capsule 3  . acetaminophen (TYLENOL) 500 MG tablet Take 500 mg by mouth every 6 (six) hours as needed.    . ondansetron (ZOFRAN) 4 MG tablet Take 1 tablet (4 mg total) by mouth every 6 (six) hours. (Patient not taking: Reported on 06/07/2018) 12 tablet 0   No facility-administered medications prior to  visit.      ROS Review of Systems  Constitutional: Negative for chills, fever and malaise/fatigue.  Eyes: Negative for blurred vision.  Respiratory: Negative for shortness of breath.   Cardiovascular: Negative for chest pain and palpitations.  Gastrointestinal: Negative for abdominal pain and nausea.  Genitourinary: Negative for dysuria and hematuria.  Musculoskeletal: Positive for joint pain. Negative for myalgias.  Skin: Negative for rash.  Neurological: Negative for tingling and headaches.  Psychiatric/Behavioral: Negative for depression. The patient is not nervous/anxious.     Objective:  BP (!) 149/85 (BP Location: Left Arm, Patient Position: Sitting, Cuff Size: Normal)   Pulse (!) 54   Temp 97.9 F (36.6 C) (Oral)   Ht 5\' 9"  (1.753 m)   Wt 184 lb (83.5 kg)   SpO2 95%   BMI 27.17 kg/m   BP/Weight 06/07/2018 05/08/2018 3/76/2831  Systolic BP 517 616 073  Diastolic BP 85 61 86  Wt. (Lbs) 184 - 185  BMI 27.17 - 27.32      Physical Exam  Constitutional: He is oriented to person, place, and time.  Well developed, well nourished, NAD, polite  HENT:  Head: Normocephalic and atraumatic.  Eyes: No scleral icterus.  Neck: Normal range of motion. Neck supple. No thyromegaly present.  Cardiovascular: Regular rhythm, normal heart sounds and intact distal pulses. Exam reveals no gallop and no friction rub.  No murmur heard. Bradycardic.  Pulmonary/Chest: Effort normal and breath sounds normal.  Musculoskeletal: He exhibits no edema.  Neurological: He is alert and oriented to person,  place, and time.  Skin: Skin is warm and dry. No rash noted. No erythema. No pallor.  Psychiatric: He has a normal mood and affect. His behavior is normal. Thought content normal.  Vitals reviewed.    Assessment & Plan:   1. Chronic pain of both shoulders - Needs approval from medicaid for MRI arthrogram - Ambulatory referral to [place] care with orthopedics  2.  Bradycardia - NSR - Ambulatory referral to Cardiology   Follow-up: Return if symptoms worsen or fail to improve.   Clent Demark PA

## 2018-06-11 ENCOUNTER — Other Ambulatory Visit: Payer: Self-pay | Admitting: Urology

## 2018-06-11 DIAGNOSIS — C642 Malignant neoplasm of left kidney, except renal pelvis: Secondary | ICD-10-CM

## 2018-06-21 ENCOUNTER — Ambulatory Visit (HOSPITAL_COMMUNITY)
Admission: RE | Admit: 2018-06-21 | Discharge: 2018-06-21 | Disposition: A | Discharge status: Home / Self Care | Payer: Medicaid Other | Source: Ambulatory Visit | Attending: Urology | Admitting: Urology

## 2018-06-21 DIAGNOSIS — C642 Malignant neoplasm of left kidney, except renal pelvis: Secondary | ICD-10-CM | POA: Diagnosis present

## 2018-06-21 DIAGNOSIS — K7689 Other specified diseases of liver: Secondary | ICD-10-CM | POA: Diagnosis not present

## 2018-06-21 DIAGNOSIS — Z905 Acquired absence of kidney: Secondary | ICD-10-CM | POA: Diagnosis not present

## 2018-06-23 ENCOUNTER — Ambulatory Visit: Payer: Medicaid Other | Admitting: Urology

## 2018-06-23 DIAGNOSIS — M5489 Other dorsalgia: Secondary | ICD-10-CM | POA: Diagnosis not present

## 2018-06-23 DIAGNOSIS — C642 Malignant neoplasm of left kidney, except renal pelvis: Secondary | ICD-10-CM | POA: Diagnosis not present

## 2018-07-06 DIAGNOSIS — R3129 Other microscopic hematuria: Secondary | ICD-10-CM | POA: Diagnosis not present

## 2018-07-14 DIAGNOSIS — M542 Cervicalgia: Secondary | ICD-10-CM | POA: Diagnosis not present

## 2018-07-14 DIAGNOSIS — M25561 Pain in right knee: Secondary | ICD-10-CM | POA: Diagnosis not present

## 2018-07-14 DIAGNOSIS — M545 Low back pain: Secondary | ICD-10-CM | POA: Diagnosis not present

## 2018-07-14 DIAGNOSIS — G8929 Other chronic pain: Secondary | ICD-10-CM | POA: Diagnosis not present

## 2018-07-14 IMAGING — DX DG CHEST 2V
2 series · 2 of 2 positions shown · non-contrast
Comparison: 01/20/2017

CLINICAL DATA: Chest pain

EXAM:
CHEST - 2 VIEW

[chest pa]
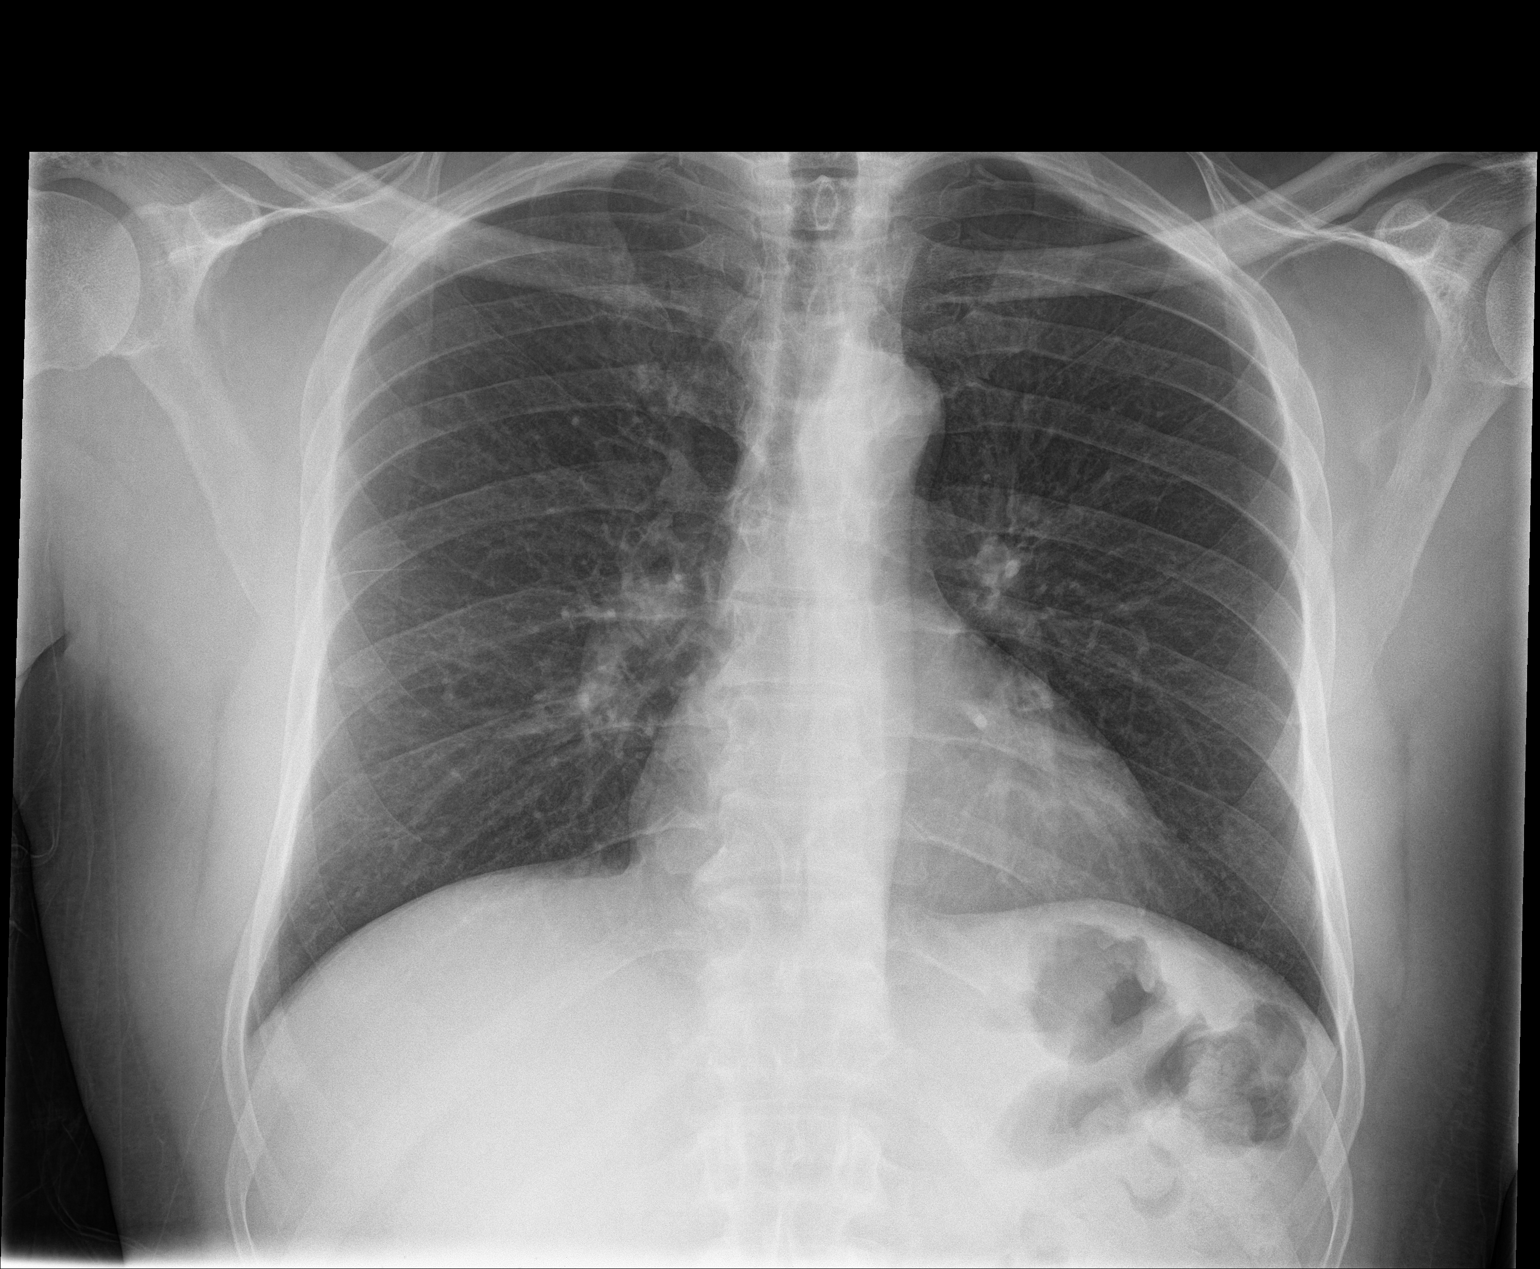

[chest lat]
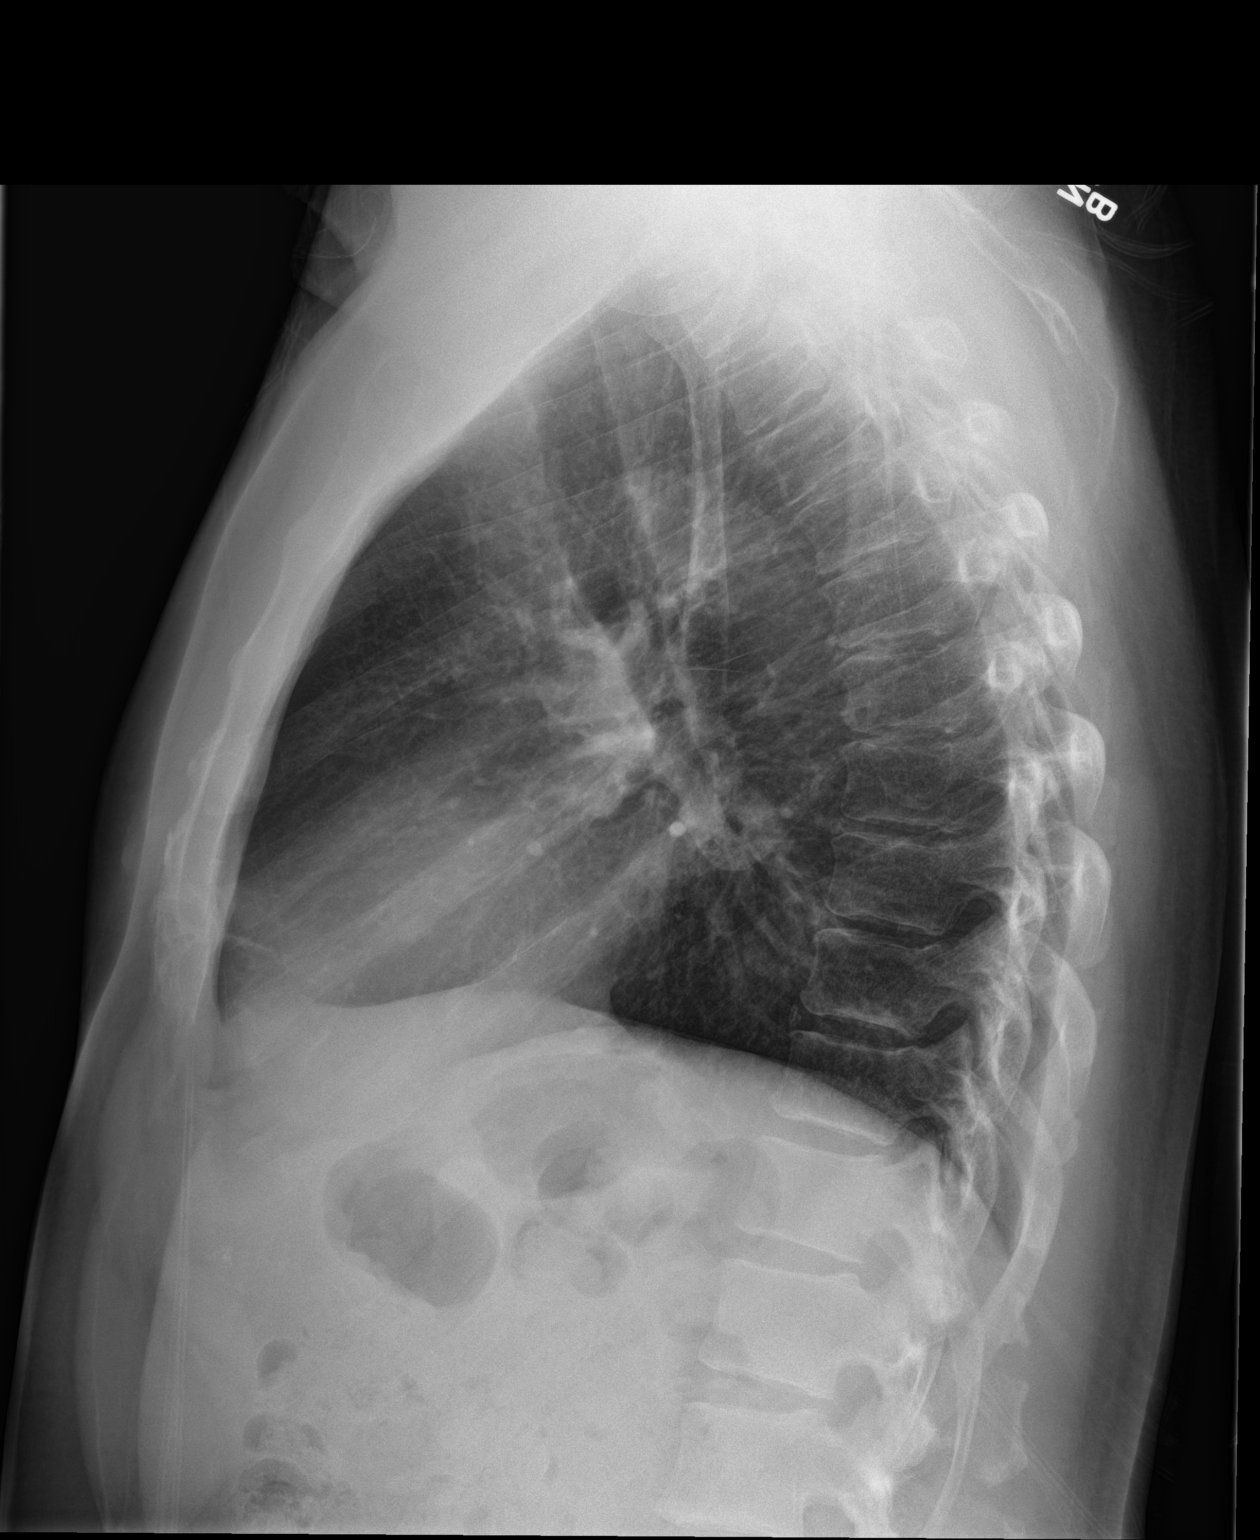

[2 of 2 positions shown; findings below may reference images not displayed]

FINDINGS: Normal heart size. Lungs clear. No pneumothorax. No pleural
effusion.
IMPRESSION: No active cardiopulmonary disease.

## 2018-07-21 DIAGNOSIS — N50812 Left testicular pain: Secondary | ICD-10-CM | POA: Diagnosis not present

## 2018-07-21 NOTE — Progress Notes (Signed)
Referring-Roger Roderic Ovens, PA-C Reason for referral-bradycardia  HPI: 57 year old male for evaluation of bradycardia at request of Clent Demark, PA-C.  Seen recently for weakness in both upper extremities.  Patient noted to be bradycardic and cardiology asked to evaluate.  Patient woke up several months ago with dizziness, nausea and general malaise.  He went to primary care and was noted to be bradycardic and cardiology asked to evaluate.  Patient states he has been told he has had a slow heart rate for years.  He denies dyspnea on exertion, orthopnea, PND, pedal edema or exertional chest pain.  He has chest pain approximately 1 time every 4 months.  The pain is in the left breast area without radiation.  It can last all day.  No associated symptoms.  He does not have syncope but occasional dizzy spells with standing.  Current Outpatient Medications  Medication Sig Dispense Refill  . BUTRANS 10 MCG/HR PTWK patch Place 10 mcg onto the skin once a week.  0  . cyclobenzaprine (FLEXERIL) 5 MG tablet Take 1 tablet (5 mg total) by mouth at bedtime as needed for muscle spasms. 30 tablet 5  . gabapentin (NEURONTIN) 300 MG capsule Take 1 capsule (300 mg total) by mouth 3 (three) times daily. (Patient taking differently: Take 300 mg by mouth 2 (two) times daily. ) 90 capsule 3  . acetaminophen (TYLENOL) 500 MG tablet Take 500 mg by mouth every 6 (six) hours as needed.     No current facility-administered medications for this visit.     No Known Allergies   Past Medical History:  Diagnosis Date  . Cancer ([place])    Left kidney  . Hyperlipidemia   . Internal hemorrhoids   . Polycystic liver disease   . Substance abuse [facility])     Past Surgical History:  Procedure Laterality Date  . COLONOSCOPY    . HEMORRHOID BANDING    . ROBOT ASSISTED LAPAROSCOPIC NEPHRECTOMY Left 12/07/2017   Procedure: XI ROBOTIC ASSISTED LAPAROSCOPIC NEPHRECTOMY;  Surgeon: Cleon Gustin, MD;  Location: [facility]  [facility];  Service: Urology;  Laterality: Left;    Social History   Socioeconomic History  . Marital status: Single    Spouse name: Not on file  . Number of children: 2  . Years of education: 2  . Highest education level: Not on file  Occupational History    Employer: [employer]  . Financial resource strain: Not on file  . Food insecurity:    Worry: Not on file    Inability: Not on file  . Transportation needs:    Medical: Not on file    Non-medical: Not on file  Tobacco Use  . Smoking status: Former Smoker    Types: Cigarettes    Last attempt to quit: 12/03/2012    Years since quitting: 5.6  . Smokeless tobacco: Never Used  Substance and Sexual Activity  . Alcohol use: Yes    Comment: Occasional  . 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 Sexual activity: Not on file  Lifestyle  . Physical activity:    Days per week: Not on file    Minutes per session: Not on file  . Stress: Not on file  Relationships  . Social connections:    Talks on phone: Not on  file    Gets together: Not on file    Attends religious service: Not on file    Active member of club or organization: Not on file    Attends meetings of clubs or organizations: Not on file    Relationship status: Not on file  . Intimate partner violence:    Fear of current or ex partner: Not on file    Emotionally abused: Not on file    Physically abused: Not on file    Forced sexual activity: Not on file  Other Topics Concern  . Not on file  Social History Narrative   Lives with girlfriend and 87 year old daughter.  Has 2 children.  Works at [employer].  Education: high school.     Family History  Problem Relation Age of Onset  . Diabetes Mother   . Hypertension Mother   . Alcohol abuse Mother   . Diabetes Father   . Kidney disease Father   . Diabetes Sister   . Stroke Sister     . Diabetes Paternal Grandmother   . Cancer Paternal Grandfather        type unknown  . Colon cancer Neg Hx     ROS: no fevers or chills, productive cough, hemoptysis, dysphasia, odynophagia, melena, hematochezia, dysuria, hematuria, rash, seizure activity, orthopnea, PND, pedal edema, claudication. Remaining systems are negative.  Physical Exam:   Blood pressure 134/72, pulse (!) 45, height 5\' 9"  (1.753 m), weight 180 lb 6.4 oz (81.8 kg).  General:  Well developed/well nourished in NAD Skin warm/dry Patient not depressed No peripheral clubbing Back-normal HEENT-normal/normal eyelids Neck supple/normal carotid upstroke bilaterally; no bruits; no JVD; no thyromegaly chest - CTA/ normal expansion CV - RRR/normal S1 and S2; no murmurs, rubs or gallops;  PMI nondisplaced Abdomen -NT/ND, no HSM, no mass, + bowel sounds, no bruit 2+ femoral pulses, no bruits Ext-no edema, chords, 2+ DP Neuro-grossly nonfocal  ECG -May 08, 2018-sinus bradycardia at a rate of 44.  Personally reviewed ECG today shows sinus bradycardia with no ST changes  A/P  1 bradycardia-patient appears to be asymptomatic.  I do not think he would likely require further therapy for this.  I will arrange an echocardiogram to assess LV function.  Check TSH.   2 chest pain-symptoms are atypical.  I will arrange an exercise treadmill both for risk stratification and to evaluate chronotropic competence.  3 hyperlipidemia-management per primary care.  Kirk Ruths, MD

## 2018-07-27 ENCOUNTER — Ambulatory Visit (INDEPENDENT_AMBULATORY_CARE_PROVIDER_SITE_OTHER): Payer: Medicaid Other | Admitting: Cardiology

## 2018-07-27 ENCOUNTER — Encounter: Payer: Self-pay | Admitting: Cardiology

## 2018-07-27 VITALS — BP 134/72 | HR 45 | Ht 69.0 in | Wt 180.4 lb

## 2018-07-27 DIAGNOSIS — E7800 Pure hypercholesterolemia, unspecified: Secondary | ICD-10-CM

## 2018-07-27 DIAGNOSIS — R072 Precordial pain: Secondary | ICD-10-CM | POA: Diagnosis not present

## 2018-07-27 DIAGNOSIS — R001 Bradycardia, unspecified: Secondary | ICD-10-CM | POA: Diagnosis not present

## 2018-07-27 NOTE — Patient Instructions (Signed)
Medication Instructions:   NO CHANGE  Labwork:  Your physician recommends that you HAVE LAB WORK TODAY  Testing/Procedures:  Your physician has requested that you have an echocardiogram. Echocardiography is a painless test that uses sound waves to create images of your heart. It provides your doctor with information about the size and shape of your heart and how well your heart's chambers and valves are working. This procedure takes approximately one hour. There are no restrictions for this procedure.   Your physician has requested that you have an exercise tolerance test. For further information please visit [email or web address]. Please also follow instruction sheet, as given.    Follow-Up:  Your physician recommends that you schedule a follow-up appointment in: AS NEEDED PENDING TEST RESULTS

## 2018-07-28 ENCOUNTER — Encounter: Payer: Self-pay | Admitting: *Deleted

## 2018-07-28 DIAGNOSIS — M542 Cervicalgia: Secondary | ICD-10-CM | POA: Diagnosis not present

## 2018-07-28 DIAGNOSIS — G8929 Other chronic pain: Secondary | ICD-10-CM | POA: Diagnosis not present

## 2018-07-28 DIAGNOSIS — M25561 Pain in right knee: Secondary | ICD-10-CM | POA: Diagnosis not present

## 2018-07-28 DIAGNOSIS — M549 Dorsalgia, unspecified: Secondary | ICD-10-CM | POA: Diagnosis not present

## 2018-07-28 LAB — TSH: TSH: 0.602 u[IU]/mL (ref 0.450–4.500)

## 2018-07-29 ENCOUNTER — Ambulatory Visit (INDEPENDENT_AMBULATORY_CARE_PROVIDER_SITE_OTHER): Payer: Medicaid Other | Admitting: Physician Assistant

## 2018-07-29 ENCOUNTER — Telehealth (HOSPITAL_COMMUNITY): Payer: Self-pay

## 2018-07-29 ENCOUNTER — Other Ambulatory Visit: Payer: Self-pay

## 2018-07-29 ENCOUNTER — Encounter (INDEPENDENT_AMBULATORY_CARE_PROVIDER_SITE_OTHER): Payer: Self-pay | Admitting: Physician Assistant

## 2018-07-29 VITALS — BP 161/79 | HR 41 | Temp 97.9°F | Ht 69.0 in | Wt 179.8 lb

## 2018-07-29 DIAGNOSIS — L299 Pruritus, unspecified: Secondary | ICD-10-CM

## 2018-07-29 DIAGNOSIS — E7841 Elevated Lipoprotein(a): Secondary | ICD-10-CM | POA: Diagnosis not present

## 2018-07-29 DIAGNOSIS — L989 Disorder of the skin and subcutaneous tissue, unspecified: Secondary | ICD-10-CM | POA: Diagnosis not present

## 2018-07-29 DIAGNOSIS — K625 Hemorrhage of anus and rectum: Secondary | ICD-10-CM | POA: Diagnosis not present

## 2018-07-29 MED ORDER — HYDROXYZINE HCL 25 MG PO TABS: 25.0000 mg | ORAL_TABLET | Freq: Two times a day (BID) | ORAL | 0 refills | Status: DC | PRN

## 2018-07-29 MED ORDER — ATORVASTATIN CALCIUM 40 MG PO TABS: 40.0000 mg | ORAL_TABLET | Freq: Every day | ORAL | 3 refills | Status: DC

## 2018-07-29 NOTE — Progress Notes (Signed)
Subjective:  Patient ID: Garrett Clark, male    DOB: 08-31-1961  Age: 57 y.o. MRN: [id number]  CC: bumps on arms. Rectal bleeding  HPI Garrett Clark is [id number] y.o. male with a medical historyof HLD,CKD,internal hemorrhoids, cocaine use,and LBP presentswith complaint of bumps on arm and rectal bleeding. Bumps interdigitally and on arms. Says his brother also has the same itching and bumps. Brother lives with him in the same house. Does not think there may be a mite infestation because other household members do not have lesions or itch. Lesion occur only in the summer time or in hot ambient temperatures. There are no lesions or itching elsewhere in the body. Denies f/c/n/v, swelling, arthralgias, or myalgias.     Also states he had one episode of rectal bleeding last week. Wiped after defecation and noted some blood. Has a history of internal hemorrhoids revealed by colonoscopy last year. Colonoscopy otherwise normal. No continued bleeding, no rectal pain, no abdominal pain/bloating. Does not endorse any other symptoms or complaints.     Outpatient Medications Prior to Visit  Medication Sig Dispense Refill  . BUTRANS 10 MCG/HR PTWK patch Place 10 mcg onto the skin once a week.  0  . cyclobenzaprine (FLEXERIL) 5 MG tablet Take 1 tablet (5 mg total) by mouth at bedtime as needed for muscle spasms. 30 tablet 5  . gabapentin (NEURONTIN) 300 MG capsule Take 1 capsule (300 mg total) by mouth 3 (three) times daily. (Patient taking differently: Take 300 mg by mouth 2 (two) times daily. ) 90 capsule 3  . acetaminophen (TYLENOL) 500 MG tablet Take 500 mg by mouth every 6 (six) hours as needed.     No facility-administered medications prior to visit.      ROS Review of Systems  Constitutional: Negative for chills, fever and malaise/fatigue.  Eyes: Negative for blurred vision.  Respiratory: Negative for shortness of breath.   Cardiovascular: Negative for chest pain and palpitations.   Gastrointestinal: Negative for abdominal pain and nausea.  Genitourinary: Negative for dysuria and hematuria.  Musculoskeletal: Negative for joint pain and myalgias.  Skin: Negative for rash.  Neurological: Negative for tingling and headaches.  Psychiatric/Behavioral: Negative for depression. The patient is not nervous/anxious.     Objective:  BP (!) 161/79 (BP Location: Left Arm, Patient Position: Sitting, Cuff Size: Normal)   Pulse (!) 41   Temp 97.9 F (36.6 C) (Oral)   Ht 5\' 9"  (1.753 m)   Wt 179 lb 12.8 oz (81.6 kg)   SpO2 99%   BMI 26.55 kg/m   BP/Weight 07/29/2018 07/27/2018 7/68/1157  Systolic BP 262 035 597  Diastolic BP 79 72 76  Wt. (Lbs) 179.8 180.4 184  BMI 26.55 26.64 27.17      Physical Exam  Constitutional: He is oriented to person, place, and time.  Well developed, well nourished, NAD, polite  HENT:  Head: Normocephalic and atraumatic.  Eyes: No scleral icterus.  Neck: Normal range of motion. Neck supple. No thyromegaly present.  Cardiovascular: Normal rate, regular rhythm and normal heart sounds.  Pulmonary/Chest: Effort normal and breath sounds normal.  Musculoskeletal: He exhibits no edema.  Neurological: He is alert and oriented to person, place, and time.  Skin: Skin is warm and dry. No rash noted. No erythema. No pallor.  Several post inflammatory hyperpigmented lesions on arm bilaterally. No sign of erythema, edema, induration, suppuration, crusting, scaling, or bleeding.   Psychiatric: He has a normal mood and affect. His behavior is normal. Thought  content normal.  Vitals reviewed.    Assessment & Plan:   1. Skin lesions - Begin hydrOXYzine (ATARAX/VISTARIL) 25 MG tablet; Take 1 tablet (25 mg total) by mouth 2 (two) times daily as needed.  Dispense: 30 tablet; Refill: 0 - Ambulatory referral to Dermatology  2. Pruritus - Begin hydrOXYzine (ATARAX/VISTARIL) 25 MG tablet; Take 1 tablet (25 mg total) by mouth 2 (two) times daily as needed.   Dispense: 30 tablet; Refill: 0  3. Elevated lipoprotein(a) - Begin atorvastatin (LIPITOR) 40 MG tablet; Take 1 tablet (40 mg total) by mouth daily.  Dispense: 90 tablet; Refill: 3  4. Rectal bleeding - Hx of internal hemorrhoids by colonocopy. Colonoscopy otherwise normal last year. Advised to call if there are any other episodes of bleeding.      Meds ordered this encounter  Medications  . atorvastatin (LIPITOR) 40 MG tablet    Sig: Take 1 tablet (40 mg total) by mouth daily.    Dispense:  90 tablet    Refill:  3    Order Specific Question:   Supervising Provider    Answer:   Charlott Rakes [[id number]]  . hydrOXYzine (ATARAX/VISTARIL) 25 MG tablet    Sig: Take 1 tablet (25 mg total) by mouth 2 (two) times daily as needed.    Dispense:  30 tablet    Refill:  0    Order Specific Question:   Supervising Provider    Answer:   Charlott Rakes [[id number]]    Follow-up: Return for after dermatology.   Clent Demark PA

## 2018-07-29 NOTE — Telephone Encounter (Signed)
Encounter complete. 

## 2018-07-29 NOTE — Patient Instructions (Signed)
 Dyslipidemia Dyslipidemia is an imbalance of waxy, fat-like substances (lipids) in the blood. The body needs lipids in small amounts. Dyslipidemia often involves a high level of cholesterol or triglycerides, which are types of lipids. Common forms of dyslipidemia include:  High levels of bad cholesterol (LDL cholesterol). LDL is the type of cholesterol that causes fatty deposits (plaques) to build up in the blood vessels that carry blood away from your heart (arteries).  Low levels of good cholesterol (HDL cholesterol). HDL cholesterol is the type of cholesterol that protects against heart disease. High levels of HDL remove the LDL buildup from arteries.  High levels of triglycerides. Triglycerides are a fatty substance in the blood that is linked to a buildup of plaques in the arteries.  You can develop dyslipidemia because of the genes you are born with (primary dyslipidemia) or changes that occur during your life (secondary dyslipidemia), or as a side effect of certain medical treatments. What are the causes? Primary dyslipidemia is caused by changes (mutations) in genes that are passed down through families (inherited). These mutations cause several types of dyslipidemia. Mutations can result in disorders that make the body produce too much LDL cholesterol or triglycerides, or not enough HDL cholesterol. These disorders may lead to heart disease, arterial disease, or stroke at an early age. Causes of secondary dyslipidemia include certain lifestyle choices and diseases that lead to dyslipidemia, such as:  Eating a diet that is high in animal fat.  Not getting enough activity or exercise (having a sedentary lifestyle).  Having diabetes, kidney disease, liver disease, or thyroid disease.  Drinking large amounts of alcohol.  Using certain types of drugs.  What increases the risk? You may be at greater risk for dyslipidemia if you are an older man or if you are a woman who has gone  through menopause. Other risk factors include:  Having a family history of dyslipidemia.  Taking certain medicines, including birth control pills, steroids, some diuretics, beta-blockers, and some medicines forHIV.  Smoking cigarettes.  Eating a high-fat diet.  Drinking large amounts of alcohol.  Having certain medical conditions such as diabetes, polycystic ovary syndrome (PCOS), pregnancy, kidney disease, liver disease, or hypothyroidism.  Not exercising regularly.  Being overweight or obese with too much belly fat.  What are the signs or symptoms? Dyslipidemia does not usually cause any symptoms. Very high lipid levels can cause fatty bumps under the skin (xanthomas) or a white or gray ring around the black center (pupil) of the eye. Very high triglyceride levels can cause inflammation of the pancreas (pancreatitis). How is this diagnosed? Your health care provider may diagnose dyslipidemia based on a routine blood test (fasting blood test). Because most people do not have symptoms of the condition, this blood testing (lipid profile) is done on adults age 20 and older and is repeated every 5 years. This test checks:  Total cholesterol. This is a measure of the total amount of cholesterol in your blood, including LDL cholesterol, HDL cholesterol, and triglycerides. A healthy number is below 200.  LDL cholesterol. The target number for LDL cholesterol is different for each person, depending on individual risk factors. For most people, a number below 100 is healthy. Ask your health care provider what your LDL cholesterol number should be.  HDL cholesterol. An HDL level of 60 or higher is best because it helps to protect against heart disease. A number below 40 for men or below 50 for women increases the risk for heart disease.  Triglycerides.   A healthy triglyceride number is below 150.  If your lipid profile is abnormal, your health care provider may do other blood tests to get more  information about your condition. How is this treated? Treatment depends on the type of dyslipidemia that you have and your other risk factors for heart disease and stroke. Your health care provider will have a target range for your lipid levels based on this information. For many people, treatment starts with lifestyle changes, such as diet and exercise. Your health care provider may recommend that you:  Get regular exercise.  Make changes to your diet.  Quit smoking if you smoke.  If diet changes and exercise do not help you reach your goals, your health care provider may also prescribe medicine to lower lipids. The most commonly prescribed type of medicine lowers your LDL cholesterol (statin drug). If you have a high triglyceride level, your provider may prescribe another type of drug (fibrate) or an omega-3 fish oil supplement, or both. Follow these instructions at home:  Take over-the-counter and prescription medicines only as told by your health care provider. This includes supplements.  Get regular exercise. Start an aerobic exercise and strength training program as told by your health care provider. Ask your health care provider what activities are safe for you. Your health care provider may recommend: ? 30 minutes of aerobic activity 4-6 days a week. Brisk walking is an example of aerobic activity. ? Strength training 2 days a week.  Eat a healthy diet as told by your health care provider. This can help you reach and maintain a healthy weight, lower your LDL cholesterol, and raise your HDL cholesterol. It may help to work with a diet and nutrition specialist (dietitian) to make a plan that is right for you. Your dietitian or health care provider may recommend: ? Limiting your calories, if you are overweight. ? Eating more fruits, vegetables, whole grains, fish, and lean meats. ? Limiting saturated fat, trans fat, and cholesterol.  Follow instructions from your health care provider  or dietitian about eating or drinking restrictions.  Limit alcohol intake to no more than one drink per day for nonpregnant women and two drinks per day for men. One drink equals 12 oz of beer, 5 oz of wine, or 1 oz of hard liquor.  Do not use any products that contain nicotine or tobacco, such as cigarettes and e-cigarettes. If you need help quitting, ask your health care provider.  Keep all follow-up visits as told by your health care provider. This is important. Contact a health care provider if:  You are having trouble sticking to your exercise or diet plan.  You are struggling to quit smoking or control your use of alcohol. Summary  Dyslipidemia is an imbalance of waxy, fat-like substances (lipids) in the blood. The body needs lipids in small amounts. Dyslipidemia often involves a high level of cholesterol or triglycerides, which are types of lipids.  Treatment depends on the type of dyslipidemia that you have and your other risk factors for heart disease and stroke.  For many people, treatment starts with lifestyle changes, such as diet and exercise. Your health care provider may also prescribe medicine to lower lipids. This information is not intended to replace advice given to you by your health care provider. Make sure you discuss any questions you have with your health care provider. Document Released: 12/13/2013 Document Revised: 08/04/2016 Document Reviewed: 08/04/2016 Elsevier Interactive Patient Education  2018 Elsevier Inc.  

## 2018-08-02 ENCOUNTER — Ambulatory Visit (HOSPITAL_COMMUNITY): Payer: Medicaid Other | Attending: Cardiology

## 2018-08-03 ENCOUNTER — Ambulatory Visit (HOSPITAL_COMMUNITY)
Admission: RE | Admit: 2018-08-03 | Payer: Medicaid Other | Source: Ambulatory Visit | Attending: Cardiology | Admitting: Cardiology

## 2018-08-09 DIAGNOSIS — N509 Disorder of male genital organs, unspecified: Secondary | ICD-10-CM | POA: Diagnosis not present

## 2018-08-13 ENCOUNTER — Telehealth (HOSPITAL_COMMUNITY): Payer: Self-pay

## 2018-08-13 NOTE — Telephone Encounter (Signed)
Encounter complete. 

## 2018-08-18 ENCOUNTER — Encounter: Payer: Self-pay | Admitting: Cardiology

## 2018-08-18 ENCOUNTER — Ambulatory Visit (HOSPITAL_COMMUNITY)
Admission: RE | Admit: 2018-08-18 | Discharge: 2018-08-18 | Disposition: A | Discharge status: Home / Self Care | Payer: Medicaid Other | Source: Ambulatory Visit | Attending: Internal Medicine | Admitting: Internal Medicine

## 2018-08-18 DIAGNOSIS — R072 Precordial pain: Secondary | ICD-10-CM | POA: Insufficient documentation

## 2018-08-25 ENCOUNTER — Telehealth (INDEPENDENT_AMBULATORY_CARE_PROVIDER_SITE_OTHER): Payer: Self-pay | Admitting: Physician Assistant

## 2018-08-25 DIAGNOSIS — M545 Low back pain: Secondary | ICD-10-CM | POA: Diagnosis not present

## 2018-08-25 DIAGNOSIS — M79651 Pain in right thigh: Secondary | ICD-10-CM | POA: Diagnosis not present

## 2018-08-25 DIAGNOSIS — M25512 Pain in left shoulder: Secondary | ICD-10-CM | POA: Diagnosis not present

## 2018-08-25 DIAGNOSIS — M25561 Pain in right knee: Secondary | ICD-10-CM | POA: Diagnosis not present

## 2018-08-25 NOTE — Telephone Encounter (Signed)
Patient called requesting medication refill for cyclobenzaprine (FLEXERIL) 5 MG tablet   Patient uses [facility] [phone] - [place], [address]  Please Advice [phone]  Thank you Garrett Clark

## 2018-08-25 NOTE — Telephone Encounter (Signed)
Spoke with pharmacy patient has 4 more refills they are filling one now. Patient aware that refill should be ready for pick up in a couple hours. [name], CMA

## 2018-08-26 ENCOUNTER — Other Ambulatory Visit: Payer: Self-pay

## 2018-08-26 ENCOUNTER — Ambulatory Visit (HOSPITAL_COMMUNITY): Payer: Medicaid Other | Attending: Cardiology

## 2018-08-26 ENCOUNTER — Telehealth (HOSPITAL_COMMUNITY): Payer: Self-pay

## 2018-08-26 ENCOUNTER — Encounter (INDEPENDENT_AMBULATORY_CARE_PROVIDER_SITE_OTHER): Payer: Self-pay

## 2018-08-26 DIAGNOSIS — R001 Bradycardia, unspecified: Secondary | ICD-10-CM | POA: Insufficient documentation

## 2018-08-26 DIAGNOSIS — R42 Dizziness and giddiness: Secondary | ICD-10-CM | POA: Diagnosis not present

## 2018-08-26 DIAGNOSIS — R072 Precordial pain: Secondary | ICD-10-CM | POA: Diagnosis not present

## 2018-08-26 DIAGNOSIS — E785 Hyperlipidemia, unspecified: Secondary | ICD-10-CM | POA: Insufficient documentation

## 2018-08-26 NOTE — Telephone Encounter (Signed)
Encounter complete. 

## 2018-08-28 IMAGING — DX DG CHEST 2V
2 series · 2 of 2 positions shown · non-contrast
Comparison: 03/24/2018

CLINICAL DATA: 57-year-old male with a history of dizziness

EXAM:
CHEST - 2 VIEW

[chest pa]
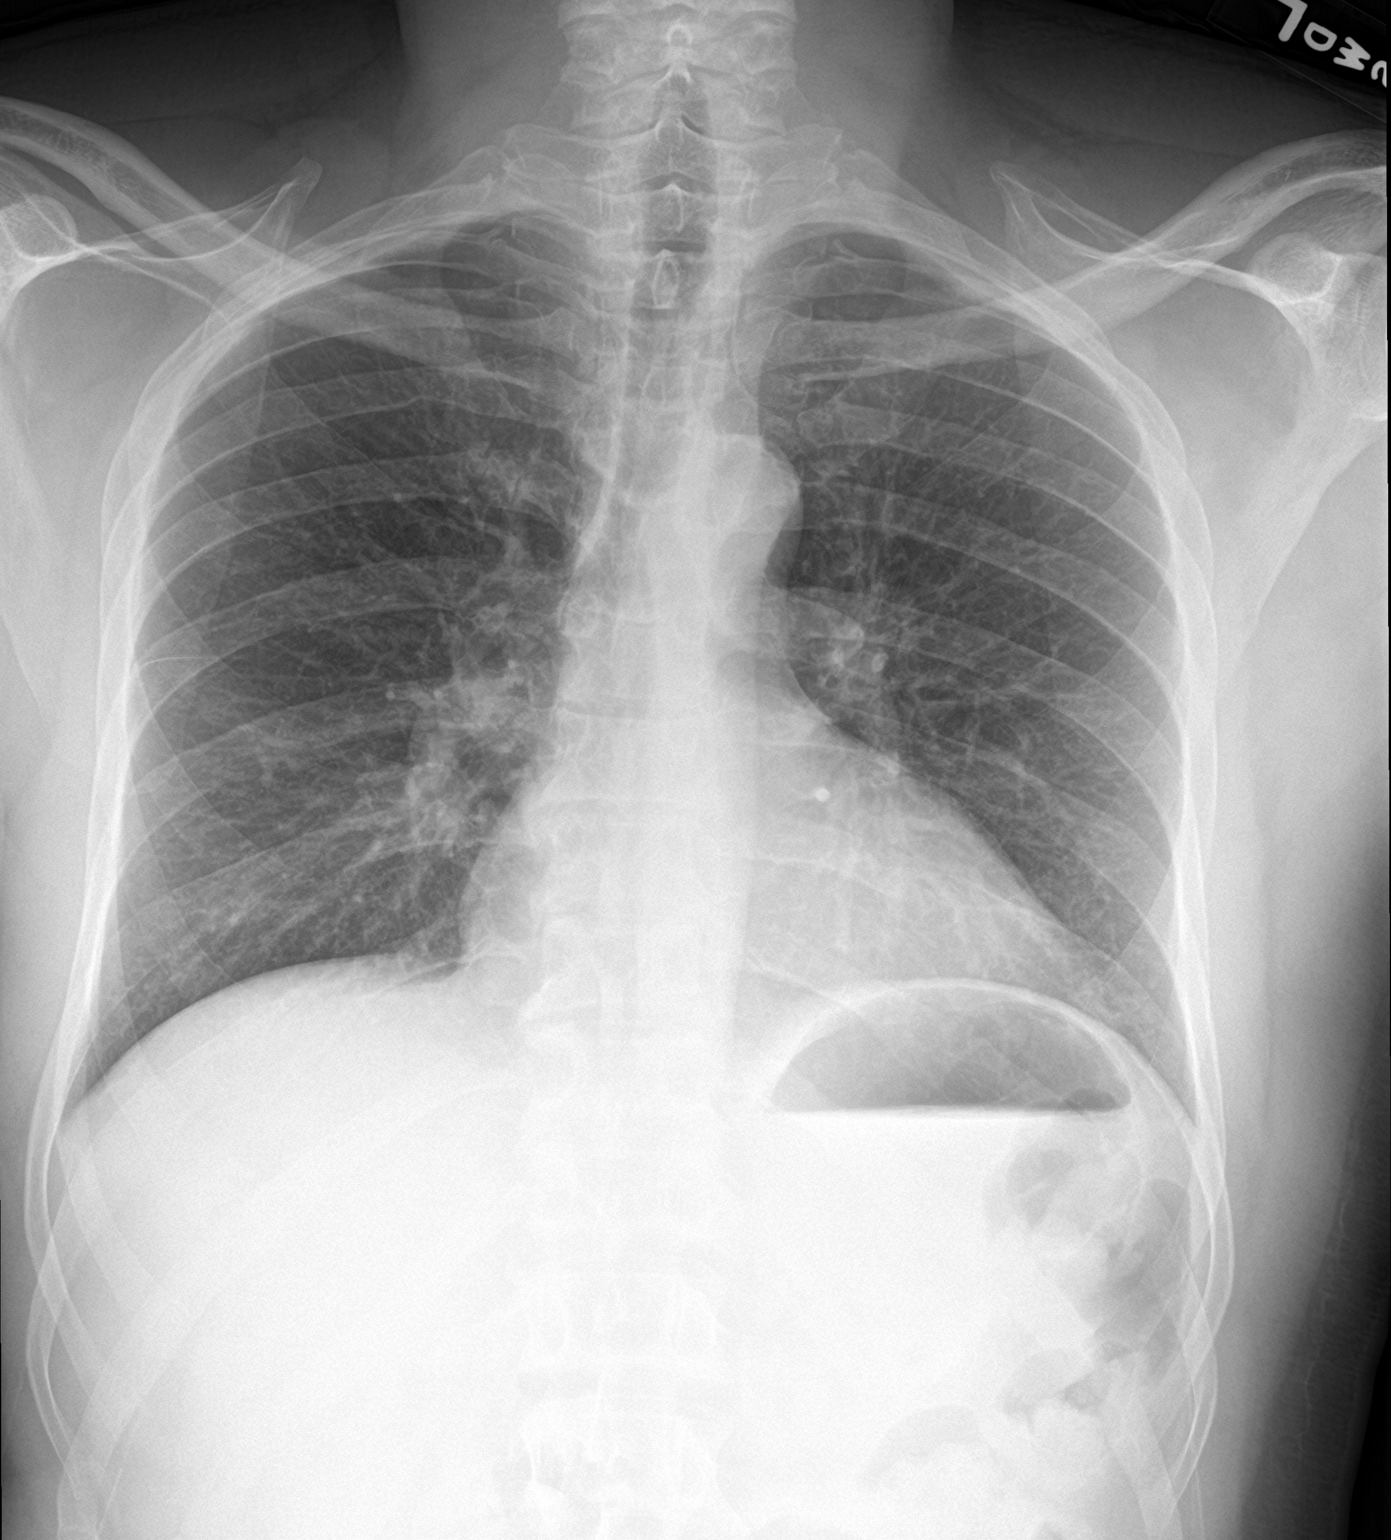

[chest lat]
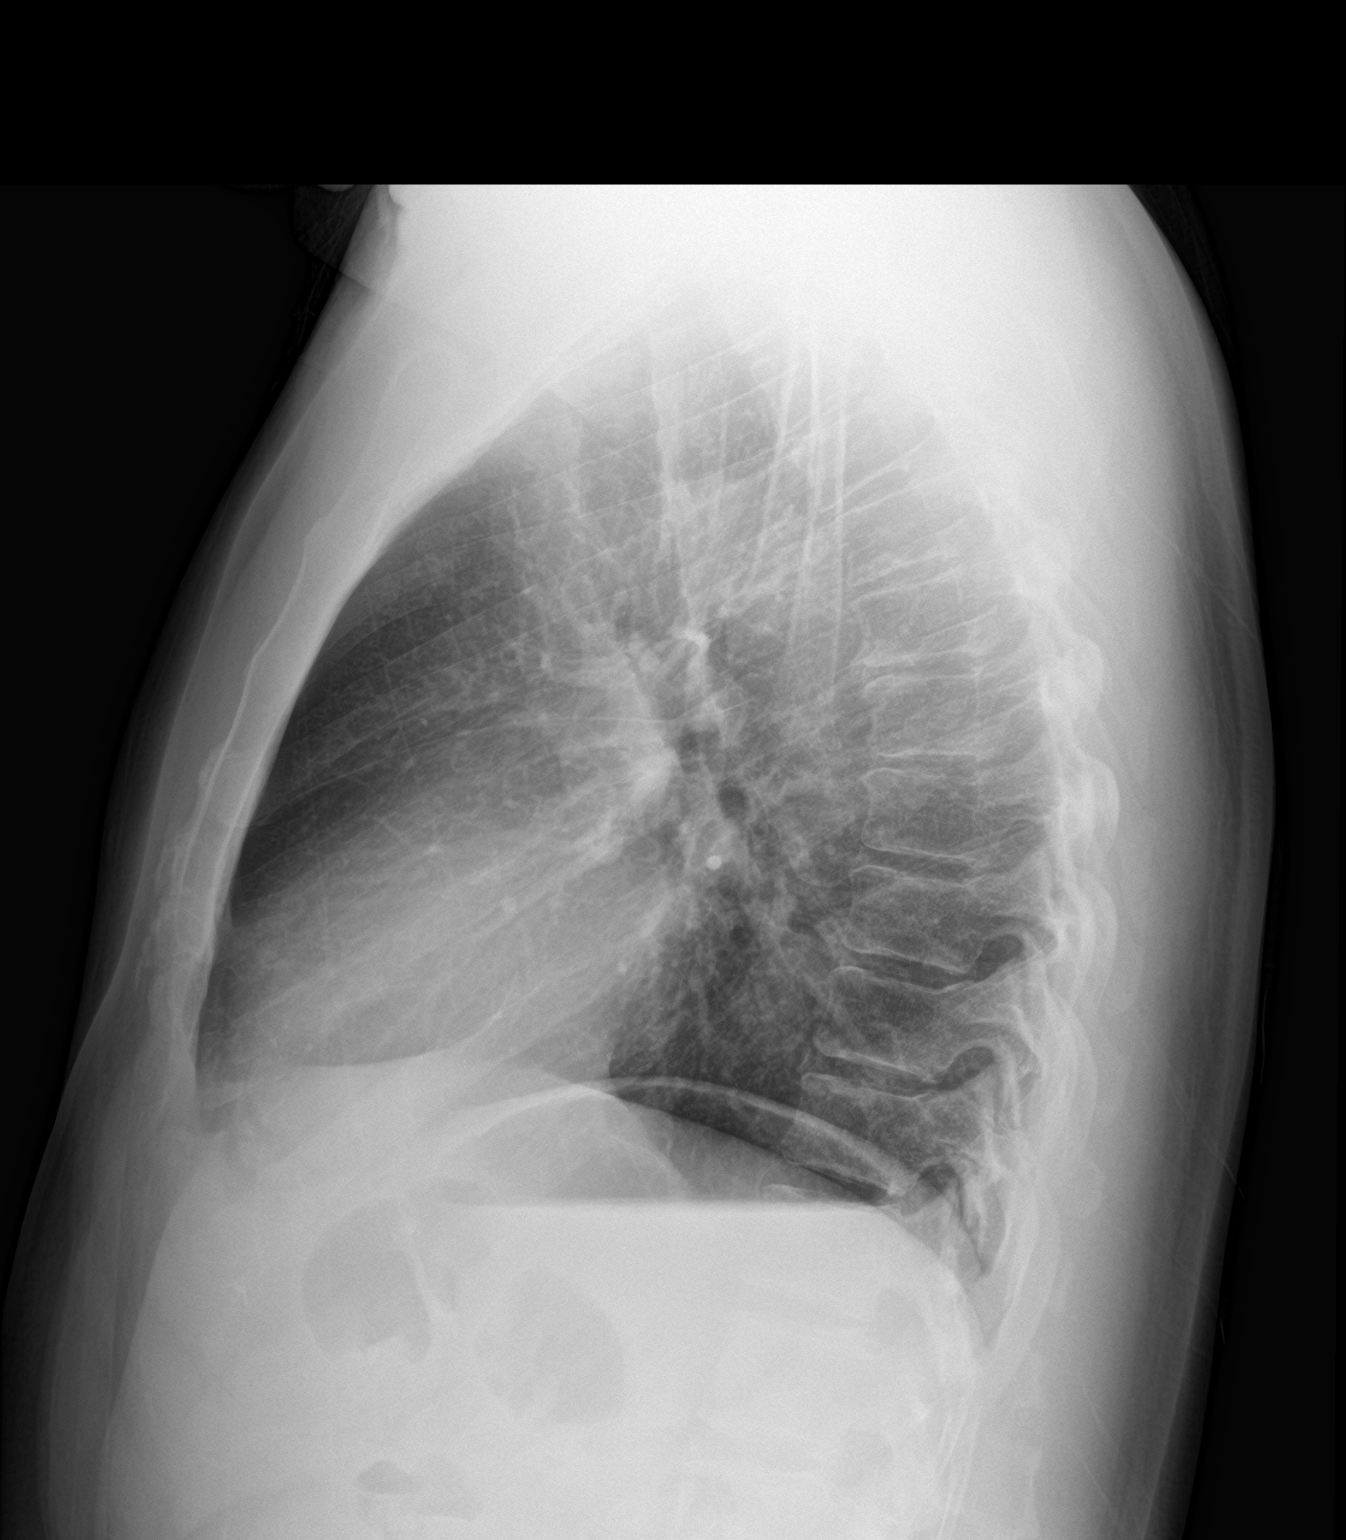

[2 of 2 positions shown; findings below may reference images not displayed]

FINDINGS: Cardiomediastinal silhouette unchanged in size and contour. No
evidence of central vascular congestion. No pneumothorax or pleural
effusion. No confluent airspace disease. Coarsened interstitial
markings are similar to prior. No acute displaced fracture
IMPRESSION: Her negative for acute cardiopulmonary disease

## 2018-08-31 ENCOUNTER — Encounter (HOSPITAL_COMMUNITY): Payer: Self-pay | Admitting: *Deleted

## 2018-08-31 ENCOUNTER — Ambulatory Visit (HOSPITAL_COMMUNITY)
Admission: RE | Admit: 2018-08-31 | Discharge: 2018-08-31 | Disposition: A | Discharge status: Home / Self Care | Payer: Medicaid Other | Source: Ambulatory Visit | Attending: Cardiovascular Disease | Admitting: Cardiovascular Disease

## 2018-08-31 DIAGNOSIS — R072 Precordial pain: Secondary | ICD-10-CM

## 2018-08-31 NOTE — Progress Notes (Unsigned)
Abnormal ETT was reviewed by Dr. Stanford Breed. Patient was given the ok to be discharged to go home.

## 2018-09-01 DIAGNOSIS — M7661 Achilles tendinitis, right leg: Secondary | ICD-10-CM | POA: Diagnosis not present

## 2018-09-01 DIAGNOSIS — M7662 Achilles tendinitis, left leg: Secondary | ICD-10-CM | POA: Diagnosis not present

## 2018-09-01 DIAGNOSIS — M79671 Pain in right foot: Secondary | ICD-10-CM | POA: Diagnosis not present

## 2018-09-01 DIAGNOSIS — M79672 Pain in left foot: Secondary | ICD-10-CM | POA: Diagnosis not present

## 2018-09-01 LAB — EXERCISE TOLERANCE TEST
CHL CUP RESTING HR STRESS: 58 {beats}/min
CHL RATE OF PERCEIVED EXERTION: 19
CSEPEW: 9.3 METS
Exercise duration (min): 7 min
Exercise duration (sec): 30 s
MPHR: 165 {beats}/min
Peak HR: 171 {beats}/min
Percent HR: 103 %

## 2018-09-03 ENCOUNTER — Telehealth: Payer: Self-pay | Admitting: Cardiology

## 2018-09-03 DIAGNOSIS — R931 Abnormal findings on diagnostic imaging of heart and coronary circulation: Secondary | ICD-10-CM

## 2018-09-03 DIAGNOSIS — R9439 Abnormal result of other cardiovascular function study: Secondary | ICD-10-CM

## 2018-09-03 DIAGNOSIS — R079 Chest pain, unspecified: Secondary | ICD-10-CM

## 2018-09-03 NOTE — Telephone Encounter (Signed)
Returned call to patient no answer.LMTC. 

## 2018-09-03 NOTE — Telephone Encounter (Signed)
New Message   Pt is returning call for nurse about results

## 2018-09-06 NOTE — Telephone Encounter (Signed)
Notes recorded by Lelon Perla, MD on 09/01/2018 at 7:40 AM EDT ETT with ST changes; arrange stress nuclear study [place]

## 2018-09-08 NOTE — Telephone Encounter (Signed)
Follow up  ° ° °Patient is returning call.  °

## 2018-09-08 NOTE — Telephone Encounter (Signed)
LM2CB 

## 2018-09-08 NOTE — Telephone Encounter (Signed)
Pt notified  Message sent to scheduling and precert

## 2018-09-15 ENCOUNTER — Telehealth (HOSPITAL_COMMUNITY): Payer: Self-pay

## 2018-09-15 DIAGNOSIS — M79671 Pain in right foot: Secondary | ICD-10-CM | POA: Diagnosis not present

## 2018-09-15 DIAGNOSIS — M7661 Achilles tendinitis, right leg: Secondary | ICD-10-CM | POA: Diagnosis not present

## 2018-09-15 DIAGNOSIS — M7662 Achilles tendinitis, left leg: Secondary | ICD-10-CM | POA: Diagnosis not present

## 2018-09-15 DIAGNOSIS — M79672 Pain in left foot: Secondary | ICD-10-CM | POA: Diagnosis not present

## 2018-09-15 NOTE — Telephone Encounter (Signed)
Encounter complete. 

## 2018-09-16 ENCOUNTER — Telehealth (HOSPITAL_COMMUNITY): Payer: Self-pay

## 2018-09-16 NOTE — Telephone Encounter (Signed)
Encounter complete. 

## 2018-09-17 ENCOUNTER — Ambulatory Visit (HOSPITAL_COMMUNITY)
Admission: RE | Admit: 2018-09-17 | Discharge: 2018-09-17 | Disposition: A | Discharge status: Home / Self Care | Payer: Medicaid Other | Source: Ambulatory Visit | Attending: Cardiology | Admitting: Cardiology

## 2018-09-17 DIAGNOSIS — R079 Chest pain, unspecified: Secondary | ICD-10-CM | POA: Insufficient documentation

## 2018-09-17 DIAGNOSIS — R931 Abnormal findings on diagnostic imaging of heart and coronary circulation: Secondary | ICD-10-CM | POA: Insufficient documentation

## 2018-09-17 DIAGNOSIS — R9439 Abnormal result of other cardiovascular function study: Secondary | ICD-10-CM | POA: Diagnosis not present

## 2018-09-17 LAB — MYOCARDIAL PERFUSION IMAGING
CHL CUP MPHR: 165 {beats}/min
CHL CUP NUCLEAR SRS: 1
CHL CUP RESTING HR STRESS: 42 {beats}/min
CSEPED: 10 min
CSEPEDS: 0 s
Estimated workload: 11.7 METS
LV dias vol: 110 mL (ref 62–150)
LV sys vol: 56 mL
Peak HR: 190 {beats}/min
Percent HR: 115 %
RPE: 19
SDS: 0
SSS: 1
TID: 1.06

## 2018-09-17 MED ORDER — TECHNETIUM TC 99M TETROFOSMIN IV KIT
10.3000 | PACK | Freq: Once | INTRAVENOUS | Status: AC | PRN
  Administered 2018-09-17: 10.3 via INTRAVENOUS
  Filled 2018-09-17: qty 11

## 2018-09-17 MED ORDER — TECHNETIUM TC 99M TETROFOSMIN IV KIT
31.6000 | PACK | Freq: Once | INTRAVENOUS | Status: AC | PRN
  Administered 2018-09-17: 31.6 via INTRAVENOUS
  Filled 2018-09-17: qty 32

## 2018-09-17 MED ORDER — REGADENOSON 0.4 MG/5ML IV SOLN: 0.4000 mg | Freq: Once | INTRAVENOUS | Status: DC

## 2018-09-20 ENCOUNTER — Encounter: Payer: Self-pay | Admitting: Cardiology

## 2018-09-20 NOTE — Telephone Encounter (Signed)
This encounter was created in error - please disregard.

## 2018-09-20 NOTE — Telephone Encounter (Signed)
Follow Up:      Returning your call from this morning, concerning his Stress Test results.

## 2018-09-28 ENCOUNTER — Other Ambulatory Visit: Payer: Self-pay

## 2018-09-28 ENCOUNTER — Encounter (INDEPENDENT_AMBULATORY_CARE_PROVIDER_SITE_OTHER): Payer: Self-pay | Admitting: Physician Assistant

## 2018-09-28 ENCOUNTER — Ambulatory Visit (INDEPENDENT_AMBULATORY_CARE_PROVIDER_SITE_OTHER): Payer: Medicaid Other | Admitting: Physician Assistant

## 2018-09-28 VITALS — BP 115/77 | HR 53 | Temp 97.6°F | Ht 69.0 in | Wt 183.0 lb

## 2018-09-28 DIAGNOSIS — R682 Dry mouth, unspecified: Secondary | ICD-10-CM

## 2018-09-28 DIAGNOSIS — R42 Dizziness and giddiness: Secondary | ICD-10-CM

## 2018-09-28 DIAGNOSIS — R0981 Nasal congestion: Secondary | ICD-10-CM | POA: Diagnosis not present

## 2018-09-28 MED ORDER — AZITHROMYCIN 250 MG PO TABS: ORAL_TABLET | ORAL | 0 refills | Status: DC

## 2018-09-28 MED ORDER — GUAIFENESIN ER 1200 MG PO TB12: 1.0000 | ORAL_TABLET | Freq: Two times a day (BID) | ORAL | 0 refills | Status: AC

## 2018-09-28 MED ORDER — PHENYLEPHRINE HCL 0.25 % NA SOLN: 1.0000 | Freq: Four times a day (QID) | NASAL | 0 refills | Status: DC | PRN

## 2018-09-28 NOTE — Patient Instructions (Signed)
Upper Respiratory Infection, Adult Most upper respiratory infections (URIs) are caused by a virus. A URI affects the nose, throat, and upper air passages. The most common type of URI is often called "the common cold." Follow these instructions at home:  Take medicines only as told by your doctor.  Gargle warm saltwater or take cough drops to comfort your throat as told by your doctor.  Use a warm mist humidifier or inhale steam from a shower to increase air moisture. This may make it easier to breathe.  Drink enough fluid to keep your pee (urine) clear or pale yellow.  Eat soups and other clear broths.  Have a healthy diet.  Rest as needed.  Go back to work when your fever is gone or your doctor says it is okay. ? You may need to stay home longer to avoid giving your URI to others. ? You can also wear a face mask and wash your hands often to prevent spread of the virus.  Use your inhaler more if you have asthma.  Do not use any tobacco products, including cigarettes, chewing tobacco, or electronic cigarettes. If you need help quitting, ask your doctor. Contact a doctor if:  You are getting worse, not better.  Your symptoms are not helped by medicine.  You have chills.  You are getting more short of breath.  You have brown or red mucus.  You have yellow or brown discharge from your nose.  You have pain in your face, especially when you bend forward.  You have a fever.  You have puffy (swollen) neck glands.  You have pain while swallowing.  You have white areas in the back of your throat. Get help right away if:  You have very bad or constant: ? Headache. ? Ear pain. ? Pain in your forehead, behind your eyes, and over your cheekbones (sinus pain). ? Chest pain.  You have long-lasting (chronic) lung disease and any of the following: ? Wheezing. ? Long-lasting cough. ? Coughing up blood. ? A change in your usual mucus.  You have a stiff neck.  You have  changes in your: ? Vision. ? Hearing. ? Thinking. ? Mood. This information is not intended to replace advice given to you by your health care provider. Make sure you discuss any questions you have with your health care provider. Document Released: 05/26/2008 Document Revised: 08/10/2016 Document Reviewed: 03/15/2014 Elsevier Interactive Patient Education  2018 Elsevier Inc.  

## 2018-09-28 NOTE — Progress Notes (Signed)
Subjective:  Patient ID: Garrett Clark, male    DOB: 10-01-1961  Age: 57 y.o. MRN: [id number]  CC: dizzines  HPI Garrett Clark is [id number] y.o. male with a medical historyof HLD,CKD,internal hemorrhoids, cocaine use,and LBP presents with two week history of dizziness, "swooshing" in the ears, occasional cough, and mild right sided sore throat. Also has chronically dry mouth. Says he does not like to drink water. No close contacts with the same symptoms. Has not taken anything for relief. Does not endorse loss of audition, tinnitus, headache, CP, palpitations, SOB, abdominal pain, fever, chills, nausea, vomiting, rash, swelling, or GI/GU sxs.      Outpatient Medications Prior to Visit  Medication Sig Dispense Refill  . atorvastatin (LIPITOR) 40 MG tablet Take 1 tablet (40 mg total) by mouth daily. 90 tablet 3  . cyclobenzaprine (FLEXERIL) 5 MG tablet Take 1 tablet (5 mg total) by mouth at bedtime as needed for muscle spasms. 30 tablet 5  . gabapentin (NEURONTIN) 300 MG capsule Take 1 capsule (300 mg total) by mouth 3 (three) times daily. (Patient taking differently: Take 300 mg by mouth 2 (two) times daily. ) 90 capsule 3  . BUTRANS 10 MCG/HR PTWK patch Place 10 mcg onto the skin once a week.  0  . acetaminophen (TYLENOL) 500 MG tablet Take 500 mg by mouth every 6 (six) hours as needed.    . hydrOXYzine (ATARAX/VISTARIL) 25 MG tablet Take 1 tablet (25 mg total) by mouth 2 (two) times daily as needed. 30 tablet 0   No facility-administered medications prior to visit.      ROS Review of Systems  Constitutional: Negative for chills, fever and malaise/fatigue.  HENT: Positive for congestion.   Eyes: Negative for blurred vision.  Respiratory: Negative for shortness of breath.   Cardiovascular: Negative for chest pain and palpitations.  Gastrointestinal: Negative for abdominal pain and nausea.  Genitourinary: Negative for dysuria and hematuria.  Musculoskeletal: Negative for  joint pain and myalgias.  Skin: Negative for rash.  Neurological: Negative for tingling and headaches.  Psychiatric/Behavioral: Negative for depression. The patient is not nervous/anxious.     Objective:  BP 115/77 (BP Location: Right Arm, Patient Position: Sitting, Cuff Size: Normal)   Pulse (!) 53   Temp 97.6 F (36.4 C) (Oral)   Ht 5\' 9"  (1.753 m)   Wt 183 lb (83 kg)   SpO2 99%   BMI 27.02 kg/m   BP/Weight 09/28/2018 4/33/2951 07/29/4165  Systolic BP 063 - 016  Diastolic BP 77 - 79  Wt. (Lbs) 183 179 179.8  BMI 27.02 26.43 26.55      Physical Exam  Constitutional: He is oriented to person, place, and time.  Well developed, well nourished, NAD, polite, occasional mild cough  HENT:  Head: Normocephalic and atraumatic.  Turbinates moderate to severely hypertrophic bilaterally. TMs mildly erythematous with congestion and mild bulging.   Eyes: No scleral icterus.  Neck: Normal range of motion. Neck supple. No thyromegaly present.  Cardiovascular: Normal rate, regular rhythm and normal heart sounds.  Pulmonary/Chest: Effort normal and breath sounds normal.  Musculoskeletal: He exhibits no edema.  Lymphadenopathy:    He has no cervical adenopathy.  Neurological: He is alert and oriented to person, place, and time.  Skin: Skin is warm and dry. No rash noted. No erythema. No pallor.  Psychiatric: He has a normal mood and affect. His behavior is normal. Thought content normal.  Vitals reviewed.    Assessment & Plan:   1.  Dizziness - Comprehensive metabolic panel - phenylephrine (NEO-SYNEPHRINE) 0.25 % nasal spray; Place 1 spray into both nostrils every 6 (six) hours as needed for congestion.  Dispense: 15 mL; Refill: 0 - Guaifenesin (MUCINEX MAXIMUM STRENGTH) 1200 MG TB12; Take 1 tablet (1,200 mg total) by mouth 2 (two) times daily for 5 days.  Dispense: 10 tablet; Refill: 0 - azithromycin (ZITHROMAX) 250 MG tablet; Take two tablets on day one. Then one tablet daily  thereafter.  Dispense: 6 tablet; Refill: 0  2. Nasal congestion - phenylephrine (NEO-SYNEPHRINE) 0.25 % nasal spray; Place 1 spray into both nostrils every 6 (six) hours as needed for congestion.  Dispense: 15 mL; Refill: 0 - Guaifenesin (MUCINEX MAXIMUM STRENGTH) 1200 MG TB12; Take 1 tablet (1,200 mg total) by mouth 2 (two) times daily for 5 days.  Dispense: 10 tablet; Refill: 0 - azithromycin (ZITHROMAX) 250 MG tablet; Take two tablets on day one. Then one tablet daily thereafter.  Dispense: 6 tablet; Refill: 0  3. Dry mouth - ANA w/Reflex - Comprehensive metabolic panel   Meds ordered this encounter  Medications  . phenylephrine (NEO-SYNEPHRINE) 0.25 % nasal spray    Sig: Place 1 spray into both nostrils every 6 (six) hours as needed for congestion.    Dispense:  15 mL    Refill:  0    Order Specific Question:   Supervising Provider    Answer:   Charlott Rakes [[id number]]  . Guaifenesin (MUCINEX MAXIMUM STRENGTH) 1200 MG TB12    Sig: Take 1 tablet (1,200 mg total) by mouth 2 (two) times daily for 5 days.    Dispense:  10 tablet    Refill:  0    Order Specific Question:   Supervising Provider    Answer:   Charlott Rakes [[id number]]  . azithromycin (ZITHROMAX) 250 MG tablet    Sig: Take two tablets on day one. Then one tablet daily thereafter.    Dispense:  6 tablet    Refill:  0    Order Specific Question:   Supervising Provider    Answer:   Charlott Rakes [[id number]]    Follow-up: Return if symptoms worsen or fail to improve.   Clent Demark PA

## 2018-09-29 ENCOUNTER — Other Ambulatory Visit (INDEPENDENT_AMBULATORY_CARE_PROVIDER_SITE_OTHER): Payer: Self-pay | Admitting: Physician Assistant

## 2018-09-29 DIAGNOSIS — M79601 Pain in right arm: Secondary | ICD-10-CM

## 2018-09-29 DIAGNOSIS — M79602 Pain in left arm: Principal | ICD-10-CM

## 2018-09-29 DIAGNOSIS — R202 Paresthesia of skin: Principal | ICD-10-CM

## 2018-09-29 LAB — COMPREHENSIVE METABOLIC PANEL
A/G RATIO: 1.3 (ref 1.2–2.2)
ALBUMIN: 4.2 g/dL (ref 3.5–5.5)
ALK PHOS: 90 IU/L (ref 39–117)
ALT: 18 IU/L (ref 0–44)
AST: 28 IU/L (ref 0–40)
BUN/Creatinine Ratio: 11 (ref 9–20)
BUN: 19 mg/dL (ref 6–24)
Bilirubin Total: 0.9 mg/dL (ref 0.0–1.2)
CO2: 21 mmol/L (ref 20–29)
CREATININE: 1.68 mg/dL — AB (ref 0.76–1.27)
Calcium: 9.2 mg/dL (ref 8.7–10.2)
Chloride: 103 mmol/L (ref 96–106)
GFR calc Af Amer: 52 mL/min/{1.73_m2} — ABNORMAL LOW (ref >59)
GFR calc non Af Amer: 45 mL/min/{1.73_m2} — ABNORMAL LOW (ref >59)
GLOBULIN, TOTAL: 3.3 g/dL (ref 1.5–4.5)
Glucose: 98 mg/dL (ref 65–99)
POTASSIUM: 4.3 mmol/L (ref 3.5–5.2)
SODIUM: 142 mmol/L (ref 134–144)
Total Protein: 7.5 g/dL (ref 6.0–8.5)

## 2018-09-29 LAB — ANA W/REFLEX: Anti Nuclear Antibody(ANA): NEGATIVE

## 2018-09-29 MED ORDER — GABAPENTIN 300 MG PO CAPS: 300.0000 mg | ORAL_CAPSULE | Freq: Two times a day (BID) | ORAL | 5 refills | Status: DC

## 2018-09-30 ENCOUNTER — Telehealth (INDEPENDENT_AMBULATORY_CARE_PROVIDER_SITE_OTHER): Payer: Self-pay

## 2018-09-30 NOTE — Telephone Encounter (Signed)
-----   Message from Clent Demark, PA-C sent at 09/29/2018  5:21 PM EDT ----- No autoimmune disease causing dry mouth. CKD is stable. Please remind him to only take up to two tablets of Gabapentin per day. Thank you.

## 2018-09-30 NOTE — Telephone Encounter (Signed)
Left voicemail notifying patient that lab test determine that there is no autoimmune disease causing his dry mouth. CKD stable. Reminded patient to only take up to two tablets of gabapentin per day. Call [facility] with any questions or concerns. [name], CMA

## 2018-12-06 ENCOUNTER — Other Ambulatory Visit (INDEPENDENT_AMBULATORY_CARE_PROVIDER_SITE_OTHER): Payer: Self-pay | Admitting: Physician Assistant

## 2018-12-06 DIAGNOSIS — E7841 Elevated Lipoprotein(a): Secondary | ICD-10-CM

## 2018-12-07 NOTE — Telephone Encounter (Signed)
FWD to PCP. Taniya Dasher S Adysen Raphael, CMA  

## 2019-01-03 ENCOUNTER — Encounter (HOSPITAL_COMMUNITY): Payer: Self-pay | Admitting: Emergency Medicine

## 2019-01-03 ENCOUNTER — Other Ambulatory Visit: Payer: Self-pay

## 2019-01-03 ENCOUNTER — Ambulatory Visit (HOSPITAL_COMMUNITY)
Admission: EM | Admit: 2019-01-03 | Discharge: 2019-01-03 | Disposition: A | Discharge status: Home / Self Care | Payer: Medicaid Other | Attending: Urgent Care | Admitting: Urgent Care

## 2019-01-03 DIAGNOSIS — Q600 Renal agenesis, unilateral: Secondary | ICD-10-CM | POA: Insufficient documentation

## 2019-01-03 DIAGNOSIS — IMO0002 Reserved for concepts with insufficient information to code with codable children: Secondary | ICD-10-CM

## 2019-01-03 DIAGNOSIS — R030 Elevated blood-pressure reading, without diagnosis of hypertension: Secondary | ICD-10-CM

## 2019-01-03 DIAGNOSIS — Z85528 Personal history of other malignant neoplasm of kidney: Secondary | ICD-10-CM

## 2019-01-03 DIAGNOSIS — R0789 Other chest pain: Secondary | ICD-10-CM | POA: Diagnosis not present

## 2019-01-03 MED ORDER — CYCLOBENZAPRINE HCL 5 MG PO TABS: 5.0000 mg | ORAL_TABLET | Freq: Every evening | ORAL | 5 refills | Status: DC | PRN

## 2019-01-03 MED ORDER — OMEPRAZOLE 20 MG PO CPDR: 20.0000 mg | DELAYED_RELEASE_CAPSULE | Freq: Two times a day (BID) | ORAL | 0 refills | Status: DC

## 2019-01-03 NOTE — ED Provider Notes (Signed)
MRN: [id number] DOB: 1961/11/19  Subjective:   Garrett Clark is a 58 y.o. male presenting for 3 week history of constant, worsening aching chest pain worse with eating and drinking fluids. Has not tried medications for relief. Patient has a cardiologist and does not have a heart problem. Has an appt for a recheck on 01/13/2018. Takes gabapentin, atorvastatin. Has a history of renal carcinoma, has 1 remaining kidney on right side. Patient works at [employer], eats a lot of chocolate. Denies alcohol use, smoking cigarettes. Does use marijuana.   No current facility-administered medications for this encounter.   Current Outpatient Medications:  .  atorvastatin (LIPITOR) 40 MG tablet, TAKE 1 TABLET BY MOUTH EVERY DAY, Disp: 90 tablet, Rfl: 3 .  gabapentin (NEURONTIN) 300 MG capsule, Take 1 capsule (300 mg total) by mouth 2 (two) times daily., Disp: 60 capsule, Rfl: 5 .  azithromycin (ZITHROMAX) 250 MG tablet, Take two tablets on day one. Then one tablet daily thereafter., Disp: 6 tablet, Rfl: 0 .  cyclobenzaprine (FLEXERIL) 5 MG tablet, Take 1 tablet (5 mg total) by mouth at bedtime as needed for muscle spasms., Disp: 30 tablet, Rfl: 5 .  phenylephrine (NEO-SYNEPHRINE) 0.25 % nasal spray, Place 1 spray into both nostrils every 6 (six) hours as needed for congestion., Disp: 15 mL, Rfl: 0   No Known Allergies  Past Medical History:  Diagnosis Date  . Cancer ([place])    Left kidney  . Hyperlipidemia   . Internal hemorrhoids   . Polycystic liver disease   . Substance abuse [facility])      Past Surgical History:  Procedure Laterality Date  . COLONOSCOPY    . HEMORRHOID BANDING    . ROBOT ASSISTED LAPAROSCOPIC NEPHRECTOMY Left 12/07/2017   Procedure: XI ROBOTIC ASSISTED LAPAROSCOPIC NEPHRECTOMY;  Surgeon: Cleon Gustin, MD;  Location: [facility];  Service: Urology;  Laterality: Left;    Review of Systems  Constitutional: Negative for chills, fever and malaise/fatigue.  HENT: Negative for  congestion and sore throat.   Eyes: Negative for blurred vision and double vision.  Respiratory: Negative for cough.   Cardiovascular: Negative for palpitations.  Gastrointestinal: Negative for abdominal pain, nausea and vomiting.  Genitourinary: Negative for dysuria and hematuria.  Musculoskeletal: Negative for myalgias.  Skin: Negative for rash.  Neurological: Positive for headaches (mild, intermittent). Negative for dizziness.  Psychiatric/Behavioral: Negative for depression and substance abuse.   Objective:   Vitals: BP (!) 154/79 (BP Location: Left Arm)   Pulse (!) 56   Temp 98.3 F (36.8 C) (Oral)   Resp 18   SpO2 99%   Physical Exam Constitutional:      General: He is not in acute distress.    Appearance: Normal appearance. He is well-developed. He is not ill-appearing, toxic-appearing or diaphoretic.  HENT:     Head: Normocephalic and atraumatic.     Right Ear: External ear normal.     Left Ear: External ear normal.     Nose: Nose normal.     Mouth/Throat:     Mouth: Mucous membranes are moist.     Pharynx: Oropharynx is clear.  Eyes:     General: No scleral icterus.    Extraocular Movements: Extraocular movements intact.     Pupils: Pupils are equal, round, and reactive to light.  Neck:     Musculoskeletal: Normal range of motion and neck supple.  Cardiovascular:     Rate and Rhythm: Normal rate and regular rhythm.     Heart sounds: Normal  heart sounds. No murmur. No friction rub. No gallop.   Pulmonary:     Effort: Pulmonary effort is normal. No respiratory distress.     Breath sounds: Normal breath sounds. No stridor. No wheezing, rhonchi or rales.  Abdominal:     General: Bowel sounds are normal. There is no distension.     Palpations: Abdomen is soft. There is no mass.     Tenderness: There is abdominal tenderness (mild over epigastric area). There is no right CVA tenderness, left CVA tenderness, guarding or rebound.     Hernia: No hernia is present.   Skin:    General: Skin is warm and dry.  Neurological:     Mental Status: He is alert and oriented to person, place, and time.  Psychiatric:        Mood and Affect: Mood normal.        Behavior: Behavior normal.        Thought Content: Thought content normal.    ED ECG REPORT   Date: 01/03/2019  Rate: 48  Rhythm: sinus bradycardia  QRS Axis: normal  Intervals: normal  ST/T Wave abnormalities: normal and nonspecific T wave changes  Conduction Disutrbances:none  Narrative Interpretation: sinus bradycardia, t-wave inversion at V1 (not new), t-wave flattening at III, very comparable to previous ecg from 09/2018.  Old EKG Reviewed: changes noted  I have personally reviewed the EKG tracing and agree with the computerized printout as noted.  Assessment and Plan :   Atypical chest pain  Elevated blood pressure reading  Solitary right kidney  History of renal carcinoma  I do not suspect cardiac source for his atypical chest pain.  However, I mentioned to patient that they could more definitively rule this out in the ER.  From the urgent care site, I counseled that we could use omeprazole to address acid reflux together with dietary modifications which she admits to eating GERD causing foods.  He is agreeable to contact his cardiologist today even though he has an appointment on 23 of this month.  Strict ER precautions given to patient.  I refilled his Flexeril to address musculoskeletal source of his atypical chest pain.  He adamantly refuses that he is ever had high blood pressure, I believe this may be related to his pain but gave patient monitoring parameters. Counseled patient on potential for adverse effects with medications prescribed today, patient verbalized understanding. Return-to-clinic precautions discussed, patient verbalized understanding.    Jaynee Eagles, PA-C 01/03/19 1303

## 2019-01-03 NOTE — Discharge Instructions (Signed)
You may take 500mg Tylenol every 6 hours for pain and inflammation. ° °

## 2019-01-03 NOTE — ED Triage Notes (Signed)
Upper, center chest discomfort, feels like something stuck there.  Pain for 3 weeks, constant.  Patient states eating/swallowing makes pain worse.  Denies nausea, no vomiting.

## 2019-01-06 ENCOUNTER — Ambulatory Visit (INDEPENDENT_AMBULATORY_CARE_PROVIDER_SITE_OTHER): Payer: Medicaid Other | Admitting: Critical Care Medicine

## 2019-01-10 ENCOUNTER — Other Ambulatory Visit (HOSPITAL_COMMUNITY): Payer: Self-pay | Admitting: Urology

## 2019-01-10 ENCOUNTER — Ambulatory Visit (HOSPITAL_COMMUNITY)
Admission: RE | Admit: 2019-01-10 | Discharge: 2019-01-10 | Disposition: A | Discharge status: Home / Self Care | Payer: Medicaid Other | Source: Ambulatory Visit | Attending: Urology | Admitting: Urology

## 2019-01-10 DIAGNOSIS — C642 Malignant neoplasm of left kidney, except renal pelvis: Secondary | ICD-10-CM

## 2019-01-10 DIAGNOSIS — R079 Chest pain, unspecified: Secondary | ICD-10-CM | POA: Diagnosis not present

## 2019-01-12 ENCOUNTER — Ambulatory Visit: Payer: Medicaid Other | Admitting: Urology

## 2019-01-12 DIAGNOSIS — C642 Malignant neoplasm of left kidney, except renal pelvis: Secondary | ICD-10-CM

## 2019-01-12 DIAGNOSIS — M5489 Other dorsalgia: Secondary | ICD-10-CM

## 2019-01-13 ENCOUNTER — Ambulatory Visit (INDEPENDENT_AMBULATORY_CARE_PROVIDER_SITE_OTHER): Payer: Medicaid Other | Admitting: Critical Care Medicine

## 2019-01-13 ENCOUNTER — Encounter (INDEPENDENT_AMBULATORY_CARE_PROVIDER_SITE_OTHER): Payer: Self-pay | Admitting: Critical Care Medicine

## 2019-01-13 ENCOUNTER — Other Ambulatory Visit: Payer: Self-pay

## 2019-01-13 VITALS — BP 118/72 | HR 72 | Temp 97.9°F | Ht 69.0 in | Wt 188.6 lb

## 2019-01-13 DIAGNOSIS — E7800 Pure hypercholesterolemia, unspecified: Secondary | ICD-10-CM

## 2019-01-13 DIAGNOSIS — Z905 Acquired absence of kidney: Secondary | ICD-10-CM | POA: Diagnosis not present

## 2019-01-13 DIAGNOSIS — C642 Malignant neoplasm of left kidney, except renal pelvis: Secondary | ICD-10-CM

## 2019-01-13 DIAGNOSIS — K219 Gastro-esophageal reflux disease without esophagitis: Secondary | ICD-10-CM | POA: Diagnosis not present

## 2019-01-13 DIAGNOSIS — F1411 Cocaine abuse, in remission: Secondary | ICD-10-CM | POA: Diagnosis not present

## 2019-01-13 NOTE — Assessment & Plan Note (Signed)
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

## 2019-01-13 NOTE — Assessment & Plan Note (Signed)
Gastroesophageal reflux disease with evidence for esophageal spasm  We will continue omeprazole twice daily

## 2019-01-13 NOTE — Assessment & Plan Note (Signed)
Status post left nephrectomy without evidence of kidney cancer recurrence

## 2019-01-13 NOTE — Progress Notes (Signed)
Subjective:    Patient ID: Garrett Clark, male    DOB: [birth year], 58 y.o.   MRN: [id number]  Garrett Clark is a  58 y.o. male with a medical history of HLD, CKD, internal  hemorrhoids, prior cocaine use, and LBP   The patient was seen in the emergency room on January 14 for chest discomfort and MI was ruled out.  The patient has subsequently been seen by cardiology who performed an exercise stress Myoview which was negative for ischemia.  The patient's echocardiogram also recently obtained showed ejection fraction 60% and was normal  The patient maintains gabapentin and atorvastatin.  The patient was last seen in [facility] in October 2019.  Patient has a previous history of renal cell cancer and had a nephrectomy for same.  Patient a follow-up chest x-ray and neurology evaluation just this week which did not reveal recurrence of malignancy.  Chest x-ray was clear.  The patient states that he is no longer using cocaine he does use marijuana  No tobacco use is noted  The patient went to urgent care because of chest pain syndrome recently and with that was treated with Prilosec and Flexeril which has helped his symptoms.  He states it feels as though his esophagus is spasming.  There is heartburn as well.  Since being on the omeprazole the patient is improved.    Note the lipids have not been checked in 2 years.  The patient also complains of left groin pain       Past Medical History:  Diagnosis Date  . Cancer ([place])    Left kidney  . Hyperlipidemia   . Internal hemorrhoids   . Polycystic liver disease   . Substance abuse ([place])      Family History  Problem Relation Age of Onset  . Diabetes Mother   . Hypertension Mother   . Alcohol abuse Mother   . Diabetes Father   . Kidney disease Father   . Diabetes Sister   . Stroke Sister   . Diabetes Paternal Grandmother   . Cancer Paternal Grandfather        type unknown  . Colon cancer Neg Hx      Social  History   Socioeconomic History  . Marital status: Single    Spouse name: Not on file  . Number of children: 2  . Years of education: 79  . Highest education level: Not on file  Occupational History    Employer: [employer]  . Financial resource strain: Not on file  . Food insecurity:    Worry: Not on file    Inability: Not on file  . Transportation needs:    Medical: Not on file    Non-medical: Not on file  Tobacco Use  . Smoking status: Former Smoker    Types: Cigarettes    Last attempt to quit: 12/03/2012    Years since quitting: 6.1  . Smokeless tobacco: Never Used  Substance and Sexual Activity  . Alcohol use: Yes    Comment: Occasional  . 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 Sexual activity: Not on file  Lifestyle  . Physical activity:    Days per week: Not on file    Minutes per session: Not on file  . Stress: Not  on file  Relationships  . Social connections:    Talks on phone: Not on file    Gets together: Not on file    Attends religious service: Not on file    Active member of club or organization: Not on file    Attends meetings of clubs or organizations: Not on file    Relationship status: Not on file  . Intimate partner violence:    Fear of current or ex partner: Not on file    Emotionally abused: Not on file    Physically abused: Not on file    Forced sexual activity: Not on file  Other Topics Concern  . Not on file  Social History Narrative   Lives with girlfriend and 64 year old daughter.  Has 2 children.  Works at [employer].  Education: high school.      No Known Allergies   Outpatient Medications Prior to Visit  Medication Sig Dispense Refill  . atorvastatin (LIPITOR) 40 MG tablet TAKE 1 TABLET BY MOUTH EVERY DAY 90 tablet 3  . cyclobenzaprine (FLEXERIL) 5 MG tablet Take 1 tablet (5 mg total) by mouth at  bedtime as needed for muscle spasms. 30 tablet 5  . gabapentin (NEURONTIN) 300 MG capsule Take 1 capsule (300 mg total) by mouth 2 (two) times daily. 60 capsule 5  . omeprazole (PRILOSEC) 20 MG capsule Take 1 capsule (20 mg total) by mouth 2 (two) times daily before a meal. 60 capsule 0   No facility-administered medications prior to visit.     Review of Systems     Objective:   Physical Exam  BP 118/72 (BP Location: Right Arm, Patient Position: Sitting, Cuff Size: Normal)   Pulse 72   Temp 97.9 F (36.6 C) (Oral)   Ht 5\' 9"  (1.753 m)   Wt 188 lb 9.6 oz (85.5 kg)   SpO2 96%   BMI 27.85 kg/m   Gen: Pleasant, well-nourished, in no distress,  normal affect  ENT: No lesions,  mouth clear,  oropharynx clear, no postnasal drip  Neck: No JVD, no TMG, no carotid bruits  Lungs: No use of accessory muscles, no dullness to percussion, clear without rales or rhonchi  Cardiovascular: RRR, heart sounds normal, no murmur or gallops, no peripheral edema  Abdomen: soft and NT, no HSM,  BS normal, there is no hernia in either the left or right inguinal areas Testicles are normal    Musculoskeletal: No deformities, no cyanosis or clubbing  Neuro: alert, non focal  Skin: Warm, no lesions or rashes   1/20 CXR Clear    Assessment & Plan:  I personally reviewed all images and lab data in the [facility] system as well as any outside material available during this office visit and agree with the  radiology impressions.   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

## 2019-01-13 NOTE — Patient Instructions (Addendum)
Continue to take omeprazole twice daily  Follow reflux diet as below  We will check your cholesterol and also screening liver function test today as you are on a medication that can affect your liver  If you continue to have spells of chest discomfort this is likely coming from your esophagus please call us back and we may need to consider gastroenterology referral  Return 3 months to follow-up with new primary care provider, [place] for Gastroesophageal Reflux Disease, Adult When you have gastroesophageal reflux disease (GERD), the foods you eat and your eating habits are very important. Choosing the right foods can help ease your discomfort. Think about working with a nutrition specialist (dietitian) to help you make good choices. What are tips for following this plan?  Meals  Choose healthy foods that are low in fat, such as fruits, vegetables, whole grains, low-fat dairy products, and lean meat, fish, and poultry.  Eat small meals often instead of 3 large meals a day. Eat your meals slowly, and in a place where you are relaxed. Avoid bending over or lying down until 2-3 hours after eating.  Avoid eating meals 2-3 hours before bed.  Avoid drinking a lot of liquid with meals.  Cook foods using methods other than frying. Bake, grill, or broil food instead.  Avoid or limit: ? Chocolate. ? Peppermint or spearmint. ? Alcohol. ? Pepper. ? Black and decaffeinated coffee. ? Black and decaffeinated tea. ? Bubbly (carbonated) soft drinks. ? Caffeinated energy drinks and soft drinks.  Limit high-fat foods such as: ? Fatty meat or fried foods. ? Whole milk, cream, butter, or ice cream. ? Nuts and nut butters. ? Pastries, donuts, and sweets made with butter or shortening.  Avoid foods that cause symptoms. These foods may be different for everyone. Common foods that cause symptoms include: ? Tomatoes. ? Oranges, lemons, and  limes. ? Peppers. ? Spicy food. ? Onions and garlic. ? Vinegar. Lifestyle  Maintain a healthy weight. Ask your doctor what weight is healthy for you. If you need to lose weight, work with your doctor to do so safely.  Exercise for at least 30 minutes for 5 or more days each week, or as told by your doctor.  Wear loose-fitting clothes.  Do not smoke. If you need help quitting, ask your doctor.  Sleep with the head of your bed higher than your feet. Use a wedge under the mattress or blocks under the bed frame to raise the head of the bed. Summary  When you have gastroesophageal reflux disease (GERD), food and lifestyle choices are very important in easing your symptoms.  Eat small meals often instead of 3 large meals a day. Eat your meals slowly, and in a place where you are relaxed.  Limit high-fat foods such as fatty meat or fried foods.  Avoid bending over or lying down until 2-3 hours after eating.  Avoid peppermint and spearmint, caffeine, alcohol, and chocolate. This information is not intended to replace advice given to you by your health care provider. Make sure you discuss any questions you have with your health care provider. Document Released: 06/08/2012 Document Revised: 01/13/2017 Document Reviewed: 01/13/2017 Elsevier Interactive Patient Education  2019 [employer].

## 2019-01-13 NOTE — Assessment & Plan Note (Signed)
History of cocaine use now in remission

## 2019-01-13 NOTE — Assessment & Plan Note (Signed)
Hypercholesterolemia on current therapy  We will need to recheck lipid panel and liver function profile

## 2019-01-14 ENCOUNTER — Telehealth (INDEPENDENT_AMBULATORY_CARE_PROVIDER_SITE_OTHER): Payer: Self-pay

## 2019-01-14 LAB — LIPID PANEL
CHOLESTEROL TOTAL: 141 mg/dL (ref 100–199)
Chol/HDL Ratio: 4 ratio (ref 0.0–5.0)
HDL: 35 mg/dL — AB (ref >39)
LDL CALC: 63 mg/dL (ref 0–99)
Triglycerides: 214 mg/dL — ABNORMAL HIGH (ref 0–149)
VLDL CHOLESTEROL CAL: 43 mg/dL — AB (ref 5–40)

## 2019-01-14 LAB — HEPATIC FUNCTION PANEL
ALT: 15 IU/L (ref 0–44)
AST: 22 IU/L (ref 0–40)
Albumin: 4.1 g/dL (ref 3.8–4.9)
Alkaline Phosphatase: 110 IU/L (ref 39–117)
Bilirubin Total: 0.3 mg/dL (ref 0.0–1.2)
Bilirubin, Direct: 0.11 mg/dL (ref 0.00–0.40)
Total Protein: 7.3 g/dL (ref 6.0–8.5)

## 2019-01-14 NOTE — Telephone Encounter (Signed)
-----   Message from Elsie Stain, MD sent at 01/14/2019 12:05 PM EST ----- Garrett Clark,  pls call the pt and let him know his liver function test is normal and his cholesterol is much better so he should stay on the atorvastatin at current dose   Dr Joya Gaskins

## 2019-01-14 NOTE — Telephone Encounter (Signed)
Patient is aware that liver function is normal and cholesterol is much better. Advised patient to stay on current dose of atorvastatin. [name], CMA

## 2019-02-15 DIAGNOSIS — N509 Disorder of male genital organs, unspecified: Secondary | ICD-10-CM | POA: Diagnosis not present

## 2019-03-08 DIAGNOSIS — N509 Disorder of male genital organs, unspecified: Secondary | ICD-10-CM | POA: Diagnosis not present

## 2019-03-22 ENCOUNTER — Telehealth: Payer: Self-pay | Admitting: Physician Assistant

## 2019-03-22 NOTE — Telephone Encounter (Signed)
Patient called because he wants to talk about the [place] in regards t him being high risk for having kidney cancer and he is still working. Please follow up

## 2019-03-22 NOTE — Telephone Encounter (Signed)
Patient is concern with COVID-19 his risk and family states 1 brother has postate ca and under treatment. Patient states  he had kidney cancer and his chart indicated  12//2018 chart indicates. laparoscopic nepephrectomy . Advised patient he is not at any greater risk than anyone else . Also advised to take precaution social distance , mask and gloves if needed. Patient works at [employer].

## 2019-03-22 NOTE — Telephone Encounter (Signed)
CMA attempt to call patient back. No answer and left a VM.

## 2019-04-14 ENCOUNTER — Ambulatory Visit: Payer: Medicaid Other | Attending: Primary Care | Admitting: Primary Care

## 2019-04-14 ENCOUNTER — Encounter: Payer: Self-pay | Admitting: Primary Care

## 2019-04-14 ENCOUNTER — Other Ambulatory Visit: Payer: Self-pay

## 2019-04-14 ENCOUNTER — Ambulatory Visit (INDEPENDENT_AMBULATORY_CARE_PROVIDER_SITE_OTHER): Payer: Self-pay | Admitting: Primary Care

## 2019-04-14 DIAGNOSIS — K219 Gastro-esophageal reflux disease without esophagitis: Secondary | ICD-10-CM

## 2019-04-14 DIAGNOSIS — C642 Malignant neoplasm of left kidney, except renal pelvis: Secondary | ICD-10-CM | POA: Diagnosis not present

## 2019-04-14 DIAGNOSIS — F1411 Cocaine abuse, in remission: Secondary | ICD-10-CM | POA: Diagnosis not present

## 2019-04-14 DIAGNOSIS — M79604 Pain in right leg: Secondary | ICD-10-CM | POA: Diagnosis not present

## 2019-04-14 NOTE — Progress Notes (Signed)
Patient verified DOB Patient has taken medication today. Patient has eaten today. Patient complains of heel of right foot hurting from 2010 from playing basketball in prison. Patient complains of area flares up and limits his walking. Pain is described as shooting pain.

## 2019-04-14 NOTE — Progress Notes (Signed)
Virtual Visit via Telephone Note  I connected with Garrett Clark on 04/14/19 at 12:00 by telephone and verified that I am speaking with the correct person using two identifiers.   I discussed the limitations, risks, security and privacy concerns of performing an evaluation and management service by telephone and the availability of in person appointments. I also discussed with the patient that there may be a patient responsible charge related to this service. The patient expressed understanding and agreed to proceed.   History of Present Illness: Garrett Clark was tel visit for c/o pain in leg with swelling and back pain. He shared he had a fx in prison and never had it fix. He has a PMH: left radical nephrectomy, GERD, hx of illicit drug abuse and pure hypercholesterolemia.   Observations/Objective: Review of Systems  Constitutional: Negative.   HENT: Negative.   Eyes: Negative.   Respiratory: Negative.   Cardiovascular: Positive for leg swelling.       Right leg swells hx of knee fx  Gastrointestinal: Negative.   Genitourinary: Negative.   Musculoskeletal: Positive for back pain.  Skin: Negative.   Neurological: Negative.   Endo/Heme/Allergies: Negative.   Psychiatric/Behavioral: Negative.      Assessment and Plan: Garrett Clark was seen today for foot pain.  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 The patient was provided an opportunity to ask questions and all were answered. The patient agreed with the plan and demonstrated an understanding of the instructions.   The patient was advised to call back  or seek an in-person evaluation if the symptoms worsen or if the condition fails to improve as anticipated.  I provided 30 minutes of non-face-to-face time during this encounter.   Kerin Perna, NP

## 2019-05-11 ENCOUNTER — Other Ambulatory Visit: Payer: Self-pay | Admitting: Primary Care

## 2019-05-11 DIAGNOSIS — M79604 Pain in right leg: Secondary | ICD-10-CM

## 2019-05-12 ENCOUNTER — Other Ambulatory Visit: Payer: Self-pay | Admitting: Primary Care

## 2019-05-12 ENCOUNTER — Other Ambulatory Visit: Payer: Self-pay | Admitting: Pharmacist

## 2019-05-12 DIAGNOSIS — M79601 Pain in right arm: Secondary | ICD-10-CM

## 2019-05-12 DIAGNOSIS — M79602 Pain in left arm: Secondary | ICD-10-CM

## 2019-05-12 MED ORDER — GABAPENTIN 300 MG PO CAPS: 300.0000 mg | ORAL_CAPSULE | Freq: Two times a day (BID) | ORAL | 3 refills | Status: DC

## 2019-06-20 ENCOUNTER — Encounter (INDEPENDENT_AMBULATORY_CARE_PROVIDER_SITE_OTHER): Payer: Self-pay | Admitting: Primary Care

## 2019-06-20 ENCOUNTER — Other Ambulatory Visit: Payer: Self-pay

## 2019-06-20 ENCOUNTER — Ambulatory Visit (INDEPENDENT_AMBULATORY_CARE_PROVIDER_SITE_OTHER): Payer: Medicaid Other | Admitting: Primary Care

## 2019-06-20 DIAGNOSIS — M25571 Pain in right ankle and joints of right foot: Secondary | ICD-10-CM

## 2019-06-20 DIAGNOSIS — H539 Unspecified visual disturbance: Secondary | ICD-10-CM | POA: Diagnosis not present

## 2019-06-20 MED ORDER — CYCLOBENZAPRINE HCL 5 MG PO TABS: 5.0000 mg | ORAL_TABLET | Freq: Every evening | ORAL | 3 refills | Status: DC | PRN

## 2019-06-20 NOTE — Progress Notes (Signed)
Pt complains of migraines over left eye off and on for the last three years.

## 2019-06-20 NOTE — Progress Notes (Signed)
Virtual Visit via Telephone Note  I connected with Garrett Clark on 06/20/19 at  3:50 PM EDT by telephone and verified that I am speaking with the correct person using two identifiers.   I discussed the limitations, risks, security and privacy concerns of performing an evaluation and management service by telephone and the availability of in person appointments. I also discussed with the patient that there may be a patient responsible charge related to this service. The patient expressed understanding and agreed to proceed.   History of Present Illness: Garrett Clark is having a tele visit for chronic back pain and intermittent headaches.  Back pain has been chronic he occurred a injury while in prison and never had follow-up or treatment per patient.  Back pain is 8 out of 10.  Intermittent headaches may be associated with his vision changes he is unable to recall the last time he had an eye exam.  .Obserervations/Objective: Review of Systems  Eyes:       Vision disturbance  Respiratory: Negative.   Musculoskeletal: Positive for back pain.       Right foot  Neurological: Positive for headaches.       Eye twitches  Endo/Heme/Allergies: Negative.   Psychiatric/Behavioral: Negative.   All other systems reviewed and are negative.   Assessment and Plan: Garrett Clark was seen today for back pain, foot pain and medication refill.  Diagnoses and all orders for this visit:  Vision changes Will refer to ophthalmology to evaluate vision which in turn may resolve headaches.  Eye twitching can be related to involuntary movement of the optic nerve. -     Ambulatory referral to Ophthalmology  Pain in joint involving right ankle and foot He denies swelling around the ankle however when he is walking increased pain occurs in the area of his ankle and foot.  Encouraged to wear supportive shoes.         -Ambulatory referral to orthopedics.   Other orders -     cyclobenzaprine (FLEXERIL) 5 MG tablet;  Take 1 tablet (5 mg total) by mouth at bedtime as needed for muscle spasms.    Follow Up Instructions:    I discussed the assessment and treatment plan with the patient. The patient was provided an opportunity to ask questions and all were answered. The patient agreed with the plan and demonstrated an understanding of the instructions.   The patient was advised to call back or seek an in-person evaluation if the symptoms worsen or if the condition fails to improve as anticipated.  I provided 15 minutes of non-face-to-face time during this encounter.   Kerin Perna, NP

## 2019-06-28 DIAGNOSIS — M7662 Achilles tendinitis, left leg: Secondary | ICD-10-CM | POA: Diagnosis not present

## 2019-06-28 DIAGNOSIS — M7752 Other enthesopathy of left foot: Secondary | ICD-10-CM | POA: Diagnosis not present

## 2019-06-28 DIAGNOSIS — S92014A Nondisplaced fracture of body of right calcaneus, initial encounter for closed fracture: Secondary | ICD-10-CM | POA: Diagnosis not present

## 2019-06-28 DIAGNOSIS — M7661 Achilles tendinitis, right leg: Secondary | ICD-10-CM | POA: Diagnosis not present

## 2019-07-05 DIAGNOSIS — S92014D Nondisplaced fracture of body of right calcaneus, subsequent encounter for fracture with routine healing: Secondary | ICD-10-CM | POA: Diagnosis not present

## 2019-07-05 DIAGNOSIS — M7661 Achilles tendinitis, right leg: Secondary | ICD-10-CM | POA: Diagnosis not present

## 2019-07-19 DIAGNOSIS — M7662 Achilles tendinitis, left leg: Secondary | ICD-10-CM | POA: Diagnosis not present

## 2019-07-19 DIAGNOSIS — S92001D Unspecified fracture of right calcaneus, subsequent encounter for fracture with routine healing: Secondary | ICD-10-CM | POA: Diagnosis not present

## 2019-07-27 DIAGNOSIS — C642 Malignant neoplasm of left kidney, except renal pelvis: Secondary | ICD-10-CM | POA: Diagnosis not present

## 2019-07-29 DIAGNOSIS — S92014D Nondisplaced fracture of body of right calcaneus, subsequent encounter for fracture with routine healing: Secondary | ICD-10-CM | POA: Diagnosis not present

## 2019-07-29 DIAGNOSIS — M7662 Achilles tendinitis, left leg: Secondary | ICD-10-CM | POA: Diagnosis not present

## 2019-07-29 DIAGNOSIS — M7661 Achilles tendinitis, right leg: Secondary | ICD-10-CM | POA: Diagnosis not present

## 2019-08-03 ENCOUNTER — Ambulatory Visit: Payer: Medicaid Other | Admitting: Urology

## 2019-08-10 ENCOUNTER — Ambulatory Visit (INDEPENDENT_AMBULATORY_CARE_PROVIDER_SITE_OTHER): Payer: Medicaid Other | Admitting: Urology

## 2019-08-10 DIAGNOSIS — M5489 Other dorsalgia: Secondary | ICD-10-CM | POA: Diagnosis not present

## 2019-08-10 DIAGNOSIS — C642 Malignant neoplasm of left kidney, except renal pelvis: Secondary | ICD-10-CM

## 2019-09-03 ENCOUNTER — Other Ambulatory Visit: Payer: Self-pay | Admitting: Primary Care

## 2019-09-03 DIAGNOSIS — M79601 Pain in right arm: Secondary | ICD-10-CM

## 2019-10-24 ENCOUNTER — Other Ambulatory Visit (HOSPITAL_COMMUNITY): Payer: Self-pay | Admitting: Urology

## 2019-10-24 ENCOUNTER — Other Ambulatory Visit: Payer: Self-pay | Admitting: Urology

## 2019-10-24 DIAGNOSIS — C649 Malignant neoplasm of unspecified kidney, except renal pelvis: Secondary | ICD-10-CM

## 2019-10-26 ENCOUNTER — Ambulatory Visit (INDEPENDENT_AMBULATORY_CARE_PROVIDER_SITE_OTHER): Payer: Medicaid Other | Admitting: Primary Care

## 2019-10-26 ENCOUNTER — Encounter (INDEPENDENT_AMBULATORY_CARE_PROVIDER_SITE_OTHER): Payer: Self-pay | Admitting: Primary Care

## 2019-10-26 ENCOUNTER — Other Ambulatory Visit: Payer: Self-pay

## 2019-10-26 VITALS — BP 126/82 | HR 93 | Temp 97.5°F | Ht 69.0 in | Wt 187.4 lb

## 2019-10-26 DIAGNOSIS — M25571 Pain in right ankle and joints of right foot: Secondary | ICD-10-CM

## 2019-10-26 DIAGNOSIS — M79601 Pain in right arm: Secondary | ICD-10-CM

## 2019-10-26 DIAGNOSIS — E7841 Elevated Lipoprotein(a): Secondary | ICD-10-CM | POA: Diagnosis not present

## 2019-10-26 DIAGNOSIS — R42 Dizziness and giddiness: Secondary | ICD-10-CM

## 2019-10-26 DIAGNOSIS — M79602 Pain in left arm: Secondary | ICD-10-CM | POA: Diagnosis not present

## 2019-10-26 DIAGNOSIS — R202 Paresthesia of skin: Secondary | ICD-10-CM | POA: Diagnosis not present

## 2019-10-26 DIAGNOSIS — M5441 Lumbago with sciatica, right side: Secondary | ICD-10-CM | POA: Diagnosis not present

## 2019-10-26 MED ORDER — ATORVASTATIN CALCIUM 40 MG PO TABS: 40.0000 mg | ORAL_TABLET | Freq: Every day | ORAL | 1 refills | Status: DC

## 2019-10-26 MED ORDER — CYCLOBENZAPRINE HCL 10 MG PO TABS: 10.0000 mg | ORAL_TABLET | Freq: Three times a day (TID) | ORAL | 2 refills | Status: DC | PRN

## 2019-10-26 MED ORDER — GABAPENTIN 300 MG PO CAPS: ORAL_CAPSULE | ORAL | 3 refills | Status: DC

## 2019-10-26 NOTE — Progress Notes (Signed)
187.4  Left knee pain and soreness- broke it about 4-5 years ago  Foot pain especially after standing on it for long periods of time  Back pain right side from hip to thigh swells at times and causes pain; left side back pain after working lifting boxes. Pt takes 2 muscle relaxer and gabapentin before and after work  Wakes up in the morning with light headedness

## 2019-10-26 NOTE — Patient Instructions (Signed)

## 2019-10-26 NOTE — Progress Notes (Signed)
Established Patient Office Visit  Subjective:  Patient ID: Garrett Clark, male    DOB: 11/30/1961  Age: 58 y.o. MRN: [id number]  CC:  Chief Complaint  Patient presents with  . Pain    HPI Garrett Clark presents for several problems with musculoskeletal problems- lower back pain and flank pain, migrates to right hip down to inner thigh and swells. 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 He also has bilateral knee pain he works at [employer] and [employer] daily repetitive motion, parenthesis in his feet followed by Dr. Gershon Mussel he was suppose to wear insoles but cost $300 and unable to afford it.   Past Medical History:  Diagnosis Date  . Cancer ([place])    Left kidney  . Hyperlipidemia   . Internal hemorrhoids   . Polycystic liver disease   . Substance abuse [facility])     Past Surgical History:  Procedure Laterality Date  . COLONOSCOPY    . HEMORRHOID BANDING    . ROBOT ASSISTED LAPAROSCOPIC NEPHRECTOMY Left 12/07/2017   Procedure: XI ROBOTIC ASSISTED LAPAROSCOPIC NEPHRECTOMY;  Surgeon: Cleon Gustin, MD;  Location: [facility];  Service: Urology;  Laterality: Left;    Family History  Problem Relation Age of Onset  . Diabetes Mother   . Hypertension Mother   . Alcohol abuse Mother   . Diabetes Father   . Kidney disease Father   . Diabetes Sister   . Stroke Sister   . Diabetes Paternal Grandmother   . Cancer Paternal Grandfather        type unknown  . Colon cancer Neg Hx     Social History   Socioeconomic History  . Marital status: Single    Spouse name: Not on file  . Number of children: 2  . Years of education: 66  . Highest education level: Not on file  Occupational History    Employer: [employer]  . Financial resource strain: Not on file  . Food insecurity    Worry: Not on file    Inability: Not on file  . Transportation needs    Medical: Not on file    Non-medical: Not on file  Tobacco Use  . Smoking status: Former  Smoker    Types: Cigarettes    Quit date: 12/03/2012    Years since quitting: 6.8  . Smokeless tobacco: Never Used  Substance and Sexual Activity  . Alcohol use: Yes    Comment: Occasional  . 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 Sexual activity: Yes  Lifestyle  . Physical activity    Days per week: Not on file    Minutes per session: Not on file  . Stress: Not on file  Relationships  . Social Herbalist on phone: Not on file    Gets together: Not on file    Attends religious service: Not on file    Active member of club or organization: Not on file    Attends meetings of clubs or organizations: Not on file    Relationship status: Not on file  . Intimate partner violence    Fear of current or ex partner: Not on file    Emotionally abused: Not on file    Physically abused: Not on  file    Forced sexual activity: Not on file  Other Topics Concern  . Not on file  Social History Narrative   Lives with girlfriend and 85 year old daughter.  Has 2 children.  Works at [employer].  Education: high school.     Outpatient Medications Prior to Visit  Medication Sig Dispense Refill  . atorvastatin (LIPITOR) 40 MG tablet TAKE 1 TABLET BY MOUTH EVERY DAY 90 tablet 3  . cyclobenzaprine (FLEXERIL) 5 MG tablet Take 1 tablet (5 mg total) by mouth at bedtime as needed for muscle spasms. 30 tablet 3  . gabapentin (NEURONTIN) 300 MG capsule TAKE 1 CAPSULE(300 MG) BY MOUTH TWICE DAILY 60 capsule 3   No facility-administered medications prior to visit.     No Known Allergies  ROS Review of Systems  Genitourinary: Positive for enuresis.  Musculoskeletal: Positive for back pain.       Bilateral knee pain  Foot right chip bone per patient  Neurological: Positive for light-headedness.       When wakes up in morning   All other systems reviewed and are  negative.     Objective:    Physical Exam  BP 126/82 (BP Location: Left Arm, Patient Position: Sitting, Cuff Size: Normal)   Pulse 93   Temp (!) 97.5 F (36.4 C) (Temporal)   Ht 5\' 9"  (1.753 m)   Wt 187 lb 6.4 oz (85 kg)   SpO2 97%   BMI 27.67 kg/m  Wt Readings from Last 3 Encounters:  10/26/19 187 lb 6.4 oz (85 kg)  01/13/19 188 lb 9.6 oz (85.5 kg)  09/28/18 183 lb (83 kg)     Health Maintenance Due  Topic Date Due  . INFLUENZA VACCINE  07/23/2019    There are no preventive care reminders to display for this patient.  Lab Results  Component Value Date   TSH 0.602 07/27/2018   Lab Results  Component Value Date   WBC 5.5 05/08/2018   HGB 17.0 05/08/2018   HCT 50.0 05/08/2018   MCV 91.5 05/08/2018   PLT 329 05/08/2018   Lab Results  Component Value Date   NA 142 09/28/2018   K 4.3 09/28/2018   CO2 21 09/28/2018   GLUCOSE 98 09/28/2018   BUN 19 09/28/2018   CREATININE 1.68 (H) 09/28/2018   BILITOT 0.3 01/13/2019   ALKPHOS 110 01/13/2019   AST 22 01/13/2019   ALT 15 01/13/2019   PROT 7.3 01/13/2019   ALBUMIN 4.1 01/13/2019   CALCIUM 9.2 09/28/2018   ANIONGAP 10 02/18/2018   Lab Results  Component Value Date   CHOL 141 01/13/2019   Lab Results  Component Value Date   HDL 35 (L) 01/13/2019   Lab Results  Component Value Date   LDLCALC 63 01/13/2019   Lab Results  Component Value Date   TRIG 214 (H) 01/13/2019   Lab Results  Component Value Date   CHOLHDL 4.0 01/13/2019   No results found for: HGBA1C    Assessment & Plan:  [name] was seen today for pain.  Diagnoses and all orders for this visit:  Low back pain with right-sided sciatica, unspecified back pain laterality, unspecified chronicity -     Ambulatory referral to Orthopedic Surgery  Elevated lipoprotein(a) Decrease your fatty foods, red meat, cheese, milk and increase fiber like whole grains and veggies. You can also add a fiber supplement like Metamucil or Benefiber. -      atorvastatin (LIPITOR) 40 MG tablet; Take 1 tablet (40  mg total) by mouth daily. -     Lipid panel  Paresthesia and pain of both upper extremities -     gabapentin (NEURONTIN) 300 MG capsule; TAKE 1 CAPSULE(300 MG) BY MOUTH TWICE DAILY -     Ambulatory referral to Orthopedic Surgery  Pain in joint involving right ankle and foot n. May alternate with heat and ice application for pain relief. May also alternate with acetaminophen and Ibuprofen as prescribed pain relief. Other alternatives include massage, acupuncture and water aerobics.  You must stay active and avoid a sedentary lifestyle. -     Ambulatory referral to Orthopedic Surgery  Other orders -     cyclobenzaprine (FLEXERIL) 10 MG tablet; Take 1 tablet (10 mg total) by mouth 3 (three) times daily as needed for muscle spasms.    Follow-up: Return in about 6 months (around 04/24/2020) for hyperlipidemia labs refills.    Kerin Perna, NP

## 2019-10-27 LAB — LIPID PANEL
Chol/HDL Ratio: 4.1 ratio (ref 0.0–5.0)
Cholesterol, Total: 153 mg/dL (ref 100–199)
HDL: 37 mg/dL — ABNORMAL LOW (ref >39)
LDL Chol Calc (NIH): 92 mg/dL (ref 0–99)
Triglycerides: 137 mg/dL (ref 0–149)
VLDL Cholesterol Cal: 24 mg/dL (ref 5–40)

## 2019-10-28 ENCOUNTER — Telehealth (INDEPENDENT_AMBULATORY_CARE_PROVIDER_SITE_OTHER): Payer: Self-pay

## 2019-10-28 NOTE — Telephone Encounter (Signed)
Left voicemail notifying patient that HDL is elevated and to continue taking Atorvastatin 40 mg at bedtime and increase exercise. Return call to [facility] at [phone] with any questions or concerns. [name], CMA

## 2019-10-28 NOTE — Telephone Encounter (Signed)
-----   Message from Kerin Perna, NP sent at 10/28/2019  8:40 AM EST ----- Lipids are normal except HDL increase exercing continue atorvastatin 40mg  at bedtime

## 2019-11-09 ENCOUNTER — Ambulatory Visit (HOSPITAL_COMMUNITY)
Admission: RE | Admit: 2019-11-09 | Discharge: 2019-11-09 | Disposition: A | Discharge status: Home / Self Care | Payer: Medicaid Other | Source: Ambulatory Visit | Attending: Urology | Admitting: Urology

## 2019-11-09 ENCOUNTER — Other Ambulatory Visit: Payer: Self-pay

## 2019-11-09 DIAGNOSIS — C649 Malignant neoplasm of unspecified kidney, except renal pelvis: Secondary | ICD-10-CM | POA: Diagnosis not present

## 2019-11-09 DIAGNOSIS — C642 Malignant neoplasm of left kidney, except renal pelvis: Secondary | ICD-10-CM | POA: Diagnosis not present

## 2019-11-09 LAB — POCT I-STAT CREATININE: Creatinine, Ser: 1.8 mg/dL — ABNORMAL HIGH (ref 0.61–1.24)

## 2019-11-09 MED ORDER — IOHEXOL 300 MG/ML  SOLN
100.0000 mL | Freq: Once | INTRAMUSCULAR | Status: AC | PRN
  Administered 2019-11-09: 100 mL via INTRAVENOUS

## 2019-11-14 ENCOUNTER — Ambulatory Visit: Payer: Medicaid Other | Admitting: Family Medicine

## 2019-11-21 ENCOUNTER — Ambulatory Visit: Payer: Self-pay

## 2019-11-21 ENCOUNTER — Other Ambulatory Visit: Payer: Self-pay

## 2019-11-21 ENCOUNTER — Ambulatory Visit (INDEPENDENT_AMBULATORY_CARE_PROVIDER_SITE_OTHER): Payer: Medicaid Other | Admitting: Family Medicine

## 2019-11-21 ENCOUNTER — Encounter: Payer: Self-pay | Admitting: Family Medicine

## 2019-11-21 DIAGNOSIS — G8929 Other chronic pain: Secondary | ICD-10-CM | POA: Diagnosis not present

## 2019-11-21 DIAGNOSIS — M7661 Achilles tendinitis, right leg: Secondary | ICD-10-CM | POA: Diagnosis not present

## 2019-11-21 DIAGNOSIS — M79671 Pain in right foot: Secondary | ICD-10-CM | POA: Diagnosis not present

## 2019-11-21 DIAGNOSIS — M25562 Pain in left knee: Secondary | ICD-10-CM

## 2019-11-21 MED ORDER — CELECOXIB 200 MG PO CAPS: 200.0000 mg | ORAL_CAPSULE | Freq: Two times a day (BID) | ORAL | 6 refills | Status: DC | PRN

## 2019-11-21 MED ORDER — NABUMETONE 750 MG PO TABS: 750.0000 mg | ORAL_TABLET | Freq: Two times a day (BID) | ORAL | 6 refills | Status: DC | PRN

## 2019-11-21 MED ORDER — NITROGLYCERIN 0.1 MG/HR TD PT24: MEDICATED_PATCH | TRANSDERMAL | 3 refills | Status: DC

## 2019-11-21 NOTE — Progress Notes (Signed)
Garrett Clark - 58 y.o. male MRN [id number]  Date of birth: 1961-08-25  Office Visit Note: Visit Date: 11/21/2019 PCP: Kerin Perna, NP Referred by: Kerin Perna, NP  Subjective: Chief Complaint  Patient presents with  . Right Foot - Pain    Posterior heel pain. Went to Dr. Lindley Magnus for treatment - no help.  . Left Knee - Pain    H/o "knee fracture" in 2015. Pain medial aspect of knee.   . Lower Back - Pain    Chronic low back pain.   HPI: Garrett Clark is a 58 y.o. male who comes in today with left knee pain and right posterior heel pain.   Left knee pain- reports that he fractured his knee (unclear but possibly patella) 5 years ago. Since that injury, he has had pain over medial knee at joint line. Denies locking, catching, giving out of knee, or swelling. His knee hurts intermittently, some days worse than others. He works at [employer] and is lifting things often. Sometimes knees hurt to touch when he lies down at night.    Right heel- in 2013, he felt a pop in his heel while playing basketball. He did not seek care at the time but just rested it and stopped playing basketball for several years. Reports that the pain has been present since the injury but has hurt more recently. He has noted swelling along posterior heel as well. He saw Dr. Gershon Mussel over the summer who told him that he had a fracture, gave him a corticosteroid injection and placed him in a walking boot for 1 month. Pain briefly improved after that but then returned soon after.   He also has chronic lower back pain but this was not addressed today. He takes flexeril and gabapentin daily. Initially, this was helping with pain but recently he has not had relief.    ROS Otherwise per HPI.  Assessment & Plan: Visit Diagnoses:  1. Chronic heel pain, right   2. Achilles tendinitis, right leg   3. Chronic pain of left knee   Chronic heel pain- achilles calcific tendinitis, with inflammation, thickening of  tendon, as well as calcium deposits at tendon insertion at calcaneous. Will trial nitroglycerin patches and physical therapy.  Left knee pain over medial joint line, likely degenerative meniscal injury. Discussed options of corticosteroid injection, MRI and surgical referral if pain or symptoms are significant enough to desire surgery. Also discussed PT to work on quad strengthening and knee stability. Elected to to trial PT and anti-inflammatory Celebrex.  Meds & Orders:  Meds ordered this encounter  Medications  . nitroGLYCERIN (NITRO-DUR) 0.1 mg/hr patch    Sig: Apply 1/4 patch to affected area 12 hours daily    Dispense:  30 patch    Refill:  3  . DISCONTD: nabumetone (RELAFEN) 750 MG tablet    Sig: Take 1 tablet (750 mg total) by mouth 2 (two) times daily as needed.    Dispense:  60 tablet    Refill:  6  . celecoxib (CELEBREX) 200 MG capsule    Sig: Take 1 capsule (200 mg total) by mouth 2 (two) times daily as needed.    Dispense:  60 capsule    Refill:  6    Orders Placed This Encounter  Procedures  . MSK [place] - NO CHARGES  . Ambulatory referral to Physical Therapy    Follow-up: PRN  Procedures: No procedures performed  No notes on file   Clinical History:  No specialty comments available.   He reports that he quit smoking about 6 years ago. His smoking use included cigarettes. He has never used smokeless tobacco. No results for input(s): HGBA1C, LABURIC in the last 8760 hours.  Objective:  VS:  HT:    WT:   BMI:     BP:   HR: bpm  TEMP: ( )  RESP:  Physical Exam  PHYSICAL EXAM: Gen: NAD, alert, cooperative with exam, well-appearing HEENT: clear conjunctiva,  CV:  no edema, capillary refill brisk, normal rate Resp: non-labored Skin: no rashes, normal turgor  Neuro: no gross deficits.    Ortho Exam  Left knee - Inspection: no gross deformity. No swelling/effusion, erythema or bruising. Skin intact - Palpation: TTP at medial joint line, clicking appreciated  with McMurray - ROM: full active ROM with flexion and extension in knee and hip - Strength: 5/5 strength - Neuro/vasc: NV intact - Special Tests: - LIGAMENTS: negative anterior and posterior drawer, negative Lachman's, no MCL or LCL laxity  -- MENISCUS: clicking with McMurray's, pain at medial joint line with [place]  -- PF JOINT: nml patellar mobility bilaterally  Right ankle: Inspection: swelling at achilles proximal to calcaneus Palpation: TTP at distal aspect of achilles ROM: Limited dorsiflexion secondary to pain Strength: good strength in all directions Thompson test elicited plantarflexion Unable to do single leg heel raise but can do double heel raise   Imaging: [place] achilles: thickened achilles at tendon insertion point at calcaneus with neovascularization. Calcium deposits noted in tendon. Impression: calcific tendinopathy   Past Medical/Family/Surgical/Social History: Medications & Allergies reviewed per EMR, new medications updated. Patient Active Problem List   Diagnosis Date Noted  . GERD (gastroesophageal reflux disease) 01/13/2019  . History of left radical nephrectomy 01/13/2019  . 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 Prolapsed internal hemorrhoids, grade 2 06/19/2017  . Pure hypercholesterolemia 03/30/2017  . History of cocaine abuse ([place]) 03/23/2017   Past Medical History:  Diagnosis Date  . Cancer ([place])    Left kidney  . Hyperlipidemia   . Internal hemorrhoids   . Polycystic liver disease   . Substance abuse ([place])    Family History  Problem Relation Age of Onset  . Diabetes Mother   . Hypertension Mother   . Alcohol abuse Mother   . Diabetes Father   . Kidney disease Father   . Diabetes Sister   . Stroke Sister   . Diabetes Paternal Grandmother   . Cancer Paternal Grandfather        type unknown  . Colon cancer Neg Hx    Past Surgical History:  Procedure Laterality Date  . COLONOSCOPY    .  HEMORRHOID BANDING    . ROBOT ASSISTED LAPAROSCOPIC NEPHRECTOMY Left 12/07/2017   Procedure: XI ROBOTIC ASSISTED LAPAROSCOPIC NEPHRECTOMY;  Surgeon: Cleon Gustin, MD;  Location: [facility];  Service: Urology;  Laterality: Left;   Social History   Occupational History    Employer: [employer]  Tobacco Use  . Smoking status: Former Smoker    Types: Cigarettes    Quit date: 12/03/2012    Years since quitting: 6.9  . Smokeless tobacco: Never Used  Substance and Sexual Activity  . Alcohol use: Yes    Comment: Occasional  . 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 Sexual activity: Yes

## 2019-11-21 NOTE — Progress Notes (Signed)
I saw and examined the patient with Dr. Mayer Masker and agree with assessment and plan as outlined.    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    Chronic right heel pain with exam and ultrasound consistent with Achilles tendinopathy. Had cortisone injection and wore fracture boot at outside podiatry clinic.  Will try PT; nitro-dur patches for heel; celebrex by mouth.  Consider MRI of one or both areas if pain persists.

## 2019-11-30 ENCOUNTER — Other Ambulatory Visit: Payer: Self-pay

## 2019-11-30 ENCOUNTER — Ambulatory Visit: Payer: Medicaid Other | Attending: Family Medicine | Admitting: Physical Therapy

## 2019-11-30 ENCOUNTER — Encounter: Payer: Self-pay | Admitting: Physical Therapy

## 2019-11-30 DIAGNOSIS — M25571 Pain in right ankle and joints of right foot: Secondary | ICD-10-CM | POA: Insufficient documentation

## 2019-11-30 DIAGNOSIS — G8929 Other chronic pain: Secondary | ICD-10-CM | POA: Diagnosis present

## 2019-11-30 DIAGNOSIS — R2689 Other abnormalities of gait and mobility: Secondary | ICD-10-CM | POA: Diagnosis present

## 2019-11-30 DIAGNOSIS — M25562 Pain in left knee: Secondary | ICD-10-CM | POA: Diagnosis present

## 2019-11-30 DIAGNOSIS — M6281 Muscle weakness (generalized): Secondary | ICD-10-CM | POA: Diagnosis present
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
## 2019-12-07 ENCOUNTER — Other Ambulatory Visit: Payer: Self-pay

## 2019-12-07 ENCOUNTER — Ambulatory Visit: Payer: Medicaid Other | Admitting: Physical Therapy

## 2019-12-07 ENCOUNTER — Encounter: Payer: Self-pay | Admitting: Physical Therapy

## 2019-12-07 DIAGNOSIS — M6281 Muscle weakness (generalized): Secondary | ICD-10-CM

## 2019-12-07 DIAGNOSIS — M25562 Pain in left knee: Secondary | ICD-10-CM

## 2019-12-07 DIAGNOSIS — G8929 Other chronic pain: Secondary | ICD-10-CM

## 2019-12-07 DIAGNOSIS — M25571 Pain in right ankle and joints of right foot: Secondary | ICD-10-CM

## 2019-12-07 DIAGNOSIS — R2689 Other abnormalities of gait and mobility: Secondary | ICD-10-CM

## 2019-12-07 NOTE — Therapy (Signed)
[place], [place], [address] Phone: [phone]   Fax:  [phone]  Physical Therapy Treatment  Patient Details  Name: Garrett Clark MRN: [id number] Date of Birth: 28-Oct-1961 Referring Provider (PT): Eunice Blase, MD    Encounter Date: 12/07/2019  PT End of Session - 12/07/19 1348    Visit Number  2    Number of Visits  13    Date for PT Re-Evaluation  01/25/20    Authorization Type  MCD: Resubmit at 4th visit.    PT Start Time  1334    PT Stop Time  1415    PT Time Calculation (min)  41 min    Activity Tolerance  Patient tolerated treatment well    Behavior During Therapy  WFL for tasks assessed/performed       Past Medical History:  Diagnosis Date  . Cancer ([place])    Left kidney  . Hyperlipidemia   . Internal hemorrhoids   . Polycystic liver disease   . Substance abuse [facility])     Past Surgical History:  Procedure Laterality Date  . COLONOSCOPY    . HEMORRHOID BANDING    . ROBOT ASSISTED LAPAROSCOPIC NEPHRECTOMY Left 12/07/2017   Procedure: XI ROBOTIC ASSISTED LAPAROSCOPIC NEPHRECTOMY;  Surgeon: Cleon Gustin, MD;  Location: [facility];  Service: Urology;  Laterality: Left;    There were no vitals filed for this visit.  Subjective Assessment - 12/07/19 1344    Subjective  Pt arriving to therapy reporting 7/10 R ankle heel. Pt reporting not doing any of  his home exercises since his last visit,    Limitations  Standing;Walking    How long can you sit comfortably?  unlimited    How long can you stand comfortably?  unlimited    How long can you walk comfortably?  unlimited    Diagnostic tests  x-ray at MD's office    Patient Stated Goals  to decrease the pain    Currently in Pain?  Yes    Pain Score  7     Pain Location  Ankle    Pain Orientation  Right    Pain Descriptors / Indicators  Aching;Throbbing;Burning    Pain Type  Chronic pain    Pain Onset  More than a month ago                        [facility] Adult PT Treatment/Exercise - 12/07/19 0001      Ambulation/Gait   Gait Comments  Pt amb with antalgic gait pattern. Pt instrcuted in equalized wt bearing and heel sti      Exercises   Exercises  Knee/Hip;Ankle      Knee/Hip Exercises: Stretches   Other Knee/Hip Stretches  forward lunge to stretch heel cord, and hip flexors holding 30 seconds x 3 reps      Knee/Hip Exercises: Standing   Heel Raises  Both;15 reps;2 seconds    Heel Raises Limitations  toes raises x 15 holding 2 seconds    Hip Abduction  Stengthening;Both;15 reps;Knee straight    Abduction Limitations  using bilateral UE support on back of chair      Manual Therapy   Manual Therapy  Soft tissue mobilization;Passive ROM    Manual therapy comments  gastroc, soleus, heel cord   biofreeze   Passive ROM  DF/PF, Inversion/eversion      Ankle Exercises: Stretches   Gastroc Stretch  2 reps;30 seconds  Ankle Exercises: Aerobic   Recumbent Bike  5 minutes L2      Ankle Exercises: Seated   ABC's  1 rep    Other Seated Ankle Exercises  pro-stretch x 10 holding 10 seconds each, seated rocker board side to side motion x 1 minute               PT Short Term Goals - 12/07/19 1352      PT SHORT TERM GOAL #1   Title  pt to be I with inital HEP    Baseline  reviewed during session    Time  3    Period  Weeks    Status  On-going    Target Date  12/21/19      PT SHORT TERM GOAL #2   Title  pt to verbalize and demo proper gait biomechanics to reduce antalgic gait pattern and abnormal stress to reduce and prevent ankle/ knee pain    Baseline  no knowledge of proper gait biomechanics.    Time  3    Period  Weeks    Status  On-going        PT Long Term Goals - 12/07/19 1352      PT LONG TERM GOAL #1   Title  pt increase bil hip abductors and R ankle PF strength to >/= 4+/5 to promote knee and ankle stability with >/= 2/10 pain    Baseline  4/5 ankle PF with pain at  6/10 during testing on the R andbil hp abductors 4/5    Time  6    Period  Weeks    Status  New      PT LONG TERM GOAL #2   Title  pt to be able to walk / stand >/= 60 min with </= 2/10 max pain for functional endurance required for work related activities    Baseline  reports pain increases to 10/10 max on the R ankle    Time  6    Period  Weeks    Status  New      PT LONG TERM GOAL #3   Title  pt to be I with all HEP given as of last visit to maintain and progress current level of function    Baseline  no previous HEP    Time  6    Period  Weeks    Status  New            Plan - 12/07/19 1348    Clinical Impression Statement  Pt arriving to therapy reporting 7/10 pain in R heel. Pt reported he has been wearing an orthotic in his R shoe at work, but didn't have it on during therapy. Pt still amb with antalgic gait pattern with decreased weight through his R heel. Pt instructed in heel to toe gait pattern. Pt tolerating exercises well, reporting pain decreased to 4/10 by end of session. Continue skilled PT to progress toward goals set.    Personal Factors and Comorbidities  Comorbidity 1;Social Background;Past/Current Experience    Comorbidities  hx of CX,    Examination-Activity Limitations  Lift;Stand;Stairs;Locomotion Level    Stability/Clinical Decision Making  Evolving/Moderate complexity    Rehab Potential  Good    PT Frequency  2x / week    PT Duration  6 weeks    PT Treatment/Interventions  ADLs/Self Care Home Management;Cryotherapy;Gait training;Stair training;Therapeutic activities;Therapeutic exercise;Balance training;Neuromuscular re-education;Patient/family education;Manual techniques;Passive range of motion;Dry needling;Taping;Vasopneumatic Device    PT Next Visit Plan  (  hx of CX no MHP or E-stim), review HEP and update PRN, ankle STW along gastroc/ soleus and achilles, gross bil LE strengthening, gait training, balance training    PT Home Exercise Plan  [id number] -  calf stretch (seated and standing), SLR, hip abduction, ankle PF with eccentrics, bridges    Consulted and Agree with Plan of Care  Patient       Patient will benefit from skilled therapeutic intervention in order to improve the following deficits and impairments:  Abnormal gait, Improper body mechanics, Decreased strength, Postural dysfunction, Decreased activity tolerance, Decreased balance, Decreased endurance, Pain  Visit Diagnosis: Pain in right ankle and joints of right foot  Chronic pain of left knee  Muscle weakness (generalized)  Other abnormalities of gait and mobility     Problem List Patient Active Problem List   Diagnosis Date Noted  . GERD (gastroesophageal reflux disease) 01/13/2019  . History of left radical nephrectomy 01/13/2019  . 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 ([place]) 12/07/2017  . Prolapsed internal hemorrhoids, grade 2 06/19/2017  . Pure hypercholesterolemia 03/30/2017  . History of cocaine abuse ([place]) 03/23/2017    Oretha Caprice, PT 12/07/2019, 2:14 PM  [facility] [address] [place], [place], [address] Phone: [phone]   Fax:  [phone]  Name: DIVONTE FITZMORRIS MRN: [id number] Date of Birth: 05-15-1961

## 2019-12-12 ENCOUNTER — Other Ambulatory Visit: Payer: Self-pay

## 2019-12-12 DIAGNOSIS — C642 Malignant neoplasm of left kidney, except renal pelvis: Secondary | ICD-10-CM

## 2019-12-13 ENCOUNTER — Other Ambulatory Visit: Payer: Self-pay

## 2019-12-13 ENCOUNTER — Encounter: Payer: Self-pay | Admitting: Physical Therapy

## 2019-12-13 ENCOUNTER — Ambulatory Visit: Payer: Medicaid Other | Admitting: Physical Therapy

## 2019-12-13 DIAGNOSIS — M6281 Muscle weakness (generalized): Secondary | ICD-10-CM

## 2019-12-13 DIAGNOSIS — M25571 Pain in right ankle and joints of right foot: Secondary | ICD-10-CM

## 2019-12-13 DIAGNOSIS — G8929 Other chronic pain: Secondary | ICD-10-CM

## 2019-12-13 DIAGNOSIS — M25562 Pain in left knee: Secondary | ICD-10-CM

## 2019-12-13 DIAGNOSIS — R2689 Other abnormalities of gait and mobility: Secondary | ICD-10-CM

## 2019-12-13 NOTE — Therapy (Signed)
[place], [place], [address] Phone: [phone]   Fax:  [phone]  Physical Therapy Treatment  Patient Details  Name: Garrett Clark MRN: [id number] Date of Birth: 1961-09-22 Referring Provider (PT): Eunice Blase, MD    Encounter Date: 12/13/2019  PT End of Session - 12/13/19 1214    Visit Number  3    Number of Visits  13    Date for PT Re-Evaluation  01/25/20    Authorization Type  MCD: Resubmit at 4th visit.    Authorization - Visit Number  2    Authorization - Number of Visits  3    PT Start Time  1217    PT Stop Time  1300    PT Time Calculation (min)  43 min    Activity Tolerance  Patient limited by pain    Behavior During Therapy  [facility] for tasks assessed/performed       Past Medical History:  Diagnosis Date  . Cancer ([place])    Left kidney  . Hyperlipidemia   . Internal hemorrhoids   . Polycystic liver disease   . Substance abuse [facility])     Past Surgical History:  Procedure Laterality Date  . COLONOSCOPY    . HEMORRHOID BANDING    . ROBOT ASSISTED LAPAROSCOPIC NEPHRECTOMY Left 12/07/2017   Procedure: XI ROBOTIC ASSISTED LAPAROSCOPIC NEPHRECTOMY;  Surgeon: Cleon Gustin, MD;  [facility];  Service: Urology;  Laterality: Left;    There were no vitals filed for this visit.  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  Yes    Pain Score  7     Pain Location  Leg    Pain Orientation  Right    Pain Descriptors / Indicators  Aching;Sore    Pain Type  Chronic pain                       [facility] Adult PT Treatment/Exercise - 12/13/19 0001      Self-Care   Self-Care  Other Self-Care Comments    Other Self-Care Comments   pt had a whole nitro patch on his Rt achilles - orders are for 1/4, instructed to  cut the patche into 4s and use that from now one      Knee/Hip Exercises: Aerobic   Nustep  1.5'', L4, stopped d/t sharp pain in Rt achilles      Modalities   Modalities  Ultrasound      Ultrasound   Ultrasound Location  Rt distal achilles    Ultrasound Parameters  50%, 3.25mHz, 1.0 w/cm2    Ultrasound Goals  Pain      Manual Therapy   Manual therapy comments  initially hypersentivie to palpation in Rt achilles, perform IASTM to the achilles tendon , passive relleases, cross friction massage and then ice massage.      Passive ROM  Rt ankle DF stretching                PT Short Term Goals - 12/07/19 1352      PT SHORT TERM GOAL #1   Title  pt to be I with inital HEP    Baseline  reviewed during session    Time  3    Period  Weeks    Status  On-going    Target Date  12/21/19      PT SHORT TERM GOAL #2   Title  pt to verbalize and demo proper gait biomechanics to reduce antalgic gait pattern and abnormal stress to reduce and prevent ankle/ knee pain    Baseline  no knowledge of proper gait biomechanics.    Time  3    Period  Weeks    Status  On-going        PT Long Term Goals - 12/07/19 1352      PT LONG TERM GOAL #1   Title  pt increase bil hip abductors and R ankle PF strength to >/= 4+/5 to promote knee and ankle stability with >/= 2/10 pain    Baseline  4/5 ankle PF with pain at 6/10 during testing on the R andbil hp abductors 4/5    Time  6    Period  Weeks    Status  New      PT LONG TERM GOAL #2   Title  pt to be able to walk / stand >/= 60 min with </= 2/10 max pain for functional endurance required for work related activities    Baseline  reports pain increases to 10/10 max on the R ankle    Time  6    Period  Weeks    Status  New      PT LONG TERM GOAL #3   Title  pt to be I with all HEP given as of last visit to maintain and progress current level of function    Baseline  no previous HEP    Time  6    Period  Weeks    Status  New             Plan - 12/13/19 1413    Clinical Impression Statement  Pt continues to report high levels of pain in the Rt achilles.  Hypersensitive to palpation.  He has been using whole nitro patch - was informed he is supposed to be cutting them in 4ths.  He verbalized understanding.  AFter [place] and manual work he reported less pain with palpation    Rehab Potential  Good    PT Frequency  2x / week    PT Duration  6 weeks    PT Treatment/Interventions  ADLs/Self Care Home Management;Cryotherapy;Gait training;Stair training;Therapeutic activities;Therapeutic exercise;Balance training;Neuromuscular re-education;Patient/family education;Manual techniques;Passive range of motion;Dry needling;Taping;Vasopneumatic Device    PT Next Visit Plan  (hx of CX no MHP or E-stim),  update HEP if ready, ankle STW along gastroc/ soleus and achilles    Consulted and Agree with Plan of Care  Patient       Patient will benefit from skilled therapeutic intervention in order to improve the following deficits and impairments:  Abnormal gait, Improper body mechanics, Decreased strength, Postural dysfunction, Decreased activity tolerance, Decreased balance, Decreased endurance, Pain  Visit Diagnosis: Pain in right ankle and joints of right foot  Chronic pain of left knee  Muscle weakness (generalized)  Other abnormalities of gait and mobility     Problem List Patient Active Problem List   Diagnosis Date Noted  . GERD (gastroesophageal reflux disease) 01/13/2019  . History of left radical nephrectomy 01/13/2019  . 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 ([place]) 12/07/2017  . Prolapsed internal hemorrhoids, grade 2 06/19/2017  . Pure hypercholesterolemia 03/30/2017  . History of cocaine abuse ([place]) 03/23/2017    Manuela Schwartz  Caroll Weinheimer PT 12/13/2019, 2:15 PM  [facility] [address] [place], [place], [address] Phone: [phone]   Fax:   [phone]  Name: Garrett Clark MRN: [id number] Date of Birth: 1961-11-06

## 2019-12-20 ENCOUNTER — Other Ambulatory Visit: Payer: Self-pay

## 2019-12-20 ENCOUNTER — Encounter: Payer: Self-pay | Admitting: Physical Therapy

## 2019-12-20 ENCOUNTER — Ambulatory Visit: Payer: Medicaid Other | Admitting: Physical Therapy

## 2019-12-20 DIAGNOSIS — M25571 Pain in right ankle and joints of right foot: Secondary | ICD-10-CM

## 2019-12-20 DIAGNOSIS — M25562 Pain in left knee: Secondary | ICD-10-CM

## 2019-12-20 DIAGNOSIS — G8929 Other chronic pain: Secondary | ICD-10-CM

## 2019-12-20 DIAGNOSIS — M6281 Muscle weakness (generalized): Secondary | ICD-10-CM

## 2019-12-20 DIAGNOSIS — R2689 Other abnormalities of gait and mobility: Secondary | ICD-10-CM

## 2019-12-20 NOTE — Therapy (Signed)
[place], [place], [address] Phone: [phone]   Fax:  [phone]  Physical Therapy Treatment / Discharge  Patient Details  Name: [place] MRN: [id number] Date of Birth: [birth year] Referring Provider (PT): Eunice Blase, MD    Encounter Date: 12/20/2019  PT End of Session - 12/20/19 1459    Visit Number  4    Number of Visits  13    Date for PT Re-Evaluation  01/25/20    Authorization Type  MCD: Resubmit at 4th visit.    Authorization - Visit Number  3    Authorization - Number of Visits  3    PT Start Time  1500    PT Stop Time  1538    PT Time Calculation (min)  38 min    Activity Tolerance  Patient limited by pain    Behavior During Therapy  WFL for tasks assessed/performed       Past Medical History:  Diagnosis Date  . Cancer ([place])    Left kidney  . Hyperlipidemia   . Internal hemorrhoids   . Polycystic liver disease   . Substance abuse [facility])     Past Surgical History:  Procedure Laterality Date  . COLONOSCOPY    . HEMORRHOID BANDING    . ROBOT ASSISTED LAPAROSCOPIC NEPHRECTOMY Left 12/07/2017   Procedure: XI ROBOTIC ASSISTED LAPAROSCOPIC NEPHRECTOMY;  Surgeon: Cleon Gustin, MD;  Location: [facility];  Service: Urology;  Laterality: Left;    There were no vitals filed for this visit.  Subjective Assessment - 12/20/19 1503    Subjective  " I am doing pretty good, some soreness in heel earlier today from alot standing but otherwise doing pretty good"    Patient Stated Goals  to decrease the pain    Currently in Pain?  No/denies    Pain Score  0         [facility] PT Assessment - 12/20/19 0001      Strength   Right Hip ABduction  4+/5    Right Ankle Plantar Flexion  4+/5                   [facility] Adult PT Treatment/Exercise - 12/20/19 1519      Knee/Hip Exercises: Stretches   Gastroc Stretch  2 reps;Both;30 seconds   slant board     Knee/Hip Exercises:  Aerobic   Elliptical  L2 x 5 min ramp 2      Knee/Hip Exercises: Standing   Heel Raises  1 set;20 reps;Both    Hip Abduction  1 set;15 reps;Knee straight   with red theraband   Hip Extension  Stengthening;1 set;15 reps;Knee straight   with red theraband   Functional Squat  1 set;10 reps   holding on to sink            PT Education - 12/20/19 1518    Education Details  reviewed previously provided HEp and how to progress strengthening at home wit hincreased reps/ sets to promote endurance and function.  updated HEP for standing heel raise and hip strengthening.    Person(s) Educated  Patient    Methods  Explanation;Verbal cues;Handout    Comprehension  Verbalized understanding;Verbal cues required       PT Short Term Goals - 12/20/19 1504      PT SHORT TERM GOAL #1   Title  pt to be I with inital HEP    Period  Weeks  Status  Achieved      PT SHORT TERM GOAL #2   Title  pt to verbalize and demo proper gait biomechanics to reduce antalgic gait pattern and abnormal stress to reduce and prevent ankle/ knee pain    Period  Weeks    Status  Achieved        PT Long Term Goals - 12/20/19 1507      PT LONG TERM GOAL #1   Title  pt increase bil hip abductors and R ankle PF strength to >/= 4+/5 to promote knee and ankle stability with >/= 2/10 pain    Time  6    Period  Weeks    Status  Achieved      PT LONG TERM GOAL #2   Title  pt to be able to walk / stand >/= 60 min with </= 2/10 max pain for functional endurance required for work related activities    Baseline  reports max pain at 2/10 with prolonged standing    Time  6    Period  Weeks    Status  Achieved      PT LONG TERM GOAL #3   Title  pt to be I with all HEP given as of last visit to maintain and progress current level of function    Baseline  --    Status  Achieved            Plan - 12/20/19 1524    Clinical Impression Statement  Garrett [place] has made great progress with physical therpay and reports  no pain in the achilles or the knee, but does note some soreness with prolonged walking/ standing. He was able to do all exercises today with no report of pain and noted he felt better with activity. He met all goals today and is able to maintain and progress his current level of function independently and will be discharged rom PT today.    PT Treatment/Interventions  ADLs/Self Care Home Management;Cryotherapy;Gait training;Stair training;Therapeutic activities;Therapeutic exercise;Balance training;Neuromuscular re-education;Patient/family education;Manual techniques;Passive range of motion;Dry needling;Taping;Vasopneumatic Device    PT Next Visit Plan  d/C    PT Home Exercise Plan  [id number] - calf stretch (seated and standing), SLR, hip abduction, ankle PF with eccentrics, bridges, standing hip abduction. extension, standing heel raise    Consulted and Agree with Plan of Care  Patient       Patient will benefit from skilled therapeutic intervention in order to improve the following deficits and impairments:  Abnormal gait, Improper body mechanics, Decreased strength, Postural dysfunction, Decreased activity tolerance, Decreased balance, Decreased endurance, Pain  Visit Diagnosis: Pain in right ankle and joints of right foot  Chronic pain of left knee  Muscle weakness (generalized)  Other abnormalities of gait and mobility     Problem List Patient Active Problem List   Diagnosis Date Noted  . GERD (gastroesophageal reflux disease) 01/13/2019  . History of left radical nephrectomy 01/13/2019  . 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 Prolapsed internal hemorrhoids, grade 2 06/19/2017  . Pure hypercholesterolemia 03/30/2017  . History of cocaine abuse ([place]) 03/23/2017     Starr Lake PT, DPT, LAT, ATC  12/20/19  3:36 PM      [place] [facility] [address] [place], [place],  [address] Phone: [phone]   Fax:  [phone]  Name: Garrett Clark MRN: [id number] Date of Birth: [birth year]      PHYSICAL THERAPY DISCHARGE SUMMARY  Visits from Start of Care: 4  Current functional level related to goals / functional outcomes: See goals   Remaining deficits: Intermittent soreness noted after prlonged walking likely as a result of fatigue.    Education / Equipment: HEP, theraband, posture, lifting mechanics,   Plan: Patient agrees to discharge.  Patient goals were met. Patient is being discharged due to being pleased with the current functional level.  ?????         Keagan Anthis PT, DPT, LAT, ATC  12/20/19  3:39 PM

## 2019-12-26 ENCOUNTER — Other Ambulatory Visit (INDEPENDENT_AMBULATORY_CARE_PROVIDER_SITE_OTHER): Payer: Self-pay | Admitting: Primary Care

## 2019-12-27 NOTE — Telephone Encounter (Signed)
FWD to PCP

## 2019-12-28 ENCOUNTER — Telehealth: Payer: Self-pay

## 2019-12-28 NOTE — Telephone Encounter (Signed)
-----   Message from Cleon Gustin, MD sent at 12/28/2019  2:36 PM EST ----- Regarding: RE: ct results No evidence of metastatic disease ----- Message ----- From: Dorisann Frames, RN Sent: 12/21/2019  11:33 AM EST To: Cleon Gustin, MD Subject: Melton Alar: ct results                                  ----- Message ----- From: Charlestine Massed Sent: 12/21/2019  10:59 AM EST To: [facility] [place] [facility] Subject: ct results                                     Patient states he had ct scan in November and has not gotten results.   Pt contact # is [phone]

## 2019-12-28 NOTE — Telephone Encounter (Signed)
Notified of results

## 2020-01-25 ENCOUNTER — Other Ambulatory Visit: Payer: Self-pay

## 2020-01-25 DIAGNOSIS — C642 Malignant neoplasm of left kidney, except renal pelvis: Secondary | ICD-10-CM

## 2020-01-30 DIAGNOSIS — C642 Malignant neoplasm of left kidney, except renal pelvis: Secondary | ICD-10-CM | POA: Diagnosis not present

## 2020-01-31 LAB — COMPREHENSIVE METABOLIC PANEL
AG Ratio: 1.3 (calc) (ref 1.0–2.5)
ALT: 27 U/L (ref 9–46)
AST: 25 U/L (ref 10–35)
Albumin: 4.4 g/dL (ref 3.6–5.1)
Alkaline phosphatase (APISO): 95 U/L (ref 35–144)
BUN/Creatinine Ratio: 12 (calc) (ref 6–22)
BUN: 19 mg/dL (ref 7–25)
CO2: 28 mmol/L (ref 20–32)
Calcium: 9.8 mg/dL (ref 8.6–10.3)
Chloride: 104 mmol/L (ref 98–110)
Creat: 1.65 mg/dL — ABNORMAL HIGH (ref 0.70–1.33)
Globulin: 3.4 g/dL (calc) (ref 1.9–3.7)
Glucose, Bld: 113 mg/dL (ref 65–139)
Potassium: 4.6 mmol/L (ref 3.5–5.3)
Sodium: 139 mmol/L (ref 135–146)
Total Bilirubin: 0.9 mg/dL (ref 0.2–1.2)
Total Protein: 7.8 g/dL (ref 6.1–8.1)

## 2020-02-01 ENCOUNTER — Ambulatory Visit (INDEPENDENT_AMBULATORY_CARE_PROVIDER_SITE_OTHER): Payer: Medicaid Other | Admitting: Urology

## 2020-02-01 ENCOUNTER — Encounter: Payer: Self-pay | Admitting: Urology

## 2020-02-01 ENCOUNTER — Other Ambulatory Visit: Payer: Self-pay

## 2020-02-01 VITALS — BP 125/83 | HR 59 | Temp 96.8°F | Ht 69.0 in | Wt 190.0 lb

## 2020-02-01 DIAGNOSIS — C642 Malignant neoplasm of left kidney, except renal pelvis: Secondary | ICD-10-CM | POA: Diagnosis not present

## 2020-02-01 NOTE — Progress Notes (Signed)
Urological Symptom Review  Patient is experiencing the following symptoms: none  None Review of Systems  Gastrointestinal (upper)  : Negative for upper GI symptoms  Gastrointestinal (lower) : Negative for lower GI symptoms  Constitutional : Negative for symptoms  Skin: Negative for skin symptoms  Eyes: Negative for eye symptoms  Ear/Nose/Throat : Negative for Ear/Nose/Throat symptoms  Hematologic/Lymphatic: Negative for Hematologic/Lymphatic symptoms  Cardiovascular : Negative for cardiovascular symptoms  Respiratory : Negative for respiratory symptoms  Endocrine: Negative for endocrine symptoms  Musculoskeletal: Back pain  Neurological: Negative for neurological symptoms  Psychologic: Negative for psychiatric symptoms

## 2020-02-01 NOTE — Progress Notes (Signed)
02/01/2020 2:48 PM   Garrett Clark Jul 25, 1961 [id number]  Referring provider: Kerin Perna, NP [address] 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 CT abd from 10/2019 showed no evidence of metastatic disease. Creatinine from 01/30/2020 was 1.65 which improved from 1.8 2 months ago. No flank pain. No significant LUTS. No hematuria. He has chronic back pain which he takes flexeril prn and gabapentin.   His records from [facility] are as follows: <HTML><META HTTP-EQUIV="content-type" CONTENT="text/html;charset=utf-8"><B>12/23/2016: s/p Left nephrectomy for T1b RCC. negative margins. Fuhrman grade 3 <BR><BR>03/24/2018: Creatinine 1.74. Energy good. NO recent CXR <BR><BR>06/23/2018: Creatinine 1.79. CT showed no evidence of metastatic disease. good energy  01/12/2019: CXR showed no evidence of metastatic disease. He has seen cardiology since last visit for chest pain and had a negative stress test. He is now on medication for GERD which has improved his pain  08/10/2019: Creatinine 1.74. CMP normal. Patient did not get CT. He has new onset headaches and is scheduled to see optometry </B>   PMH: Past Medical History:  Diagnosis Date  . Cancer ([place])    Left kidney  . Hyperlipidemia   . Internal hemorrhoids   . Polycystic liver disease   . Substance abuse [facility])     Surgical History: Past Surgical History:  Procedure Laterality Date  . COLONOSCOPY    . HEMORRHOID BANDING    . ROBOT ASSISTED LAPAROSCOPIC NEPHRECTOMY Left 12/07/2017   Procedure: XI ROBOTIC ASSISTED LAPAROSCOPIC NEPHRECTOMY;  Surgeon: Cleon Gustin, MD;  [facility];  Service: Urology;  Laterality: Left;    Home Medications:  Allergies as of 02/01/2020   No Known Allergies     Medication List       Accurate as of February 01, 2020  2:48 PM. If you have any questions, ask your nurse or doctor.        atorvastatin 40 MG  tablet Commonly known as: LIPITOR Take 1 tablet (40 mg total) by mouth daily.   celecoxib 200 MG capsule Commonly known as: CeleBREX Take 1 capsule (200 mg total) by mouth 2 (two) times daily as needed.   cyclobenzaprine 10 MG tablet Commonly known as: FLEXERIL TAKE 1 TABLET(10 MG) BY MOUTH THREE TIMES DAILY AS NEEDED FOR MUSCLE SPASMS   gabapentin 300 MG capsule Commonly known as: NEURONTIN TAKE 1 CAPSULE(300 MG) BY MOUTH TWICE DAILY   nitroGLYCERIN 0.1 mg/hr patch Commonly known as: Nitro-Dur Apply 1/4 patch to affected area 12 hours daily       Allergies: No Known Allergies  Family History: Family History  Problem Relation Age of Onset  . Diabetes Mother   . Hypertension Mother   . Alcohol abuse Mother   . Diabetes Father   . Kidney disease Father   . Diabetes Sister   . Stroke Sister   . Diabetes Paternal Grandmother   . Cancer Paternal Grandfather        type unknown  . Colon cancer Neg Hx     Social History:  reports that he quit smoking about 7 years ago. His smoking use included cigarettes. He has never used smokeless tobacco. He reports current alcohol use. He reports current drug use. Drug: Marijuana.  ROS: All other review of systems were reviewed and are negative except what is noted above in HPI  Physical Exam: BP 125/83   Pulse (!) 59   Temp (!) 96.8  F (36 C)   Ht 5\' 9"  (1.753 m)   Wt 190 lb (86.2 kg)   BMI 28.06 kg/m   Constitutional:  Alert and oriented, No acute distress. HEENT: [place] AT, moist mucus membranes.  Trachea midline, no masses. Cardiovascular: No clubbing, cyanosis, or edema. Respiratory: Normal respiratory effort, no increased work of breathing. GI: Abdomen is soft, nontender, nondistended, no abdominal masses GU: No CVA tenderness Lymph: No cervical or inguinal lymphadenopathy. Skin: No rashes, bruises or suspicious lesions. Neurologic: Grossly intact, no focal deficits, moving all 4 extremities. Psychiatric: Normal mood and  affect.  Laboratory Data: Lab Results  Component Value Date   WBC 5.5 05/08/2018   HGB 17.0 05/08/2018   HCT 50.0 05/08/2018   MCV 91.5 05/08/2018   PLT 329 05/08/2018    Lab Results  Component Value Date   CREATININE 1.65 (H) 01/30/2020    No results found for: PSA  No results found for: TESTOSTERONE  No results found for: HGBA1C  Urinalysis    Component Value Date/Time   COLORURINE YELLOW 02/18/2018 1806   APPEARANCEUR CLEAR 02/18/2018 1806   LABSPEC 1.025 05/08/2018 1417   PHURINE 7.0 05/08/2018 1417   GLUCOSEU NEGATIVE 05/08/2018 1417   HGBUR NEGATIVE 05/08/2018 1417   [place] 05/08/2018 1417   BILIRUBINUR neg 09/21/2017 [place] 05/08/2018 1417   PROTEINUR 100 (A) 05/08/2018 1417   UROBILINOGEN 0.2 05/08/2018 1417   NITRITE NEGATIVE 05/08/2018 1417   LEUKOCYTESUR NEGATIVE 05/08/2018 1417    No results found for: LABMICR, WBCUA, RBCUA, LABEPIT, MUCUS, BACTERIA  Pertinent Imaging: CT abd images and report reviewed and discussed with the patient No results found for this or any previous visit. No results found for this or any previous visit. No results found for this or any previous visit. No results found for this or any previous visit. No results found for this or any previous visit. No results found for this or any previous visit. No results found for this or any previous visit. No results found for this or any previous visit.  Assessment & Plan:    1. Renal cell carcinoma of left kidney (HCC) -RTC 6 months with CXR and CMP   No follow-ups on file.  Nicolette Bang, MD  [facility] [place]

## 2020-02-01 NOTE — Patient Instructions (Signed)

## 2020-03-07 ENCOUNTER — Other Ambulatory Visit: Payer: Self-pay

## 2020-03-07 ENCOUNTER — Encounter (HOSPITAL_COMMUNITY): Payer: Self-pay

## 2020-03-07 ENCOUNTER — Ambulatory Visit (HOSPITAL_COMMUNITY)
Admission: EM | Admit: 2020-03-07 | Discharge: 2020-03-07 | Disposition: A | Discharge status: Home / Self Care | Payer: Medicaid Other | Attending: Urgent Care | Admitting: Urgent Care

## 2020-03-07 DIAGNOSIS — Z87891 Personal history of nicotine dependence: Secondary | ICD-10-CM | POA: Insufficient documentation

## 2020-03-07 DIAGNOSIS — R6889 Other general symptoms and signs: Secondary | ICD-10-CM | POA: Diagnosis present

## 2020-03-07 DIAGNOSIS — R6883 Chills (without fever): Secondary | ICD-10-CM | POA: Diagnosis not present

## 2020-03-07 DIAGNOSIS — E785 Hyperlipidemia, unspecified: Secondary | ICD-10-CM | POA: Insufficient documentation

## 2020-03-07 DIAGNOSIS — U071 COVID-19: Secondary | ICD-10-CM | POA: Insufficient documentation

## 2020-03-07 DIAGNOSIS — Z85528 Personal history of other malignant neoplasm of kidney: Secondary | ICD-10-CM | POA: Diagnosis not present

## 2020-03-07 DIAGNOSIS — Z905 Acquired absence of kidney: Secondary | ICD-10-CM | POA: Diagnosis not present

## 2020-03-07 DIAGNOSIS — R5381 Other malaise: Secondary | ICD-10-CM | POA: Insufficient documentation

## 2020-03-07 DIAGNOSIS — Z79899 Other long term (current) drug therapy: Secondary | ICD-10-CM | POA: Diagnosis not present

## 2020-03-07 DIAGNOSIS — Z8249 Family history of ischemic heart disease and other diseases of the circulatory system: Secondary | ICD-10-CM | POA: Diagnosis not present

## 2020-03-07 DIAGNOSIS — R5383 Other fatigue: Secondary | ICD-10-CM | POA: Insufficient documentation

## 2020-03-07 DIAGNOSIS — Z791 Long term (current) use of non-steroidal anti-inflammatories (NSAID): Secondary | ICD-10-CM | POA: Insufficient documentation

## 2020-03-07 LAB — POCT URINALYSIS DIP (DEVICE)
Glucose, UA: NEGATIVE mg/dL
Leukocytes,Ua: NEGATIVE
Nitrite: NEGATIVE
Protein, ur: >=300 mg/dL — AB
Specific Gravity, Urine: >=1.03 (ref 1.005–1.030)
Urobilinogen, UA: 1 mg/dL (ref 0.0–1.0)
pH: 6 (ref 5.0–8.0)

## 2020-03-07 MED ORDER — BENZONATATE 100 MG PO CAPS: 100.0000 mg | ORAL_CAPSULE | Freq: Three times a day (TID) | ORAL | 0 refills | Status: DC | PRN

## 2020-03-07 MED ORDER — PROMETHAZINE-DM 6.25-15 MG/5ML PO SYRP: 5.0000 mL | ORAL_SOLUTION | Freq: Every evening | ORAL | 0 refills | Status: DC | PRN

## 2020-03-07 NOTE — ED Triage Notes (Signed)
Pt presents with ongoing fatigue & cold sweats for past few months; pt states he had one of his kidneys removed not too long ago but followed up with his doctor after the surgery and thereafter every 6 months.

## 2020-03-07 NOTE — Discharge Instructions (Signed)
We will manage this as a viral syndrome. For sore throat or cough try using a honey-based tea. Use 3 teaspoons of honey with juice squeezed from half lemon. Place shaved pieces of ginger into 1/2-1 cup of water and warm over stove top. Then mix the ingredients and repeat every 4 hours as needed. Please take Tylenol 500mg  every 6 hours. Hydrate very well with at least 2 liters of water. Eat light meals such as soups to replenish electrolytes and soft fruits, veggies. Start an antihistamine like Zyrtec (cetirizine) at 10mg  daily for postnasal drainage, sinus congestion.  You can take this together with pseudoephedrine (Sudafed) at a dose of 60 mg 3 times a day or twice daily as needed for the same kind of congestion.

## 2020-03-07 NOTE — ED Provider Notes (Signed)
[name]   MRN: [id number] DOB: [birth year]  Subjective:   Garrett Clark is a 59 y.o. male presenting for 2-day history of general malaise, subjective fever, cold sweats and chills.  Patient had a mild cough as well.  Reports that he had intermittent left lower groin pain.  Has a history of carcinoma of the left kidney which was surgically removed, nephrectomy of 2018.  No current facility-administered medications for this encounter.  Current Outpatient Medications:  .  atorvastatin (LIPITOR) 40 MG tablet, Take 1 tablet (40 mg total) by mouth daily., Disp: 90 tablet, Rfl: 1 .  celecoxib (CELEBREX) 200 MG capsule, Take 1 capsule (200 mg total) by mouth 2 (two) times daily as needed., Disp: 60 capsule, Rfl: 6 .  cyclobenzaprine (FLEXERIL) 10 MG tablet, TAKE 1 TABLET(10 MG) BY MOUTH THREE TIMES DAILY AS NEEDED FOR MUSCLE SPASMS, Disp: 90 tablet, Rfl: 2 .  gabapentin (NEURONTIN) 300 MG capsule, TAKE 1 CAPSULE(300 MG) BY MOUTH TWICE DAILY, Disp: 60 capsule, Rfl: 3 .  nitroGLYCERIN (NITRO-DUR) 0.1 mg/hr patch, Apply 1/4 patch to affected area 12 hours daily, Disp: 30 patch, Rfl: 3   No Known Allergies  Past Medical History:  Diagnosis Date  . Cancer ([place])    Left kidney  . Hyperlipidemia   . Internal hemorrhoids   . Polycystic liver disease   . Substance abuse [facility])      Past Surgical History:  Procedure Laterality Date  . COLONOSCOPY    . HEMORRHOID BANDING    . ROBOT ASSISTED LAPAROSCOPIC NEPHRECTOMY Left 12/07/2017   Procedure: XI ROBOTIC ASSISTED LAPAROSCOPIC NEPHRECTOMY;  Surgeon: Cleon Gustin, MD;  [facility];  Service: Urology;  Laterality: Left;    Family History  Problem Relation Age of Onset  . Diabetes Mother   . Hypertension Mother   . Alcohol abuse Mother   . Diabetes Father   . Kidney disease Father   . Diabetes Sister   . Stroke Sister   . Diabetes Paternal Grandmother   . Cancer Paternal Grandfather        type unknown  . Colon  cancer Neg Hx     Social History   Tobacco Use  . Smoking status: Former Smoker    Types: Cigarettes    Quit date: 12/03/2012    Years since quitting: 7.2  . Smokeless tobacco: Never Used  Substance Use Topics  . Alcohol use: Yes    Comment: Occasional  . Drug use: Yes    Types: Marijuana    Comment: was addicted to cocaine for 23 yrs but clean now for about  12-13 yrs; last  marijuana  use was this mo rning     ROS   Objective:   Vitals: BP 133/82 (BP Location: Right Arm)   Pulse 85   Temp 98.1 F (36.7 C) (Oral)   Resp 17   SpO2 100%   Physical Exam Constitutional:      General: He is not in acute distress.    Appearance: Normal appearance. He is well-developed. He is not ill-appearing, toxic-appearing or diaphoretic.  HENT:     Head: Normocephalic and atraumatic.     Right Ear: External ear normal.     Left Ear: External ear normal.     Nose: Nose normal.     Mouth/Throat:     Mouth: Mucous membranes are moist.     Pharynx: Oropharynx is clear.  Eyes:     General: No scleral icterus.    Extraocular  Movements: Extraocular movements intact.     Pupils: Pupils are equal, round, and reactive to light.  Cardiovascular:     Rate and Rhythm: Normal rate and regular rhythm.     Heart sounds: Normal heart sounds. No murmur. No friction rub. No gallop.   Pulmonary:     Effort: Pulmonary effort is normal. No respiratory distress.     Breath sounds: Normal breath sounds. No stridor. No wheezing, rhonchi or rales.  Abdominal:     General: Bowel sounds are normal. There is no distension.     Palpations: Abdomen is soft. There is no mass.     Tenderness: There is no abdominal tenderness. There is no guarding or rebound.  Skin:    General: Skin is warm and dry.  Neurological:     Mental Status: He is alert and oriented to person, place, and time.  Psychiatric:        Mood and Affect: Mood normal.        Behavior: Behavior normal.        Thought Content: Thought  content normal.        Judgment: Judgment normal.     Results for orders placed or performed during the hospital encounter of 03/07/20 (from the past 24 hour(s))  POCT urinalysis dip (device)     Status: Abnormal   Collection Time: 03/07/20 12:04 PM  Result Value Ref Range   Glucose, UA NEGATIVE NEGATIVE mg/dL   Bilirubin Urine SMALL (A) NEGATIVE   Ketones, ur TRACE (A) NEGATIVE mg/dL   Specific Gravity, Urine >=1.030 1.005 - 1.030   Hgb urine dipstick SMALL (A) NEGATIVE   pH 6.0 5.0 - 8.0   Protein, ur >=300 (A) NEGATIVE mg/dL   Urobilinogen, UA 1.0 0.0 - 1.0 mg/dL   Nitrite NEGATIVE NEGATIVE   Leukocytes,Ua NEGATIVE NEGATIVE    Assessment and Plan :   1. Chills   2. Cold sweat   3. Fatigue, unspecified type   4. Malaise     Will manage for viral illness such as viral URI, viral rhinitis, possible COVID-19. Counseled patient on nature of COVID-19 including modes of transmission, diagnostic testing, management and supportive care.  Offered symptomatic relief. COVID 19 testing is pending. Counseled patient on potential for adverse effects with medications prescribed/recommended today, ER and return-to-clinic precautions discussed, patient verbalized understanding.     Jaynee Eagles, PA-C 03/07/20 1344

## 2020-03-08 ENCOUNTER — Telehealth (HOSPITAL_COMMUNITY): Payer: Self-pay

## 2020-03-08 LAB — URINE CULTURE: Culture: NO GROWTH

## 2020-03-08 LAB — SARS CORONAVIRUS 2 (TAT 6-24 HRS): SARS Coronavirus 2: POSITIVE — AB

## 2020-03-08 NOTE — Telephone Encounter (Signed)
Informed pt. Of his +covid-19 results.  Spoke with pt. To self-isolate for 10 days from the onset of symptoms and to be 24 hrs free of fever without fever-reducing medications and symptoms are improving to return to the community/work.  Discussed s/s of sob, chest pain, difficulty breathing, any symptoms that are concerning to return to the ED for evaluation and inform staff of + Covid status.  Discussed with pt. To inform anyone he has had contact with.   Inform  Your PCP of results.  May use OTC medications for fever.  Continue to wear a mask, hand washing and social distancing.   All questions answered.  Pt. Verbalized understanding.

## 2020-03-09 ENCOUNTER — Other Ambulatory Visit: Payer: Self-pay | Admitting: Family Medicine

## 2020-03-09 DIAGNOSIS — R202 Paresthesia of skin: Secondary | ICD-10-CM

## 2020-03-09 DIAGNOSIS — M79601 Pain in right arm: Secondary | ICD-10-CM

## 2020-03-09 MED ORDER — GABAPENTIN 300 MG PO CAPS: ORAL_CAPSULE | ORAL | 3 refills | Status: DC

## 2020-03-17 ENCOUNTER — Other Ambulatory Visit: Payer: Self-pay

## 2020-03-17 ENCOUNTER — Ambulatory Visit (HOSPITAL_COMMUNITY)
Admission: EM | Admit: 2020-03-17 | Discharge: 2020-03-17 | Disposition: A | Discharge status: Home / Self Care | Payer: Medicaid Other

## 2020-03-17 NOTE — ED Triage Notes (Signed)
Pt registered in error.  Pt denies any concerns he wishes to see provider for.   States no fever in past 3 days, without the use of Tyl or IBU; denies any respiratory sxs; states feeling better. Discussed with Dr Mannie Stabile.  Work note will be provided.

## 2020-04-20 ENCOUNTER — Other Ambulatory Visit: Payer: Self-pay

## 2020-04-20 ENCOUNTER — Telehealth (INDEPENDENT_AMBULATORY_CARE_PROVIDER_SITE_OTHER): Payer: Self-pay

## 2020-04-20 MED ORDER — CYCLOBENZAPRINE HCL 10 MG PO TABS: ORAL_TABLET | ORAL | 2 refills | Status: DC

## 2020-04-20 NOTE — Telephone Encounter (Signed)
Medication has been refilled.

## 2020-04-20 NOTE — Telephone Encounter (Signed)
Patient called to make a medication refill for   cyclobenzaprine (FLEXERIL) 10 MG tablet    Patient uses   [facility] [phone] - [place], [place]   Please advice [phone]

## 2020-05-01 DIAGNOSIS — Z23 Encounter for immunization: Secondary | ICD-10-CM | POA: Diagnosis not present

## 2020-05-29 DIAGNOSIS — Z23 Encounter for immunization: Secondary | ICD-10-CM | POA: Diagnosis not present

## 2020-06-26 ENCOUNTER — Other Ambulatory Visit (INDEPENDENT_AMBULATORY_CARE_PROVIDER_SITE_OTHER): Payer: Self-pay | Admitting: Primary Care

## 2020-06-26 DIAGNOSIS — E7841 Elevated Lipoprotein(a): Secondary | ICD-10-CM

## 2020-07-02 ENCOUNTER — Telehealth (INDEPENDENT_AMBULATORY_CARE_PROVIDER_SITE_OTHER): Payer: Medicaid Other | Admitting: Primary Care

## 2020-07-13 ENCOUNTER — Emergency Department (HOSPITAL_COMMUNITY)
Admission: EM | Admit: 2020-07-13 | Discharge: 2020-07-13 | Disposition: A | Discharge status: Home / Self Care | Payer: Medicaid Other | Attending: Emergency Medicine | Admitting: Emergency Medicine

## 2020-07-13 ENCOUNTER — Encounter (HOSPITAL_COMMUNITY): Payer: Self-pay | Admitting: Emergency Medicine

## 2020-07-13 ENCOUNTER — Encounter (HOSPITAL_COMMUNITY): Payer: Self-pay

## 2020-07-13 ENCOUNTER — Ambulatory Visit (HOSPITAL_COMMUNITY)
Admission: EM | Admit: 2020-07-13 | Discharge: 2020-07-13 | Disposition: A | Discharge status: Home / Self Care | Payer: Medicaid Other

## 2020-07-13 ENCOUNTER — Emergency Department (HOSPITAL_COMMUNITY): Payer: Medicaid Other

## 2020-07-13 DIAGNOSIS — G510 Bell's palsy: Secondary | ICD-10-CM | POA: Insufficient documentation

## 2020-07-13 DIAGNOSIS — Z87891 Personal history of nicotine dependence: Secondary | ICD-10-CM | POA: Insufficient documentation

## 2020-07-13 DIAGNOSIS — Z85528 Personal history of other malignant neoplasm of kidney: Secondary | ICD-10-CM | POA: Diagnosis not present

## 2020-07-13 DIAGNOSIS — R29810 Facial weakness: Secondary | ICD-10-CM | POA: Diagnosis present

## 2020-07-13 LAB — URINALYSIS, ROUTINE W REFLEX MICROSCOPIC
Bilirubin Urine: NEGATIVE
Glucose, UA: NEGATIVE mg/dL
Hgb urine dipstick: NEGATIVE
Ketones, ur: NEGATIVE mg/dL
Leukocytes,Ua: NEGATIVE
Nitrite: NEGATIVE
Protein, ur: NEGATIVE mg/dL
Specific Gravity, Urine: 1.021 (ref 1.005–1.030)
pH: 6 (ref 5.0–8.0)

## 2020-07-13 LAB — CBC
HCT: 40.9 % (ref 39.0–52.0)
Hemoglobin: 13.3 g/dL (ref 13.0–17.0)
MCH: 30.9 pg (ref 26.0–34.0)
MCHC: 32.5 g/dL (ref 30.0–36.0)
MCV: 95.1 fL (ref 80.0–100.0)
Platelets: 296 10*3/uL (ref 150–400)
RBC: 4.3 MIL/uL (ref 4.22–5.81)
RDW: 13.8 % (ref 11.5–15.5)
WBC: 4.9 10*3/uL (ref 4.0–10.5)
nRBC: 0 % (ref 0.0–0.2)

## 2020-07-13 LAB — COMPREHENSIVE METABOLIC PANEL
ALT: 20 U/L (ref 0–44)
AST: 23 U/L (ref 15–41)
Albumin: 3.8 g/dL (ref 3.5–5.0)
Alkaline Phosphatase: 103 U/L (ref 38–126)
Anion gap: 10 (ref 5–15)
BUN: 20 mg/dL (ref 6–20)
CO2: 22 mmol/L (ref 22–32)
Calcium: 8.9 mg/dL (ref 8.9–10.3)
Chloride: 107 mmol/L (ref 98–111)
Creatinine, Ser: 1.64 mg/dL — ABNORMAL HIGH (ref 0.61–1.24)
GFR calc Af Amer: 53 mL/min — ABNORMAL LOW (ref >60)
GFR calc non Af Amer: 46 mL/min — ABNORMAL LOW (ref >60)
Glucose, Bld: 124 mg/dL — ABNORMAL HIGH (ref 70–99)
Potassium: 4.1 mmol/L (ref 3.5–5.1)
Sodium: 139 mmol/L (ref 135–145)
Total Bilirubin: 0.8 mg/dL (ref 0.3–1.2)
Total Protein: 7.3 g/dL (ref 6.5–8.1)

## 2020-07-13 LAB — RAPID URINE DRUG SCREEN, HOSP PERFORMED
Amphetamines: NOT DETECTED
Barbiturates: NOT DETECTED
Benzodiazepines: NOT DETECTED
Cocaine: POSITIVE — AB
Opiates: NOT DETECTED
Tetrahydrocannabinol: POSITIVE — AB

## 2020-07-13 LAB — DIFFERENTIAL
Abs Immature Granulocytes: 0.01 10*3/uL (ref 0.00–0.07)
Basophils Absolute: 0 10*3/uL (ref 0.0–0.1)
Basophils Relative: 1 %
Eosinophils Absolute: 0.4 10*3/uL (ref 0.0–0.5)
Eosinophils Relative: 8 %
Immature Granulocytes: 0 %
Lymphocytes Relative: 40 %
Lymphs Abs: 1.9 10*3/uL (ref 0.7–4.0)
Monocytes Absolute: 0.5 10*3/uL (ref 0.1–1.0)
Monocytes Relative: 9 %
Neutro Abs: 2.1 10*3/uL (ref 1.7–7.7)
Neutrophils Relative %: 42 %

## 2020-07-13 LAB — I-STAT CHEM 8, ED
BUN: 22 mg/dL — ABNORMAL HIGH (ref 6–20)
Calcium, Ion: 1.1 mmol/L — ABNORMAL LOW (ref 1.15–1.40)
Chloride: 107 mmol/L (ref 98–111)
Creatinine, Ser: 1.7 mg/dL — ABNORMAL HIGH (ref 0.61–1.24)
Glucose, Bld: 122 mg/dL — ABNORMAL HIGH (ref 70–99)
HCT: 40 % (ref 39.0–52.0)
Hemoglobin: 13.6 g/dL (ref 13.0–17.0)
Potassium: 3.9 mmol/L (ref 3.5–5.1)
Sodium: 142 mmol/L (ref 135–145)
TCO2: 21 mmol/L — ABNORMAL LOW (ref 22–32)

## 2020-07-13 LAB — PROTIME-INR
INR: 1 (ref 0.8–1.2)
Prothrombin Time: 12.3 seconds (ref 11.4–15.2)

## 2020-07-13 LAB — ETHANOL: Alcohol, Ethyl (B): <10 mg/dL (ref <10)

## 2020-07-13 LAB — APTT: aPTT: 32 seconds (ref 24–36)

## 2020-07-13 MED ORDER — METOCLOPRAMIDE HCL 5 MG/ML IJ SOLN
10.0000 mg | Freq: Once | INTRAMUSCULAR | Status: AC
  Administered 2020-07-13: 10 mg via INTRAVENOUS
  Filled 2020-07-13: qty 2

## 2020-07-13 MED ORDER — VALACYCLOVIR HCL 500 MG PO TABS
1000.0000 mg | ORAL_TABLET | Freq: Once | ORAL | Status: AC
  Administered 2020-07-13: 1000 mg via ORAL
  Filled 2020-07-13: qty 2

## 2020-07-13 MED ORDER — SODIUM CHLORIDE 0.9 % IV BOLUS
1000.0000 mL | Freq: Once | INTRAVENOUS | Status: AC
  Administered 2020-07-13: 1000 mL via INTRAVENOUS

## 2020-07-13 MED ORDER — VALACYCLOVIR HCL 1 G PO TABS: 1000.0000 mg | ORAL_TABLET | Freq: Three times a day (TID) | ORAL | 0 refills | Status: AC

## 2020-07-13 MED ORDER — PREDNISONE 20 MG PO TABS
60.0000 mg | ORAL_TABLET | Freq: Once | ORAL | Status: AC
  Administered 2020-07-13: 60 mg via ORAL
  Filled 2020-07-13: qty 3

## 2020-07-13 MED ORDER — PREDNISONE 20 MG PO TABS: 60.0000 mg | ORAL_TABLET | Freq: Every day | ORAL | 0 refills | Status: AC

## 2020-07-13 NOTE — ED Notes (Signed)
Patient is being discharged from the Urgent Care and sent to the Emergency Department via [place] . Per Lysle Morales, MD, patient is in need of higher level of care due to possible stroke (left sided facial droop and weakness). Patient is aware and verbalizes understanding of plan of care.  Vitals:   07/13/20 1838  BP: (!) 138/80  Pulse: 96  Resp: 15  Temp: 98.4 F (36.9 C)  SpO2: 100%

## 2020-07-13 NOTE — Discharge Instructions (Addendum)
Please pick up medication and take as prescribed. You can also pick up artificial tear eyedrops OTC to help moisten your left eye as you may have some difficulty closing it Follow up with your PCP regarding your ED visit today Return to the ED for any worsening symptoms

## 2020-07-13 NOTE — ED Provider Notes (Signed)
[place] EMERGENCY DEPARTMENT Provider Note   CSN: [id number] Arrival date & time: 07/13/20  1856     History Chief Complaint  Patient presents with  . Stroke Symptoms    Garrett Clark is a 59 y.o. male with PMHx hyperlipidemia who presents to the ED today with complaint of stroke like symptoms.  Patient was brought in by [facility] from urgent care with complaint of left-sided facial droop with last known normal 8 AM this morning.  Code stroke was initiated however canceled at bridge by neurologist Dr. Leonel Ramsay.  Patient's only complaint is left-sided facial droop which appears consistent with Bell's palsy.  He states he woke up this morning and was eating his breakfast when he noticed that he was eating strangely out of the left side of his mouth however did not think much of it and went to work.  Patient states he went home after work and took a nap and then later went to visit family who noticed a left-sided facial droop and told him he needs to be evaluated.  Patient states he is having a mild headache at this time however states he normally gets headaches and this does not feel any different than normal.  He denies weakness/numbness/tingling unilaterally, speech changes, confusion, severe sudden onset headache, vision changes, any other associated symptoms.  The history is provided by the patient and medical records.       Past Medical History:  Diagnosis Date  . Cancer ([place])    Left kidney  . Hyperlipidemia   . Internal hemorrhoids   . Polycystic liver disease   . Substance abuse [facility])     Patient Active Problem List   Diagnosis Date Noted  . GERD (gastroesophageal reflux disease) 01/13/2019  . History of left radical nephrectomy 01/13/2019  . Renal cell carcinoma of left kidney ([place]) 12/07/2017  . Prolapsed internal hemorrhoids, grade 2 06/19/2017  . Pure hypercholesterolemia 03/30/2017  . History of cocaine abuse ([place]) 03/23/2017    Past  Surgical History:  Procedure Laterality Date  . COLONOSCOPY    . HEMORRHOID BANDING    . ROBOT ASSISTED LAPAROSCOPIC NEPHRECTOMY Left 12/07/2017   Procedure: XI ROBOTIC ASSISTED LAPAROSCOPIC NEPHRECTOMY;  Surgeon: Cleon Gustin, MD;  Location: [facility];  Service: Urology;  Laterality: Left;       Family History  Problem Relation Age of Onset  . Diabetes Mother   . Hypertension Mother   . Alcohol abuse Mother   . Diabetes Father   . Kidney disease Father   . Diabetes Sister   . Stroke Sister   . Diabetes Paternal Grandmother   . Cancer Paternal Grandfather        type unknown  . Colon cancer Neg Hx     Social History   Tobacco Use  . Smoking status: Former Smoker    Types: Cigarettes    Quit date: 12/03/2012    Years since quitting: 7.6  . Smokeless tobacco: Never Used  Vaping Use  . Vaping Use: Never used  Substance Use Topics  . Alcohol use: Yes    Comment: Occasional  . Drug use: Yes    Types: Marijuana    Comment: was addicted to cocaine for 23 yrs but clean now for about  12-13 yrs; last  marijuana  use was this mo rning     Home Medications Prior to Admission medications   Medication Sig Start Date End Date Taking? Authorizing Provider  acetaminophen (TYLENOL) 500 MG tablet Take  1,000 mg by mouth every 6 (six) hours as needed for mild pain or headache.   Yes [provider]  atorvastatin (LIPITOR) 40 MG tablet TAKE 1 TABLET(40 MG) BY MOUTH DAILY Patient taking differently: Take 40 mg by mouth daily.  06/26/20  Yes Kerin Perna, NP  cyclobenzaprine (FLEXERIL) 10 MG tablet TAKE 1 TABLET(10 MG) BY MOUTH THREE TIMES DAILY AS NEEDED FOR MUSCLE SPASMS Patient taking differently: Take 10 mg by mouth 3 (three) times daily as needed for muscle spasms.  04/20/20  Yes Kerin Perna, NP  gabapentin (NEURONTIN) 300 MG capsule TAKE 1 CAPSULE(300 MG) BY MOUTH TWICE DAILY Patient taking differently: Take 300 mg by mouth 2 (two) times daily.  03/09/20   Yes Charlott Rakes, MD  benzonatate (TESSALON) 100 MG capsule Take 1-2 capsules (100-200 mg total) by mouth 3 (three) times daily as needed. Patient not taking: Reported on 07/13/2020 03/07/20   Jaynee Eagles, PA-C  celecoxib (CELEBREX) 200 MG capsule Take 1 capsule (200 mg total) by mouth 2 (two) times daily as needed. Patient not taking: Reported on 07/13/2020 11/21/19   Hilts, Legrand Como, MD  nitroGLYCERIN (NITRO-DUR) 0.1 mg/hr patch Apply 1/4 patch to affected area 12 hours daily Patient not taking: Reported on 07/13/2020 11/21/19   Hilts, Legrand Como, MD  predniSONE (DELTASONE) 20 MG tablet Take 3 tablets (60 mg total) by mouth daily with breakfast for 10 days. 07/13/20 07/23/20  Eustaquio Maize, PA-C  promethazine-dextromethorphan (PROMETHAZINE-DM) 6.25-15 MG/5ML syrup Take 5 mLs by mouth at bedtime as needed for cough. Patient not taking: Reported on 07/13/2020 03/07/20   Jaynee Eagles, PA-C  valACYclovir (VALTREX) 1000 MG tablet Take 1 tablet (1,000 mg total) by mouth 3 (three) times daily for 10 days. 07/13/20 07/23/20  Eustaquio Maize, PA-C    Allergies    Patient has no known allergies.  Review of Systems   Review of Systems  Constitutional: Negative for chills and fever.  Eyes: Negative for visual disturbance.  Neurological: Positive for facial asymmetry and headaches. Negative for speech difficulty, weakness and numbness.  All other systems reviewed and are negative.   Physical Exam Updated Vital Signs BP (!) 129/81   Pulse 81   Resp 21   SpO2 98%   Physical Exam Vitals and nursing note reviewed.  Constitutional:      Appearance: He is not ill-appearing or diaphoretic.  HENT:     Head: Normocephalic and atraumatic.     Right Ear: Tympanic membrane normal.     Left Ear: Tympanic membrane normal.  Eyes:     Extraocular Movements: Extraocular movements intact.     Conjunctiva/sclera: Conjunctivae normal.     Pupils: Pupils are equal, round, and reactive to light.  Cardiovascular:      Rate and Rhythm: Normal rate and regular rhythm.     Pulses: Normal pulses.  Pulmonary:     Effort: Pulmonary effort is normal.     Breath sounds: Normal breath sounds. No wheezing, rhonchi or rales.  Abdominal:     Palpations: Abdomen is soft.     Tenderness: There is no abdominal tenderness.  Musculoskeletal:     Cervical back: Neck supple.  Skin:    General: Skin is warm and dry.  Neurological:     Mental Status: He is alert.     Comments: Left sided facial droop present with CN VII deficit; unable to lift left eyebrow A&O x4 GCS 15 Sensation and strength intact Gait nonataxic including with tandem walking Coordination with finger-to-nose WNL  Neg romberg, neg pronator drift     ED Results / Procedures / Treatments   Labs (all labs ordered are listed, but only abnormal results are displayed) Labs Reviewed  COMPREHENSIVE METABOLIC PANEL - Abnormal; Notable for the following components:      Result Value   Glucose, Bld 124 (*)    Creatinine, Ser 1.64 (*)    GFR calc non Af Amer 46 (*)    GFR calc Af Amer 53 (*)    All other components within normal limits  RAPID URINE DRUG SCREEN, HOSP PERFORMED - Abnormal; Notable for the following components:   Cocaine POSITIVE (*)    Tetrahydrocannabinol POSITIVE (*)    All other components within normal limits  I-STAT CHEM 8, ED - Abnormal; Notable for the following components:   BUN 22 (*)    Creatinine, Ser 1.70 (*)    Glucose, Bld 122 (*)    Calcium, Ion 1.10 (*)    TCO2 21 (*)    All other components within normal limits  ETHANOL  PROTIME-INR  APTT  CBC  DIFFERENTIAL  URINALYSIS, ROUTINE W REFLEX MICROSCOPIC    EKG EKG Interpretation  Date/Time:  Friday July 13 2020 19:17:30 EDT Ventricular Rate:  69 PR Interval:    QRS Duration: 80 QT Interval:  368 QTC Calculation: 395 R Axis:   6 Text Interpretation: Sinus rhythm Atrial premature complex Abnormal R-wave progression, early transition since last tracing no  significant change Confirmed by Malvin Johns [phone]) on 07/13/2020 7:56:07 PM   Radiology CT HEAD CODE STROKE WO CONTRAST  Result Date: 07/13/2020 CLINICAL DATA:  Code stroke. Neuro deficit, acute stroke suspected. Left facial numbness and facial droop. EXAM: CT HEAD WITHOUT CONTRAST TECHNIQUE: Contiguous axial images were obtained from the base of the skull through the vertex without intravenous contrast. COMPARISON:  None. FINDINGS: Brain: No acute infarct, hemorrhage, or mass lesion is present. No significant white matter lesions are present. The ventricles are of normal size. No significant extraaxial fluid collection is present. The brainstem and cerebellum are within normal limits. Vascular: No hyperdense vessel or unexpected calcification. Skull: Calvarium is intact. No focal lytic or blastic lesions are present. No significant extracranial soft tissue lesion is present. Sinuses/Orbits: The paranasal sinuses and mastoid air cells are clear. The globes and orbits are within normal limits. ASPECTS [facility] Stroke Program Early CT Score) - Ganglionic level infarction (caudate, lentiform nuclei, internal capsule, insula, M1-M3 cortex): 7/7 - Supraganglionic infarction (M4-M6 cortex): 3/3 Total score (0-10 with 10 being normal): 10/10 IMPRESSION: 1. Normal CT of the head without contrast 2. ASPECTS is 10/10 The above was relayed via text pager to Dr. Leonel Ramsay on 07/13/2020 at 19:13 . Electronically Signed   By: San Morelle M.D.   On: 07/13/2020 19:13    Procedures Procedures (including critical care time)  Medications Ordered in ED Medications  valACYclovir (VALTREX) tablet 1,000 mg (has no administration in time range)  predniSONE (DELTASONE) tablet 60 mg (has no administration in time range)  sodium chloride 0.9 % bolus 1,000 mL (1,000 mLs Intravenous New Bag/Given 07/13/20 1959)  metoCLOPramide (REGLAN) injection 10 mg (10 mg Intravenous Given 07/13/20 2001)    ED Course  I have  reviewed the triage vital signs and the nursing notes.  Pertinent labs & imaging results that were available during my care of the patient were reviewed by me and considered in my medical decision making (see chart for details).    MDM Rules/Calculators/A&P  59 year old male who presented to the ED with strokelike symptoms, code stroke was initially initiated however canceled as per HPI neurologist Dr. Leonel Ramsay with symptoms consistent with Bell's palsy.  Last known normal around 8 AM this morning.  Patient's only complaint is left-sided facial droop.  He is noted to have cranial nerve VII deficit on exam, unable to lift his left eyebrow with noted facial droop.  He had initially complained of some decreased sensation on his left side and therefore he was taken to CT scan however when he returned a deeper investigation was done and patient does deny this currently.  Dr. Leonel Ramsay does not recommend further imaging at this time.  Will treat with prednisone and valacyclovir, will check kidney function prior.  Patient does have a complaint of a headache, has history of same.  Reglan and fluids provided.   CT negative.   On reevaluation pt resting comfortably asleep in darkened room; easily arousable. Reports headache has improved. Will discharge home at this time. Have given dose of valacyclovir and prednisone here in the ED for tonight. Pt instructed to follow up with his PCP. He is in agreement with plan and stable for discharge home.   This note was prepared using Dragon voice recognition software and may include unintentional dictation errors due to the inherent limitations of voice recognition software.   Final Clinical Impression(s) / ED Diagnoses Final diagnoses:  Bell's palsy    Rx / DC Orders ED Discharge Orders         Ordered    valACYclovir (VALTREX) 1000 MG tablet  3 times daily     Discontinue  Reprint     07/13/20 2118    predniSONE (DELTASONE) 20 MG  tablet  Daily with breakfast     Discontinue  Reprint     07/13/20 2118           Discharge Instructions     Please pick up medication and take as prescribed. You can also pick up artificial tear eyedrops OTC to help moisten your left eye as you may have some difficulty closing it Follow up with your PCP regarding your ED visit today Return to the ED for any worsening symptoms       Eustaquio Maize, PA-C 07/13/20 2121    Malvin Johns, MD 07/17/20 1021

## 2020-07-13 NOTE — ED Notes (Signed)
Spoke with ER charge RN, spoke with [facility] for stroke activation, 911 notified of emergent EMS transport need.

## 2020-07-13 NOTE — ED Triage Notes (Signed)
Pt bib [facility] for code stroke w/ c/o L sided facial droop, code stroke. LKW 0800. Code stroke cancelled at bridge by Dr. Leonel Ramsay.

## 2020-07-13 NOTE — ED Triage Notes (Signed)
Pt states he noticed this morning when eating breakfast that he was having trouble eating and his face seemed to be drooping. He has numbness and a headache on the left side of his head.

## 2020-07-13 NOTE — Consult Note (Addendum)
Neurology Consultation Reason for Consult: Facial weakness Referring Physician: Roxine Caddy  CC: Facial weakness  History is obtained from: Chart review  HPI: Garrett Clark is a 59 y.o. male with a history of kidney cancer status post nephrectomy who presents with left-sided facial weakness that started this morning.  He states that it is gotten progressively worse over the course of the day.  He denies any involvement of his arms, legs, double vision, or any other symptoms.  Due to this, he sought care at an urgent care, where he was activated as a code stroke and transported emergently to [facility].  He does also complain of some headache with radiation to behind his ear.  LKW: 5 AM tpa given?: no, outside of window    ROS: A 14 point ROS was performed and is negative except as noted in the HPI.   Past Medical History:  Diagnosis Date  . Cancer ([place])    Left kidney  . Hyperlipidemia   . Internal hemorrhoids   . Polycystic liver disease   . Substance abuse ([place])      Family History  Problem Relation Age of Onset  . Diabetes Mother   . Hypertension Mother   . Alcohol abuse Mother   . Diabetes Father   . Kidney disease Father   . Diabetes Sister   . Stroke Sister   . Diabetes Paternal Grandmother   . Cancer Paternal Grandfather        type unknown  . Colon cancer Neg Hx      Social History:  reports that he quit smoking about 7 years ago. His smoking use included cigarettes. He has never used smokeless tobacco. He reports current alcohol use. He reports current drug use. Drug: Marijuana.   Exam: Current vital signs: BP 121/74 (BP Location: Left Arm)   Pulse 74   Temp (!) 97.5 F (36.4 C) (Oral)   Resp 16   SpO2 100%  Vital signs in last 24 hours: Temp:  [97.5 F (36.4 C)-98.4 F (36.9 C)] 97.5 F (36.4 C) (07/23 2127) Pulse Rate:  [63-96] 74 (07/23 2127) Resp:  [15-21] 16 (07/23 2127) BP: (121-138)/(74-81) 121/74 (07/23 2127) SpO2:  [96  %-100 %] 100 % (07/23 2127)   Physical Exam  Constitutional: Appears well-developed and well-nourished.  Psych: Affect appropriate to situation Eyes: No scleral injection HENT: No OP obstrucion MSK: no joint deformities.  Cardiovascular: Normal rate and regular rhythm.  Respiratory: Effort normal, non-labored breathing GI: Soft.  No distension. There is no tenderness.  Skin: WDI  Neuro: Mental Status: Patient is awake, alert, oriented to person, place, month, year, and situation. Patient is able to give a clear and coherent history. No signs of aphasia or neglect Cranial Nerves: II: Visual Fields are full. Pupils are equal, round, and reactive to light.   III,IV, VI: EOMI without ptosis or diploplia.  V: Facial sensation is symmetric to temperature VII: Facial movement is weak on the left involving both the face and forehead VIII: hearing is intact to voice X: Uvula elevates symmetrically XI: Shoulder shrug is symmetric. XII: tongue is midline without atrophy or fasciculations.  Motor: Tone is normal. Bulk is normal. 5/5 strength was present in all four extremities.  Sensory: Sensation is symmetric to light touch and temperature in the arms and legs. Deep Tendon Reflexes: 2+ and symmetric in the biceps and patellae.  Cerebellar: FNF and HKS are intact bilaterally      I have reviewed labs in  epic and the results pertinent to this consultation are: Normal sodium, creatinine 1.6  I have reviewed the images obtained: CT head-negative  Impression: 59 year old male with Bell's palsy.  No further work-up is needed.  Recommendations: 1) initially recommended CT head, which is performed at the time of finalizing this note  2) I would treat with Valtrex and steroids. He was initially give a prescription for valtrex 1 g tID, but with his renal function, I would do 1 g BID. I called and informed the patient to only take it BID.    Roland Rack, MD [facility] [phone]  If 7pm- 7am, please page neurology on call as listed in [place].

## 2020-07-25 ENCOUNTER — Ambulatory Visit (INDEPENDENT_AMBULATORY_CARE_PROVIDER_SITE_OTHER): Payer: Medicaid Other | Admitting: Primary Care

## 2020-07-25 ENCOUNTER — Encounter (INDEPENDENT_AMBULATORY_CARE_PROVIDER_SITE_OTHER): Payer: Self-pay | Admitting: Primary Care

## 2020-07-25 ENCOUNTER — Other Ambulatory Visit: Payer: Self-pay

## 2020-07-25 VITALS — BP 158/98 | HR 86 | Temp 97.9°F | Ht 69.0 in | Wt 204.8 lb

## 2020-07-25 DIAGNOSIS — E7800 Pure hypercholesterolemia, unspecified: Secondary | ICD-10-CM

## 2020-07-25 DIAGNOSIS — Z0000 Encounter for general adult medical examination without abnormal findings: Secondary | ICD-10-CM

## 2020-07-25 DIAGNOSIS — K219 Gastro-esophageal reflux disease without esophagitis: Secondary | ICD-10-CM | POA: Diagnosis not present

## 2020-07-25 DIAGNOSIS — Z905 Acquired absence of kidney: Secondary | ICD-10-CM

## 2020-07-25 DIAGNOSIS — R030 Elevated blood-pressure reading, without diagnosis of hypertension: Secondary | ICD-10-CM

## 2020-07-25 NOTE — Progress Notes (Signed)
Subjective:    [name] is a 59 y.o. male who presents for Medicare Annual/Subsequent preventive examination.Mr. .  His blood pressure is elevated and has a headache.  Asked what did he eat today stated ate pancakes and a sausage patty for lunch she ate a peanut butter and a sandwich.  States he takes his medication every morning. Denies shortness of breath, chest pain or lower extremity edema.   Preventive Screening-Counseling & Management  Tobacco Social History   Tobacco Use  Smoking Status Former Smoker  . Types: Cigarettes  . Quit date: 12/03/2012  . Years since quitting: 7.6  Smokeless Tobacco Never Used   Current Problems (verified) Patient Active Problem List   Diagnosis Date Noted  . GERD (gastroesophageal reflux disease) 01/13/2019  . History of left radical nephrectomy 01/13/2019  . Renal cell carcinoma of left kidney ([place]) 12/07/2017  . Prolapsed internal hemorrhoids, grade 2 06/19/2017  . Pure hypercholesterolemia 03/30/2017  . History of cocaine abuse ([place]) 03/23/2017    Medications Prior to Visit Current Outpatient Medications on File Prior to Visit  Medication Sig Dispense Refill  . acetaminophen (TYLENOL) 500 MG tablet Take 1,000 mg by mouth every 6 (six) hours as needed for mild pain or headache.    Marland Kitchen atorvastatin (LIPITOR) 40 MG tablet TAKE 1 TABLET(40 MG) BY MOUTH DAILY (Patient taking differently: Take 40 mg by mouth daily. ) 90 tablet 0  . benzonatate (TESSALON) 100 MG capsule Take 1-2 capsules (100-200 mg total) by mouth 3 (three) times daily as needed. 60 capsule 0  . celecoxib (CELEBREX) 200 MG capsule Take 1 capsule (200 mg total) by mouth 2 (two) times daily as needed. 60 capsule 6  . cyclobenzaprine (FLEXERIL) 10 MG tablet TAKE 1 TABLET(10 MG) BY MOUTH THREE TIMES DAILY AS NEEDED FOR MUSCLE SPASMS (Patient taking differently: Take 10 mg by mouth 3 (three) times daily as needed for muscle spasms. ) 90 tablet 2  . gabapentin (NEURONTIN) 300 MG  capsule TAKE 1 CAPSULE(300 MG) BY MOUTH TWICE DAILY (Patient taking differently: Take 300 mg by mouth 2 (two) times daily. ) 60 capsule 3  . nitroGLYCERIN (NITRO-DUR) 0.1 mg/hr patch Apply 1/4 patch to affected area 12 hours daily (Patient not taking: Reported on 07/13/2020) 30 patch 3   No current facility-administered medications on file prior to visit.    Current Medications (verified) Current Outpatient Medications  Medication Sig Dispense Refill  . acetaminophen (TYLENOL) 500 MG tablet Take 1,000 mg by mouth every 6 (six) hours as needed for mild pain or headache.    Marland Kitchen atorvastatin (LIPITOR) 40 MG tablet TAKE 1 TABLET(40 MG) BY MOUTH DAILY (Patient taking differently: Take 40 mg by mouth daily. ) 90 tablet 0  . benzonatate (TESSALON) 100 MG capsule Take 1-2 capsules (100-200 mg total) by mouth 3 (three) times daily as needed. 60 capsule 0  . celecoxib (CELEBREX) 200 MG capsule Take 1 capsule (200 mg total) by mouth 2 (two) times daily as needed. 60 capsule 6  . cyclobenzaprine (FLEXERIL) 10 MG tablet TAKE 1 TABLET(10 MG) BY MOUTH THREE TIMES DAILY AS NEEDED FOR MUSCLE SPASMS (Patient taking differently: Take 10 mg by mouth 3 (three) times daily as needed for muscle spasms. ) 90 tablet 2  . gabapentin (NEURONTIN) 300 MG capsule TAKE 1 CAPSULE(300 MG) BY MOUTH TWICE DAILY (Patient taking differently: Take 300 mg by mouth 2 (two) times daily. ) 60 capsule 3  . nitroGLYCERIN (NITRO-DUR) 0.1 mg/hr patch Apply 1/4 patch to  affected area 12 hours daily (Patient not taking: Reported on 07/13/2020) 30 patch 3   No current facility-administered medications for this visit.     Allergies (verified) Patient has no known allergies.   PAST HISTORY  Family History Family History  Problem Relation Age of Onset  . Diabetes Mother   . Hypertension Mother   . Alcohol abuse Mother   . Diabetes Father   . Kidney disease Father   . Diabetes Sister   . Stroke Sister   . Diabetes Paternal Grandmother    . Cancer Paternal Grandfather        type unknown  . Colon cancer Neg Hx     Social History Social History   Tobacco Use  . Smoking status: Former Smoker    Types: Cigarettes    Quit date: 12/03/2012    Years since quitting: 7.6  . Smokeless tobacco: Never Used  Substance Use Topics  . Alcohol use: Yes    Comment: Occasional    Are there smokers in your home (other than you)?  Yes  Risk Factors Current exercise habits: no  Dietary issues discussed: yes  Cardiac risk factors: advanced age (older than 22 for men, 88 for women), hypertension, male gender, obesity (BMI >= 30 kg/m2) and smoking/ tobacco exposure.  Depression Screen (Note: if answer to either of the following is "Yes", a more complete depression screening is indicated)   Q1: Over the past two weeks, have you felt down, depressed or hopeless? No  Q2: Over the past two weeks, have you felt little interest or pleasure in doing things? No  Have you lost interest or pleasure in daily life? No  Do you often feel hopeless? No  Do you cry easily over simple problems? No  Activities of Daily Living In your present state of health, do you have any difficulty performing the following activities?:  Driving? Yes Managing money?  Yes Feeding yourself? Yes Getting from bed to chair? Yes Climbing a flight of stairs? Yes Preparing food and eating?: Yes Bathing or showering? Yes Getting dressed: Yes Getting to the toilet? Yes Using the toilet:Yes Moving around from place to place: Yes In the past year have you fallen or had a near fall?:No   Are you sexually active?  Yes  Do you have more than one partner?  No  Hearing Difficulties: No Do you often ask people to speak up or repeat themselves? No Do you experience ringing or noises in your ears? No Do you have difficulty understanding soft or whispered voices? Yes   Do you feel that you have a problem with memory? No  Do you often misplace items? No  Do you feel  safe at home?  Yes  Cognitive Testing  Alert? Yes  Normal Appearance?Yes  Oriented to person? Yes  Place? Yes   Time? Yes  Recall of three objects?  Yes-1   Can perform simple calculations? Yes  Displays appropriate judgment?Yes  Can read the correct time from a watch face?Yes   Advanced Directives have been discussed with the patient? Yes   List the Names of Other Physician/Practitioners you currently use: 1.Dr. Nicolette Bang urology     Indicate any recent Medical Services you may have received from other than [facility] providers in the past year (date may be approximate).ED 07/13/2020  Immunization History  Administered Date(s) Administered  . Tdap 03/23/2017    Screening Tests Health Maintenance  Topic Date Due  . COVID-19 Vaccine (1) Never done  .  INFLUENZA VACCINE  07/22/2020  . TETANUS/TDAP  03/24/2027  . COLONOSCOPY  05/09/2027  . Hepatitis C Screening  Completed  . HIV Screening  Completed    All answers were reviewed with the patient and necessary referrals were made:  Kerin Perna, NP   07/25/2020   History reviewed: allergies, current medications, past family history, past medical history, past social history, past surgical history and problem list  Review of Systems Pertinent items noted in HPI and remainder of comprehensive ROS otherwise negative.    Objective:     Blood pressure (!) 158/98, pulse 86, temperature 97.9 F (36.6 C), temperature source Oral, height 5\' 9"  (1.753 m), weight 204 lb 12.8 oz (92.9 kg), SpO2 94 %. Body mass index is 30.24 kg/m.  Vitals:   07/25/20 1455  BP: (!) 158/98  Pulse: 86  Temp: 97.9 F (36.6 C)  TempSrc: Oral  SpO2: 94%  Weight: 204 lb 12.8 oz (92.9 kg)  Height: 5\' 9"  (1.753 m)   General: Vital signs reviewed.  Patient is well-developed and well-nourished obese male , in no acute distress and cooperative with exam.  Head: Normocephalic and atraumatic. Eyes: EOMI, conjunctivae normal, no scleral icterus.   Neck: Supple, trachea midline, normal ROM, no JVD, masses, thyromegaly, or carotid bruit present.  Cardiovascular: RRR, S1 normal, S2 normal, no murmurs, gallops, or rubs. Pulmonary/Chest: Clear to auscultation bilaterally, no wheezes, rales, or rhonchi. Abdominal: Soft, non-tender, non-distended, BS +, no masses, organomegaly, or guarding present.  Musculoskeletal: No joint deformities, erythema, or stiffness, ROM full and nontender. Extremities: No lower extremity edema bilaterally,  pulses symmetric and intact bilaterally. No cyanosis or clubbing. Neurological: A&O x3, Strength is normal and symmetric bilaterally, cranial nerve II-XII are grossly intact, no focal motor deficit, sensory intact to light touch bilaterally.  Skin: Warm, dry and intact. No rashes or erythema. Psychiatric: Normal mood and affect. speech and behavior is normal. Cognition and memory are normal.       Assessment:  Ehan was seen today for annual exam and headache.  Diagnoses and all orders for this visit:  Physical exam Completed   Gastroesophageal reflux disease without esophagitis Discussed eating small frequent meal, reduction in acidic foods, fried foods ,spicy foods, alcohol caffeine and tobacco and certain medications. Avoid laying down after eating 30mins-1hour, elevated head of the bed.  History of left radical nephrectomy  Pure hypercholesterolemia Decrease your fatty foods, red meat, cheese, milk and increase fiber like whole grains and veggies.   Lipids - future   Elevated blood pressure reading without diagnosis of hypertension Counseled on blood pressure goal of less than 130/80, low-sodium, DASH diet,  150 minutes of moderate intensity exercise per week.     Plan:     During the course of the visit the patient was educated and counseled about appropriate screening and preventive services including:    I have personally reviewed: The patient's medical and social history Their use  of alcohol, tobacco or illicit drugs Their current medications and supplements The patient's functional ability including ADLs,fall risks, home safety risks, cognitive, and hearing and visual impairment Diet and physical activities Evidence for depression or mood disorders  The patient's weight, height, BMI.  I have made referrals, counseling, and provided education to the patient based on review of the above and I have provided the patient with a written personalized care plan for preventive services.     Kerin Perna, NP   07/25/2020

## 2020-07-25 NOTE — Patient Instructions (Signed)
   Managing Your Hypertension Hypertension is commonly called high blood pressure. This is when the force of your blood pressing against the walls of your arteries is too strong. Arteries are blood vessels that carry blood from your heart throughout your body. Hypertension forces the heart to work harder to pump blood, and may cause the arteries to become narrow or stiff. Having untreated or uncontrolled hypertension can cause heart attack, stroke, kidney disease, and other problems. What are blood pressure readings? A blood pressure reading consists of a higher number over a lower number. Ideally, your blood pressure should be below 120/80. The first ("top") number is called the systolic pressure. It is a measure of the pressure in your arteries as your heart beats. The second ("bottom") number is called the diastolic pressure. It is a measure of the pressure in your arteries as the heart relaxes. What does my blood pressure reading mean? Blood pressure is classified into four stages. Based on your blood pressure reading, your health care provider may use the following stages to determine what type of treatment you need, if any. Systolic pressure and diastolic pressure are measured in a unit called mm Hg. Normal  Systolic pressure: below 120.  Diastolic pressure: below 80. Elevated  Systolic pressure: 120-129.  Diastolic pressure: below 80. Hypertension stage 1  Systolic pressure: 130-139.  Diastolic pressure: 80-89. Hypertension stage 2  Systolic pressure: 140 or above.  Diastolic pressure: 90 or above. What health risks are associated with hypertension? Managing your hypertension is an important responsibility. Uncontrolled hypertension can lead to:  A heart attack.  A stroke.  A weakened blood vessel (aneurysm).  Heart failure.  Kidney damage.  Eye damage.  Metabolic syndrome.  Memory and concentration problems. What changes can I make to manage my  hypertension? Hypertension can be managed by making lifestyle changes and possibly by taking medicines. Your health care provider will help you make a plan to bring your blood pressure within a normal range. Eating and drinking   Eat a diet that is high in fiber and potassium, and low in salt (sodium), added sugar, and fat. An example eating plan is called the DASH (Dietary Approaches to Stop Hypertension) diet. To eat this way: ? Eat plenty of fresh fruits and vegetables. Try to fill half of your plate at each meal with fruits and vegetables. ? Eat whole grains, such as whole wheat pasta, brown rice, or whole grain bread. Fill about one quarter of your plate with whole grains. ? Eat low-fat diary products. ? Avoid fatty cuts of meat, processed or cured meats, and poultry with skin. Fill about one quarter of your plate with lean proteins such as fish, chicken without skin, beans, eggs, and tofu. ? Avoid premade and processed foods. These tend to be higher in sodium, added sugar, and fat.  Reduce your daily sodium intake. Most people with hypertension should eat less than 1,500 mg of sodium a day.  Limit alcohol intake to no more than 1 drink a day for nonpregnant women and 2 drinks a day for men. One drink equals 12 oz of beer, 5 oz of wine, or 1 oz of hard liquor. Lifestyle  Work with your health care provider to maintain a healthy body weight, or to lose weight. Ask what an ideal weight is for you.  Get at least 30 minutes of exercise that causes your heart to beat faster (aerobic exercise) most days of the week. Activities may include walking, swimming, or biking.    Include exercise to strengthen your muscles (resistance exercise), such as weight lifting, as part of your weekly exercise routine. Try to do these types of exercises for 30 minutes at least 3 days a week.  Do not use any products that contain nicotine or tobacco, such as cigarettes and e-cigarettes. If you need help quitting,  ask your health care provider.  Control any long-term (chronic) conditions you have, such as high cholesterol or diabetes. Monitoring  Monitor your blood pressure at home as told by your health care provider. Your personal target blood pressure may vary depending on your medical conditions, your age, and other factors.  Have your blood pressure checked regularly, as often as told by your health care provider. Working with your health care provider  Review all the medicines you take with your health care provider because there may be side effects or interactions.  Talk with your health care provider about your diet, exercise habits, and other lifestyle factors that may be contributing to hypertension.  Visit your health care provider regularly. Your health care provider can help you create and adjust your plan for managing hypertension. Will I need medicine to control my blood pressure? Your health care provider may prescribe medicine if lifestyle changes are not enough to get your blood pressure under control, and if:  Your systolic blood pressure is 130 or higher.  Your diastolic blood pressure is 80 or higher. Take medicines only as told by your health care provider. Follow the directions carefully. Blood pressure medicines must be taken as prescribed. The medicine does not work as well when you skip doses. Skipping doses also puts you at risk for problems. Contact a health care provider if:  You think you are having a reaction to medicines you have taken.  You have repeated (recurrent) headaches.  You feel dizzy.  You have swelling in your ankles.  You have trouble with your vision. Get help right away if:  You develop a severe headache or confusion.  You have unusual weakness or numbness, or you feel faint.  You have severe pain in your chest or abdomen.  You vomit repeatedly.  You have trouble breathing. Summary  Hypertension is when the force of blood pumping  through your arteries is too strong. If this condition is not controlled, it may put you at risk for serious complications.  Your personal target blood pressure may vary depending on your medical conditions, your age, and other factors. For most people, a normal blood pressure is less than 120/80.  Hypertension is managed by lifestyle changes, medicines, or both. Lifestyle changes include weight loss, eating a healthy, low-sodium diet, exercising more, and limiting alcohol. This information is not intended to replace advice given to you by your health care provider. Make sure you discuss any questions you have with your health care provider. Document Revised: 04/01/2019 Document Reviewed: 11/05/2016 Elsevier Patient Education  2020 [employer]  

## 2020-07-30 ENCOUNTER — Other Ambulatory Visit: Payer: Self-pay | Admitting: Family Medicine

## 2020-07-30 DIAGNOSIS — R202 Paresthesia of skin: Secondary | ICD-10-CM

## 2020-08-01 ENCOUNTER — Ambulatory Visit: Payer: Medicaid Other | Admitting: Urology

## 2020-08-02 ENCOUNTER — Inpatient Hospital Stay (INDEPENDENT_AMBULATORY_CARE_PROVIDER_SITE_OTHER): Payer: Medicaid Other | Admitting: Primary Care

## 2020-08-08 ENCOUNTER — Ambulatory Visit: Payer: Medicaid Other | Admitting: Urology

## 2020-08-09 ENCOUNTER — Other Ambulatory Visit: Payer: Self-pay | Admitting: Primary Care

## 2020-08-09 ENCOUNTER — Encounter (HOSPITAL_COMMUNITY): Payer: Self-pay

## 2020-08-09 ENCOUNTER — Ambulatory Visit (HOSPITAL_COMMUNITY)
Admission: EM | Admit: 2020-08-09 | Discharge: 2020-08-09 | Disposition: A | Discharge status: Home / Self Care | Payer: Medicaid Other | Attending: Physician Assistant | Admitting: Physician Assistant

## 2020-08-09 ENCOUNTER — Other Ambulatory Visit: Payer: Self-pay

## 2020-08-09 DIAGNOSIS — M25461 Effusion, right knee: Secondary | ICD-10-CM

## 2020-08-09 DIAGNOSIS — M25561 Pain in right knee: Secondary | ICD-10-CM

## 2020-08-09 MED ORDER — DICLOFENAC SODIUM 1 % EX GEL: 2.0000 g | Freq: Three times a day (TID) | CUTANEOUS | 0 refills | Status: DC

## 2020-08-09 MED ORDER — ACETAMINOPHEN 500 MG PO TABS
500.0000 mg | ORAL_TABLET | Freq: Four times a day (QID) | ORAL | 0 refills | Status: DC | PRN
Start: 2020-08-09 — End: 2022-12-16

## 2020-08-09 MED ORDER — PREDNISONE 10 MG PO TABS
30.0000 mg | ORAL_TABLET | Freq: Every day | ORAL | 0 refills | Status: AC
Start: 2020-08-09 — End: 2020-08-14

## 2020-08-09 NOTE — Telephone Encounter (Signed)
Requested medication (s) are due for refill today: yes  Requested medication (s) are on the active medication list: yes  Last refill:  06/26/2020  Future visit scheduled: yes  Notes to clinic: this refill cannot be delegated    Requested Prescriptions  Pending Prescriptions Disp Refills   cyclobenzaprine (FLEXERIL) 10 MG tablet [Pharmacy Med Name: CYCLOBENZAPRINE 10MG  TABLETS] 90 tablet 2    Sig: TAKE 1 TABLET(10 MG) BY MOUTH THREE TIMES DAILY AS NEEDED FOR MUSCLE SPASMS      Not Delegated - Analgesics:  Muscle Relaxants Failed - 08/09/2020 12:48 PM      Failed - This refill cannot be delegated      Passed - Valid encounter within last 6 months    Recent Outpatient Visits           2 weeks ago Physical exam   [place], Michelle P, NP   9 months ago Low back pain with right-sided sciatica, unspecified back pain laterality, unspecified chronicity   [place] Kerin Perna, NP   1 year ago Vision changes   [place], [place], NP   1 year ago Pain of right lower extremity   [place], [place] Cage, NP   1 year ago Pure hypercholesterolemia   [place], MD       Future Appointments             In 6 days Kerin Perna, NP [place]   In 2 weeks [place], Candee Furb[place], MD [facility]

## 2020-08-09 NOTE — ED Provider Notes (Signed)
[place]    CSN: [id number] Arrival date & time: 08/09/20  0947      History   Chief Complaint Chief Complaint  Patient presents with  . Knee Pain    HPI Garrett Clark is a 59 y.o. male.   Patient presents for right knee pain and swelling for last 4-5 days. Reports pain primarily in front of knee. Reports pain with bending and walking. Has had some swelling. No redness reported. No fevers or chills. No traumas, falls or injuries. No history of similar. Has no taken anything so far. Has been improving some. No hx of gout.     Past Medical History:  Diagnosis Date  . Cancer ([place])    Left kidney  . Hyperlipidemia   . Internal hemorrhoids   . Polycystic liver disease   . Substance abuse [facility])     Patient Active Problem List   Diagnosis Date Noted  . GERD (gastroesophageal reflux disease) 01/13/2019  . History of left radical nephrectomy 01/13/2019  . Renal cell carcinoma of left kidney ([place]) 12/07/2017  . Prolapsed internal hemorrhoids, grade 2 06/19/2017  . Pure hypercholesterolemia 03/30/2017  . History of cocaine abuse ([place]) 03/23/2017    Past Surgical History:  Procedure Laterality Date  . COLONOSCOPY    . HEMORRHOID BANDING    . ROBOT ASSISTED LAPAROSCOPIC NEPHRECTOMY Left 12/07/2017   Procedure: XI ROBOTIC ASSISTED LAPAROSCOPIC NEPHRECTOMY;  Surgeon: Cleon Gustin, MD;  Location: [facility];  Service: Urology;  Laterality: Left;       Home Medications    Prior to Admission medications   Medication Sig Start Date End Date Taking? Authorizing Provider  acetaminophen (TYLENOL) 500 MG tablet Take 1 tablet (500 mg total) by mouth every 6 (six) hours as needed. 08/09/20   Aster Screws, Marguerita Beards, PA-C  atorvastatin (LIPITOR) 40 MG tablet TAKE 1 TABLET(40 MG) BY MOUTH DAILY Patient taking differently: Take 40 mg by mouth daily.  06/26/20   Kerin Perna, NP  cyclobenzaprine (FLEXERIL) 10 MG tablet TAKE 1 TABLET(10 MG) BY MOUTH THREE TIMES DAILY AS  NEEDED FOR MUSCLE SPASMS Patient taking differently: Take 10 mg by mouth 3 (three) times daily as needed for muscle spasms.  04/20/20   Kerin Perna, NP  diclofenac Sodium (VOLTAREN) 1 % GEL Apply 2 g topically in the morning, at noon, and at bedtime. 08/09/20   Kiarra Kidd, Marguerita Beards, PA-C  gabapentin (NEURONTIN) 300 MG capsule TAKE 1 CAPSULE(300 MG) BY MOUTH TWICE DAILY 07/30/20   Kerin Perna, NP  predniSONE (DELTASONE) 10 MG tablet Take 3 tablets (30 mg total) by mouth daily for 5 days. 08/09/20 08/14/20  Obed Samek, Marguerita Beards, PA-C    Family History Family History  Problem Relation Age of Onset  . Diabetes Mother   . Hypertension Mother   . Alcohol abuse Mother   . Diabetes Father   . Kidney disease Father   . Diabetes Sister   . Stroke Sister   . Diabetes Paternal Grandmother   . Cancer Paternal Grandfather        type unknown  . Colon cancer Neg Hx     Social History Social History   Tobacco Use  . Smoking status: Former Smoker    Types: Cigarettes    Quit date: 12/03/2012    Years since quitting: 7.6  . Smokeless tobacco: Never Used  Vaping Use  . Vaping Use: Never used  Substance Use Topics  . Alcohol use: Yes    Comment:  Occasional  . 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   Review of Systems Review of Systems   Physical Exam Triage Vital Signs ED Triage Vitals  Enc Vitals Group     BP 08/09/20 1123 133/74     Pulse Rate 08/09/20 1123 (!) 58     Resp 08/09/20 1123 16     Temp 08/09/20 1123 97.8 F (36.6 C)     Temp src --      SpO2 08/09/20 1123 98 %     Weight --      Height --      Head Circumference --      Peak Flow --      Pain Score 08/09/20 1122 7     Pain Loc --      Pain Edu? --      Excl. in [place]? --    No data found.  Updated Vital Signs BP 133/74   Pulse (!) 58   Temp  97.8 F (36.6 C)   Resp 16   SpO2 98%   Visual Acuity Right Eye Distance:   Left Eye Distance:   Bilateral Distance:    Right Eye Near:   Left Eye Near:    Bilateral Near:     Physical Exam Vitals and nursing note reviewed.  Constitutional:      Appearance: He is well-developed.  HENT:     Head: Normocephalic and atraumatic.  Eyes:     Conjunctiva/sclera: Conjunctivae normal.  Cardiovascular:     Rate and Rhythm: Normal rate.  Pulmonary:     Effort: Pulmonary effort is normal. No respiratory distress.  Musculoskeletal:     Cervical back: Neck supple.     Comments: Right knee without swelling, erythema or deformity. Mild infrapatella effusion present. Some TTP along anterior knee. No bone TTP. FROM. Patella freely moving, no pain with compression. Joint stable to anterior and posterior draw. No crepitus or clicking. No popliteal swelling or mass.   No lower leg swelling.   Skin:    General: Skin is warm and dry.     Capillary Refill: Capillary refill takes less than 2 seconds.  Neurological:     Mental Status: He is alert.      UC Treatments / Results  Labs (all labs ordered are listed, but only abnormal results are displayed) Labs Reviewed - No data to display  EKG   Radiology No results found.  Procedures Procedures (including critical care time)  Medications Ordered in UC Medications - No data to display  Initial Impression / Assessment and Plan / UC Course  I have reviewed the triage vital signs and the nursing notes.  Pertinent labs & imaging results that were available during my care of the patient were reviewed by me and considered in my medical decision making (see chart for details).     #Right knee pain #Right knee effusion Patient is a 59 year old presenting with right knee pain and effusion. Doubt septic or gout. Suspect likely some aspect of arthritis with inflammatory pain today. Will trial prednisone, topical voltaren and tylenol as  needed. Will have follow up with sports med. Patient verbalized understanding plan of care.  Final Clinical Impressions(s) / UC Diagnoses   Final diagnoses:  Effusion of right knee  Acute pain of right knee     Discharge Instructions     Take the prednisone as prescribed Use gel up to 3 times a day Tylenol as prescribed Ice and rest knee, with gentle stretching  Call sports medicine group for follow up      ED Prescriptions    Medication Sig Dispense Auth. Provider   predniSONE (DELTASONE) 10 MG tablet Take 3 tablets (30 mg total) by mouth daily for 5 days. 15 tablet Keandra Medero, Marguerita Beards, PA-C   acetaminophen (TYLENOL) 500 MG tablet Take 1 tablet (500 mg total) by mouth every 6 (six) hours as needed. 30 tablet Normand Damron, Marguerita Beards, PA-C   diclofenac Sodium (VOLTAREN) 1 % GEL Apply 2 g topically in the morning, at noon, and at bedtime. 50 g Asahd Can, Marguerita Beards, PA-C     PDMP not reviewed this encounter.   Purnell Shoemaker, PA-C 08/10/20 0000

## 2020-08-09 NOTE — Discharge Instructions (Signed)
Take the prednisone as prescribed Use gel up to 3 times a day Tylenol as prescribed Ice and rest knee, with gentle stretching  Call sports medicine group for follow up

## 2020-08-09 NOTE — ED Triage Notes (Signed)
Pt c/o right knee pain and swelling since this weekend, denies injury

## 2020-08-15 ENCOUNTER — Other Ambulatory Visit: Payer: Self-pay

## 2020-08-15 ENCOUNTER — Ambulatory Visit (INDEPENDENT_AMBULATORY_CARE_PROVIDER_SITE_OTHER): Payer: Medicaid Other | Admitting: Primary Care

## 2020-08-15 ENCOUNTER — Telehealth: Payer: Self-pay

## 2020-08-15 ENCOUNTER — Encounter (INDEPENDENT_AMBULATORY_CARE_PROVIDER_SITE_OTHER): Payer: Self-pay | Admitting: Primary Care

## 2020-08-15 VITALS — BP 136/80 | HR 79 | Temp 98.2°F | Resp 16 | Wt 209.6 lb

## 2020-08-15 DIAGNOSIS — M25461 Effusion, right knee: Secondary | ICD-10-CM

## 2020-08-15 DIAGNOSIS — G8929 Other chronic pain: Secondary | ICD-10-CM

## 2020-08-15 DIAGNOSIS — Z09 Encounter for follow-up examination after completed treatment for conditions other than malignant neoplasm: Secondary | ICD-10-CM

## 2020-08-15 DIAGNOSIS — M545 Low back pain: Secondary | ICD-10-CM

## 2020-08-15 NOTE — Progress Notes (Signed)
Assessment and Plan: Garrett Clark was seen today for hospitalization follow-up.  Diagnoses and all orders for this visit:  Effusion of right knee -     AMB referral to orthopedics  Hospital discharge follow-up Copied from discharge on 08/09/2020  Discharge Instructions     Take the prednisone as prescribed Use gel up to 3 times a day Tylenol as prescribed Ice and rest knee, with gentle stretching  Call sports medicine group for follow up  AMB referral to orthopedics  Chronic right-sided low back pain, unspecified whether sciatica present BACK PAIN  Location: right mid back Quality: sharp, throbbing and uncomfortable Onset: gradual Worse with: bending and lifting boxes at work      Better with: rest  Radiation: no  Trauma:  no Best sitting/standing/leaning back not forward:yes  Red Flags Fecal/urinary incontinence: no  Numbness/Weakness: no  Fever/chills/sweats: no  Night pain: no Unexplained weight loss: no  No relief with bedrest: yes  h/o cancer/immunosuppression: yes  IV drug use: no  PMH of osteoporosis or chronic steroid use: no  -     AMB referral to orthopedics     HPI Garrett Clark 59 y.o.male presents for follow up from the Emergency room .Discharged the same day  Presented for right knee pain and swelling for last 4-5 days . He expresses pain on the right side of back- 8/10. Sharp stabbing.   Past Medical History:  Diagnosis Date   Cancer (Garrett Clark)    Left kidney   Hyperlipidemia    Internal hemorrhoids    Polycystic liver disease    Substance abuse (Garrett Clark)      No Known Allergies    Current Outpatient Medications on File Prior to Visit  Medication Sig Dispense Refill   acetaminophen (TYLENOL) 500 MG tablet Take 1 tablet (500 mg total) by mouth every 6 (six) hours as needed. 30 tablet 0   atorvastatin (LIPITOR) 40 MG tablet TAKE 1 TABLET(40 MG) BY MOUTH DAILY (Patient taking differently: Take 40 mg by mouth daily. ) 90 tablet 0    cyclobenzaprine (FLEXERIL) 10 MG tablet TAKE 1 TABLET(10 MG) BY MOUTH THREE TIMES DAILY AS NEEDED FOR MUSCLE SPASMS 90 tablet 2   diclofenac Sodium (VOLTAREN) 1 % GEL Apply 2 g topically in the morning, at noon, and at bedtime. 50 g 0   gabapentin (NEURONTIN) 300 MG capsule TAKE 1 CAPSULE(300 MG) BY MOUTH TWICE DAILY 60 capsule 3   No current facility-administered medications on file prior to visit.    ROS: all negative except above.   Physical Exam: Filed Weights   08/15/20 1028  Weight: 209 lb 9.6 oz (95.1 kg)   BP 136/80    Pulse 79    Temp 98.2 F (36.8 C)    Resp 16    Wt 209 lb 9.6 oz (95.1 kg)    SpO2 98%    BMI 30.95 kg/m  General Appearance: Well nourished, in no apparent distress. Eyes: PERRLA, EOMs, conjunctiva no swelling or erythema Sinuses: No Frontal/maxillary tenderness ENT/Mouth: Ext aud canals clear, TMs without erythema, bulging. No erythema, swelling, or exudate on post pharynx.  Tonsils not swollen or erythematous. Hearing normal.  Neck: Supple, thyroid normal.  Respiratory: Respiratory effort normal, BS equal bilaterally without rales, rhonchi, wheezing or stridor.  Cardio: RRR with no MRGs. Brisk peripheral pulses without edema.  Abdomen: Soft, + BS.  Non tender, no guarding, rebound, hernias, masses. Lymphatics: Non tender without lymphadenopathy.  Musculoskeletal: Full ROM, 5/5 strength, normal gait.  Skin: Warm,  dry without rashes, lesions, ecchymosis.  Neuro: Cranial nerves intact. Normal muscle tone, no cerebellar symptoms. Sensation intact.  Psych: Awake and oriented X 3, normal affect, Insight and Judgment appropriate.     Garrett Perna, NP 10:34 AM

## 2020-08-15 NOTE — Telephone Encounter (Signed)
Tried calling pt to inform him of  lab work and CXR needed before 9/9 appt. Unable to leave message. Appts mailed.

## 2020-08-15 NOTE — Progress Notes (Signed)
Pt states he is having right side pain

## 2020-08-23 ENCOUNTER — Other Ambulatory Visit: Payer: Self-pay

## 2020-08-23 ENCOUNTER — Other Ambulatory Visit: Payer: Medicaid Other

## 2020-08-23 ENCOUNTER — Ambulatory Visit (HOSPITAL_COMMUNITY)
Admission: RE | Admit: 2020-08-23 | Discharge: 2020-08-23 | Disposition: A | Discharge status: Home / Self Care | Payer: Medicaid Other | Source: Ambulatory Visit | Attending: Urology | Admitting: Urology

## 2020-08-23 DIAGNOSIS — Z85528 Personal history of other malignant neoplasm of kidney: Secondary | ICD-10-CM | POA: Diagnosis not present

## 2020-08-23 DIAGNOSIS — C642 Malignant neoplasm of left kidney, except renal pelvis: Secondary | ICD-10-CM | POA: Diagnosis not present

## 2020-08-23 DIAGNOSIS — R918 Other nonspecific abnormal finding of lung field: Secondary | ICD-10-CM | POA: Diagnosis not present

## 2020-08-24 LAB — COMPREHENSIVE METABOLIC PANEL
ALT: 23 IU/L (ref 0–44)
AST: 23 IU/L (ref 0–40)
Albumin/Globulin Ratio: 1.3 (ref 1.2–2.2)
Albumin: 4.4 g/dL (ref 3.8–4.9)
Alkaline Phosphatase: 108 IU/L (ref 48–121)
BUN/Creatinine Ratio: 9 (ref 9–20)
BUN: 16 mg/dL (ref 6–24)
Bilirubin Total: 0.7 mg/dL (ref 0.0–1.2)
CO2: 23 mmol/L (ref 20–29)
Calcium: 9.2 mg/dL (ref 8.7–10.2)
Chloride: 104 mmol/L (ref 96–106)
Creatinine, Ser: 1.73 mg/dL — ABNORMAL HIGH (ref 0.76–1.27)
GFR calc Af Amer: 50 mL/min/{1.73_m2} — ABNORMAL LOW (ref >59)
GFR calc non Af Amer: 43 mL/min/{1.73_m2} — ABNORMAL LOW (ref >59)
Globulin, Total: 3.3 g/dL (ref 1.5–4.5)
Glucose: 80 mg/dL (ref 65–99)
Potassium: 4.6 mmol/L (ref 3.5–5.2)
Sodium: 140 mmol/L (ref 134–144)
Total Protein: 7.7 g/dL (ref 6.0–8.5)

## 2020-08-29 ENCOUNTER — Ambulatory Visit (INDEPENDENT_AMBULATORY_CARE_PROVIDER_SITE_OTHER): Payer: Medicaid Other | Admitting: Urology

## 2020-08-29 ENCOUNTER — Encounter: Payer: Self-pay | Admitting: Urology

## 2020-08-29 ENCOUNTER — Other Ambulatory Visit: Payer: Self-pay

## 2020-08-29 VITALS — BP 124/78 | HR 99 | Temp 98.5°F | Ht 69.0 in | Wt 209.6 lb

## 2020-08-29 DIAGNOSIS — C642 Malignant neoplasm of left kidney, except renal pelvis: Secondary | ICD-10-CM

## 2020-08-29 LAB — URINALYSIS, ROUTINE W REFLEX MICROSCOPIC
Bilirubin, UA: NEGATIVE
Glucose, UA: NEGATIVE
Ketones, UA: NEGATIVE
Leukocytes,UA: NEGATIVE
Nitrite, UA: NEGATIVE
Protein,UA: NEGATIVE
Specific Gravity, UA: 1.02 (ref 1.005–1.030)
Urobilinogen, Ur: 2 mg/dL — ABNORMAL HIGH (ref 0.2–1.0)
pH, UA: 7 (ref 5.0–7.5)

## 2020-08-29 LAB — MICROSCOPIC EXAMINATION
Bacteria, UA: NONE SEEN
Epithelial Cells (non renal): NONE SEEN /hpf (ref 0–10)
Renal Epithel, UA: NONE SEEN /hpf
WBC, UA: NONE SEEN /hpf (ref 0–5)

## 2020-08-29 NOTE — Patient Instructions (Signed)
Renal Mass  A renal mass is a growth in the kidney. A renal mass may be found while performing an MRI, CT scan, or ultrasound for other problems of the abdomen. Certain types of cancers, infections, or injuries can cause a renal mass. A renal mass that is cancerous (malignant) may grow or spread quickly. Others are harmless (benign). What are common types of renal masses? Renal masses include:  Tumors. These may be cancerous (malignant) or noncancerous (benign). ? The most common type of kidney cancer is renal cell carcinoma. ? The most common benign tumors of the kidney include renal adenomas, oncocytomas, and angiomyolipoma (AML).  Cysts. These are fluid-filled sacs that form on or in the kidney. ? It is not always known what causes a cyst to develop in or on the kidney. ? Most kidney cysts do not cause symptoms and do not need to be treated. What type of testing might I need? Your health care provider may recommend that you have tests to diagnose the cause of your renal mass. The following tests may be done if a renal mass is found:  Physical exam.  Blood tests.  Urine tests.  Imaging tests, such as ultrasound, CT scan, or MRI.  Biopsy. This is a small sample that is removed from the renal mass and tested in a lab. The exact tests and how often they are done will depend on:  The size and appearance of the renal mass.  Risk factors or medical conditions that increase your risk for problems.  Any symptoms associated with the renal mass, or concerns that you have about it. Tests and physical exams may be done once, or they may be done regularly for a period of time. Tests and exams that are done regularly will help monitor whether the mass is growing and beginning to cause problems. What are common treatments for renal masses? Treatment is not always needed for this condition. Your health care provider may recommend careful monitoring (watchful waiting) and regular tests and exams.  Treatment will depend on the cause of the mass. Follow these instructions at home: What you need to do at home will depend on the cause of the mass. Follow the instructions that your health care provider gives to you. In general:  Take over-the-counter and prescription medicines only as told by your health care provider.  If you are prescribed an antibiotic medicine, take it as told by your health care provider. Do not stop taking the antibiotic even if you start to feel better.  Follow any restrictions that are given to you by your health care provider.  Keep all follow-up visits as told by your health care provider. This is important. ? You may need to see your health care provider once or twice a year to have CT scans and ultrasounds done. These tests will show if your renal mass has changed or grown bigger. Contact a health care provider if you:  Have pain in the side or back (flank pain).  Have a fever.  Feel full soon after eating.  Have pain or swelling in the abdomen.  Lose weight. Get help right away if:  Your pain gets worse.  There is blood in your urine.  You cannot urinate.  You have chest pain.  You have trouble breathing. Summary  A renal mass is a growth in the kidney. It may be cancerous (malignant) and grow or spread quickly, or it may be harmless (benign).  Renal masses may be found while performing   an MRI, CT scan, or ultrasound for other problems of the abdomen.  Your health care provider may recommend that you have tests to diagnose the cause of your renal mass. This may include a physical exam, blood tests, urine tests, imaging, or a biopsy.  Treatment is not always needed for this condition. Careful monitoring (watchful waiting) may be recommended. This information is not intended to replace advice given to you by your health care provider. Make sure you discuss any questions you have with your health care provider. Document Revised: 01/14/2018  Document Reviewed: 01/14/2018 Elsevier Patient Education  2020 [employer]vier Inc.  

## 2020-08-29 NOTE — Progress Notes (Signed)
Letter sent.

## 2020-08-29 NOTE — Progress Notes (Signed)
08/29/2020 3:29 PM   Garrett Clark [birth year] [id number]  Referring provider: Kerin Perna, NP [address] [place],  [place] [address]  Followup RCC  HPI: Mr Reinheimer is a 59yo here for followup for left RCC T1b s/p left radical nephrectomy in 11/2017.  CXR shows no evidence of metastatic disease. CReatinine 1.73. He has intermittent right back pain which is chronic. He is taking flexeril prn for the pain which improves the pain. NO other associated symptoms. No exacerbating/alleviating events.     PMH: Past Medical History:  Diagnosis Date  . Cancer ([place])    Left kidney  . Hyperlipidemia   . Internal hemorrhoids   . Polycystic liver disease   . Substance abuse [facility])     Surgical History: Past Surgical History:  Procedure Laterality Date  . COLONOSCOPY    . HEMORRHOID BANDING    . ROBOT ASSISTED LAPAROSCOPIC NEPHRECTOMY Left 12/07/2017   Procedure: XI ROBOTIC ASSISTED LAPAROSCOPIC NEPHRECTOMY;  Surgeon: Cleon Gustin, MD;  Location: [facility];  Service: Urology;  Laterality: Left;    Home Medications:  Allergies as of 08/29/2020   No Known Allergies     Medication List       Accurate as of August 29, 2020  3:29 PM. If you have any questions, ask your nurse or doctor.        acetaminophen 500 MG tablet Commonly known as: TYLENOL Take 1 tablet (500 mg total) by mouth every 6 (six) hours as needed.   atorvastatin 40 MG tablet Commonly known as: LIPITOR TAKE 1 TABLET(40 MG) BY MOUTH DAILY What changed: See the new instructions.   cyclobenzaprine 10 MG tablet Commonly known as: FLEXERIL TAKE 1 TABLET(10 MG) BY MOUTH THREE TIMES DAILY AS NEEDED FOR MUSCLE SPASMS   diclofenac Sodium 1 % Gel Commonly known as: Voltaren Apply 2 g topically in the morning, at noon, and at bedtime.   gabapentin 300 MG capsule Commonly known as: NEURONTIN TAKE 1 CAPSULE(300 MG) BY MOUTH TWICE DAILY       Allergies: No Known Allergies  Family  History: Family History  Problem Relation Age of Onset  . Diabetes Mother   . Hypertension Mother   . Alcohol abuse Mother   . Diabetes Father   . Kidney disease Father   . Diabetes Sister   . Stroke Sister   . Diabetes Paternal Grandmother   . Cancer Paternal Grandfather        type unknown  . Colon cancer Neg Hx     Social History:  reports that he quit smoking about 7 years ago. His smoking use included cigarettes. He has never used smokeless tobacco. He reports current alcohol use. He reports current drug use. Drug: Marijuana.  ROS: All other review of systems were reviewed and are negative except what is noted above in HPI  Physical Exam: BP 124/78   Pulse 99   Temp 98.5 F (36.9 C)   Ht 5\' 9"  (1.753 m)   Wt 209 lb 9.6 oz (95.1 kg)   BMI 30.95 kg/m   Constitutional:  Alert and oriented, No acute distress. HEENT: [place] AT, moist mucus membranes.  Trachea midline, no masses. Cardiovascular: No clubbing, cyanosis, or edema. Respiratory: Normal respiratory effort, no increased work of breathing. GI: Abdomen is soft, nontender, nondistended, no abdominal masses GU: No CVA tenderness.  Lymph: No cervical or inguinal lymphadenopathy. Skin: No rashes, bruises or suspicious lesions. Neurologic: Grossly intact, no focal deficits, moving all 4 extremities. Psychiatric: Normal  mood and affect.  Laboratory Data: Lab Results  Component Value Date   WBC 4.9 07/13/2020   HGB 13.6 07/13/2020   HCT 40.0 07/13/2020   MCV 95.1 07/13/2020   PLT 296 07/13/2020    Lab Results  Component Value Date   CREATININE 1.73 (H) 08/23/2020    No results found for: PSA  No results found for: TESTOSTERONE  No results found for: HGBA1C  Urinalysis    Component Value Date/Time   COLORURINE YELLOW 07/13/2020 1945   APPEARANCEUR CLEAR 07/13/2020 1945   LABSPEC 1.021 07/13/2020 1945   PHURINE 6.0 07/13/2020 1945   GLUCOSEU NEGATIVE 07/13/2020 1945   HGBUR NEGATIVE 07/13/2020 [place] NEGATIVE 07/13/2020 1945   BILIRUBINUR neg 09/21/2017 [place] 07/13/2020 1945   PROTEINUR NEGATIVE 07/13/2020 1945   UROBILINOGEN 1.0 03/07/2020 1204   NITRITE NEGATIVE 07/13/2020 1945   LEUKOCYTESUR NEGATIVE 07/13/2020 1945    No results found for: LABMICR, [place], RBCUA, LABEPIT, MUCUS, BACTERIA  Pertinent Imaging: Chest Xray 08/23/2020: Images reviewed and discussed with the patient No results found for this or any previous visit.  No results found for this or any previous visit.  No results found for this or any previous visit.  No results found for this or any previous visit.  No results found for this or any previous visit.  No results found for this or any previous visit.  No results found for this or any previous visit.  No results found for this or any previous visit.   Assessment & Plan:    1. Renal cell carcinoma of left kidney (HCC) -We will start yearly surveillance with CMP and CXR   No follow-ups on file.  Nicolette Bang, MD  [facility] [place]

## 2020-08-29 NOTE — Progress Notes (Signed)
Urological Symptom Review  Patient is experiencing the following symptoms: none   Review of Systems  Gastrointestinal (upper)  : Negative for upper GI symptoms  Gastrointestinal (lower) : Negative for lower GI symptoms  Constitutional : Fatigue  Skin: Negative for skin symptoms  Eyes: Blurred vision  Ear/Nose/Throat : Negative for Ear/Nose/Throat symptoms  Hematologic/Lymphatic: Negative for Hematologic/Lymphatic symptoms  Cardiovascular : Leg swelling Chest pain  Respiratory : Shortness of breath  Endocrine: Negative for endocrine symptoms  Musculoskeletal: Back pain Joint pain  Neurological: Negative for neurological symptoms  Psychologic: Depression Anxiety

## 2020-08-29 NOTE — Addendum Note (Signed)
Addended by: Valentina Lucks on: 08/29/2020 04:30 PM   Modules accepted: Orders

## 2020-08-30 ENCOUNTER — Inpatient Hospital Stay (INDEPENDENT_AMBULATORY_CARE_PROVIDER_SITE_OTHER): Payer: Medicaid Other | Admitting: Primary Care

## 2020-08-31 ENCOUNTER — Ambulatory Visit: Payer: Medicaid Other | Admitting: Surgical

## 2020-09-11 ENCOUNTER — Other Ambulatory Visit: Payer: Self-pay

## 2020-09-11 ENCOUNTER — Encounter (INDEPENDENT_AMBULATORY_CARE_PROVIDER_SITE_OTHER): Payer: Self-pay | Admitting: Primary Care

## 2020-09-11 ENCOUNTER — Ambulatory Visit (INDEPENDENT_AMBULATORY_CARE_PROVIDER_SITE_OTHER): Payer: Medicaid Other | Admitting: Primary Care

## 2020-09-11 VITALS — BP 129/88 | HR 107 | Temp 97.5°F | Ht 69.0 in | Wt 203.6 lb

## 2020-09-11 DIAGNOSIS — G510 Bell's palsy: Secondary | ICD-10-CM

## 2020-09-11 DIAGNOSIS — M25461 Effusion, right knee: Secondary | ICD-10-CM

## 2020-09-11 DIAGNOSIS — Z09 Encounter for follow-up examination after completed treatment for conditions other than malignant neoplasm: Secondary | ICD-10-CM

## 2020-09-11 NOTE — Progress Notes (Signed)
Pt states his knee was swollen three weeks ago and stayed that way for a week

## 2020-09-11 NOTE — Progress Notes (Signed)
  Assessment and Plan: Garrett Clark was seen today for bells palsy and knee pain.  Diagnoses and all orders for this visit:  Effusion of right knee Refer to orthopedics unstable gait due to the pain and current medication is not helping.   Bell's palsy Concerned if problem was present every facial feature was systematical. Discussed he is not capable to determine Bell's palsy , TIA or stroke always any facial drooping , slurred speech proceeded to ED/911      HPI Garrett Clark y.o.male presents continues to have  pain with bending and walking. 2010 notice left sided weakness in left arm (- he is left handed ) noticing increase .  Past Medical History:  Diagnosis Date  . Cancer ([place])    Left kidney  . Hyperlipidemia   . Internal hemorrhoids   . Polycystic liver disease   . Substance abuse ([place])      No Known Allergies    Current Outpatient Medications on File Prior to Visit  Medication Sig Dispense Refill  . acetaminophen (TYLENOL) 500 MG tablet Take 1 tablet (500 mg total) by mouth every 6 (six) hours as needed. 30 tablet 0  . atorvastatin (LIPITOR) 40 MG tablet TAKE 1 TABLET(40 MG) BY MOUTH DAILY (Patient taking differently: Take 40 mg by mouth daily. ) 90 tablet 0  . cyclobenzaprine (FLEXERIL) 10 MG tablet TAKE 1 TABLET(10 MG) BY MOUTH THREE TIMES DAILY AS NEEDED FOR MUSCLE SPASMS 90 tablet 2  . diclofenac Sodium (VOLTAREN) 1 % GEL Apply 2 g topically in the morning, at noon, and at bedtime. 50 g 0  . gabapentin (NEURONTIN) 300 MG capsule TAKE 1 CAPSULE(300 MG) BY MOUTH TWICE DAILY 60 capsule 3   No current facility-administered medications on file prior to visit.    ROS: all negative except above.   Physical Exam: Filed Weights   09/11/20 1443  Weight: 203 lb 9.6 oz (92.4 kg)   BP 129/88 (BP Location: Left Arm, Patient Position: Sitting, Cuff Size: Normal)   Pulse (!) 107   Temp (!) 97.5 F (36.4 C) (Temporal)   Ht 5\' 9"  (1.753 m)   Wt 203 lb 9.6 oz (92.4  kg)   SpO2 95%   BMI 30.07 kg/m  General Appearance: Well nourished, overweight  male oin no apparent distress. Eyes: PERRLA, EOMs, conjunctiva present  No erythema Sinuses: No Frontal/maxillary tenderness ENT/Mouth: Ext aud canals clear, TMs without erythema, bulging. No erythema, swelling, or exudate on post pharynx.  Tonsils not swollen or erythematous. Hearing normal.  Neck: Supple, thyroid normal.  Respiratory: Respiratory effort normal, BS equal bilaterally without rales, rhonchi, wheezing or stridor.  Cardio: RRR with  MRGs. Brisk peripheral pulses without edema.  Abdomen: Soft, + BS.  Non tender, no guarding, rebound, hernias, masses. Lymphatics: Non tender without lymphadenopathy.  Musculoskeletal: Full ROM, 5/5 strength, normal gait.  Skin: Warm, dry without rashes, lesions, ecchymosis.  Neuro: Cranial nerves intact. Normal muscle tone, no cerebellar symptoms. Sensation intact.  Psych: Awake and oriented X 3, normal affect, Insight and Judgment appropriate.     Kerin Perna, NP 3:06 PM

## 2020-09-12 ENCOUNTER — Ambulatory Visit (INDEPENDENT_AMBULATORY_CARE_PROVIDER_SITE_OTHER): Payer: Medicaid Other | Admitting: Orthopedic Surgery

## 2020-09-12 ENCOUNTER — Ambulatory Visit: Payer: Self-pay

## 2020-09-12 ENCOUNTER — Encounter: Payer: Self-pay | Admitting: Orthopedic Surgery

## 2020-09-12 VITALS — Ht 69.5 in | Wt 203.0 lb

## 2020-09-12 DIAGNOSIS — M25461 Effusion, right knee: Secondary | ICD-10-CM

## 2020-09-12 DIAGNOSIS — M25561 Pain in right knee: Secondary | ICD-10-CM | POA: Diagnosis not present

## 2020-09-12 MED ORDER — BUPIVACAINE HCL 0.25 % IJ SOLN
4.0000 mL | INTRAMUSCULAR | Status: AC | PRN
  Administered 2020-09-12: 4 mL via INTRA_ARTICULAR

## 2020-09-12 MED ORDER — METHYLPREDNISOLONE ACETATE 40 MG/ML IJ SUSP
40.0000 mg | INTRAMUSCULAR | Status: AC | PRN
  Administered 2020-09-12: 40 mg via INTRA_ARTICULAR

## 2020-09-12 MED ORDER — LIDOCAINE HCL 1 % IJ SOLN
5.0000 mL | INTRAMUSCULAR | Status: AC | PRN
  Administered 2020-09-12: 5 mL

## 2020-09-12 NOTE — Progress Notes (Signed)
Office Visit Note   Patient: [place] Clark           Date of Birth: January 26, 1961           MRN: [id number] Visit Date: 09/12/2020 Requested by: Kerin Perna, NP [address] [place],  [place] [address] PCP: Kerin Perna, NP  Subjective: Chief Complaint  Patient presents with  . 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  Has had some occasional swelling in the thigh but never in the knee.  He felt like there was "fluid on top".  Takes gabapentin muscle relaxer and Tylenol because he cannot take anti-inflammatories due to the fact that he has a history of renal cancer for which he had 1 kidney removed.  This happened 3 years ago.  He does have father and brother who have gout.  Denies any fevers chills or trauma to the right knee.              ROS: All systems reviewed are negative as they relate to the chief complaint within the history of present illness.  Patient denies  fevers or chills.   Assessment & Plan: Visit Diagnoses:  1. Acute pain of right knee     Plan: Impression is resolving right knee pain likely gout with no mechanical symptoms and fairly normal-appearing radiographs.  Plan is right knee aspiration today as he is still having some symptoms.  Fluid is sent for crystal analysis.  Cortisone injection performed.  Follow-up with me as needed should he develop any more joint pain and I will call him with results of his aspiration.  Follow-Up Instructions: Return if symptoms worsen or fail to improve.   Orders:  Orders Placed This Encounter  Procedures  . XR KNEE 3 VIEW RIGHT  . Cell count + diff,  w/ cryst-synvl fld   No orders of the defined types were placed in this encounter.     Procedures: Large Joint Inj: R knee on 09/12/2020 7:35 PM Indications: diagnostic evaluation, joint swelling and  pain Details: 18 G 1.5 in needle, superolateral approach  Arthrogram: No  Medications: 5 mL lidocaine 1 %; 40 mg methylPREDNISolone acetate 40 MG/ML; 4 mL bupivacaine 0.25 % Aspirate: 15 mL serous; sent for lab analysis Outcome: tolerated well, no immediate complications Procedure, treatment alternatives, risks and benefits explained, specific risks discussed. Consent was given by the patient. Immediately prior to procedure a time out was called to verify the correct patient, procedure, equipment, support staff and site/side marked as required. Patient was prepped and draped in the usual sterile fashion.       Clinical Data: No additional findings.  Objective: Vital Signs: Ht 5' 9.5" (1.765 m)   Wt 203 lb (92.1 kg)   BMI 29.55 kg/m   Physical Exam:   Constitutional: Patient appears well-developed HEENT:  Head: Normocephalic Eyes:EOM are normal Neck: Normal range of motion Cardiovascular: Normal rate Pulmonary/chest: Effort normal Neurologic: Patient is alert Skin: Skin is warm Psychiatric: Patient has normal mood and affect    Ortho Exam: Ortho exam demonstrates normal gait alignment.  Mild effusion right knee no effusion left knee.  Extensor mechanism is intact.  Collateral and cruciate ligaments are stable.  Range of motion is full.  No proximal lymphadenopathy noted in the right leg.  No warmth to the right knee versus  left knee.  Pedal pulses palpable.  Specialty Comments:  No specialty comments available.  Imaging: XR KNEE 3 VIEW RIGHT  Result Date: 09/12/2020 AP lateral merchant right knee reviewed.  Mild medial joint space narrowing is present without spur formation.  Lateral and patellofemoral compartments have minimal degenerative changes.  No acute fracture.  Alignment intact.    PMFS History: Patient Active Problem List   Diagnosis Date Noted  . GERD (gastroesophageal reflux disease) 01/13/2019  . History of left radical nephrectomy 01/13/2019  .  Renal cell carcinoma of left kidney ([place]) 12/07/2017  . Prolapsed internal hemorrhoids, grade 2 06/19/2017  . Pure hypercholesterolemia 03/30/2017  . History of cocaine abuse ([place]) 03/23/2017   Past Medical History:  Diagnosis Date  . Cancer ([place])    Left kidney  . Hyperlipidemia   . Internal hemorrhoids   . Polycystic liver disease   . Substance abuse ([place])     Family History  Problem Relation Age of Onset  . Diabetes Mother   . Hypertension Mother   . Alcohol abuse Mother   . Diabetes Father   . Kidney disease Father   . Diabetes Sister   . Stroke Sister   . Diabetes Paternal Grandmother   . Cancer Paternal Grandfather        type unknown  . Colon cancer Neg Hx     Past Surgical History:  Procedure Laterality Date  . COLONOSCOPY    . HEMORRHOID BANDING    . ROBOT ASSISTED LAPAROSCOPIC NEPHRECTOMY Left 12/07/2017   Procedure: XI ROBOTIC ASSISTED LAPAROSCOPIC NEPHRECTOMY;  Surgeon: Cleon Gustin, MD;  Location: [facility];  Service: Urology;  Laterality: Left;   Social History   Occupational History    Employer: [employer]  Tobacco Use  . Smoking status: Former Smoker    Types: Cigarettes    Quit date: 12/03/2012    Years since quitting: 7.7  . Smokeless tobacco: Never Used  Vaping Use  . Vaping Use: Never used  Substance and Sexual Activity  . Alcohol use: Yes    Comment: Occasional  . 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 Sexual activity: Yes

## 2020-09-13 LAB — SYNOVIAL FLUID ANALYSIS, COMPLETE
Basophils, %: 1 % — ABNORMAL HIGH
Eosinophils-Synovial: 1 % (ref 0–2)
Lymphocytes-Synovial Fld: 66 % (ref 0–74)
Monocyte/Macrophage: 8 % (ref 0–69)
Neutrophil, Synovial: 21 % (ref 0–24)
Synoviocytes, %: 3 % (ref 0–15)
WBC, Synovial: 214 cells/uL — ABNORMAL HIGH (ref <150)

## 2020-09-22 ENCOUNTER — Other Ambulatory Visit (INDEPENDENT_AMBULATORY_CARE_PROVIDER_SITE_OTHER): Payer: Self-pay | Admitting: Primary Care

## 2020-09-22 DIAGNOSIS — E7841 Elevated Lipoprotein(a): Secondary | ICD-10-CM

## 2020-09-22 NOTE — Telephone Encounter (Signed)
Requested Prescriptions  Pending Prescriptions Disp Refills  . atorvastatin (LIPITOR) 40 MG tablet [Pharmacy Med Name: ATORVASTATIN 40MG  TABLETS] 90 tablet 0    Sig: TAKE 1 TABLET(40 MG) BY MOUTH DAILY     Cardiovascular:  Antilipid - Statins Failed - 09/22/2020 10:06 AM      Failed - LDL in normal range and within 360 days    LDL Chol Calc (NIH)  Date Value Ref Range Status  10/26/2019 92 0 - 99 mg/dL Final         Failed - HDL in normal range and within 360 days    HDL  Date Value Ref Range Status  10/26/2019 37 (L) >39 mg/dL Final         Passed - Total Cholesterol in normal range and within 360 days    Cholesterol, Total  Date Value Ref Range Status  10/26/2019 153 100 - 199 mg/dL Final         Passed - Triglycerides in normal range and within 360 days    Triglycerides  Date Value Ref Range Status  10/26/2019 137 0 - 149 mg/dL Final         Passed - Patient is not pregnant      Passed - Valid encounter within last 12 months    Recent Outpatient Visits          1 week ago Effusion of right knee   [place], Michelle P, NP   1 month ago Effusion of right knee   [place], Michelle P, NP   1 month ago Physical exam   [place] Juluis Mire P, NP   11 months ago Low back pain with right-sided sciatica, unspecified back pain laterality, unspecified chronicity   [facility] Kerin Perna, NP   1 year ago Vision changes   [place], [place], NP      Future Appointments            In 11 months McKenzie, Candee [place], MD [place]

## 2020-10-31 ENCOUNTER — Ambulatory Visit (HOSPITAL_COMMUNITY)
Admission: EM | Admit: 2020-10-31 | Discharge: 2020-10-31 | Disposition: A | Discharge status: Home / Self Care | Payer: Medicaid Other | Attending: Family Medicine | Admitting: Family Medicine

## 2020-10-31 ENCOUNTER — Other Ambulatory Visit: Payer: Self-pay

## 2020-10-31 ENCOUNTER — Encounter (HOSPITAL_COMMUNITY): Payer: Self-pay

## 2020-10-31 DIAGNOSIS — M7918 Myalgia, other site: Secondary | ICD-10-CM

## 2020-10-31 DIAGNOSIS — M545 Low back pain, unspecified: Secondary | ICD-10-CM

## 2020-10-31 MED ORDER — NAPROXEN 500 MG PO TABS
500.0000 mg | ORAL_TABLET | Freq: Two times a day (BID) | ORAL | 0 refills | Status: DC
Start: 2020-10-31 — End: 2021-01-28

## 2020-10-31 MED ORDER — PREDNISONE 20 MG PO TABS
40.0000 mg | ORAL_TABLET | Freq: Every day | ORAL | 0 refills | Status: DC
Start: 2020-10-31 — End: 2021-01-28

## 2020-10-31 NOTE — Discharge Instructions (Signed)

## 2020-10-31 NOTE — ED Triage Notes (Signed)
Pt reports having right sided middle back pain top the right buttocks and right leg x 3 days. Pt reports this pain happens every 3-4 months after he had the left kidney removed. Denies nausea, fever, dysuria.

## 2020-11-01 NOTE — ED Provider Notes (Signed)
[place]   [id number] 10/31/20 Arrival Time: 9326  ASSESSMENT & PLAN:  1. Right buttock pain   2. Acute right-sided low back pain without sciatica      Able to ambulate here and hemodynamically stable. No indication for imaging of back at this time given no trauma and normal neurological exam. Discussed.  Begin trial of: Meds ordered this encounter  Medications  . naproxen (NAPROSYN) 500 MG tablet    Sig: Take 1 tablet (500 mg total) by mouth 2 (two) times daily with a meal.    Dispense:  20 tablet    Refill:  0  . predniSONE (DELTASONE) 20 MG tablet    Sig: Take 2 tablets (40 mg total) by mouth daily.    Dispense:  10 tablet    Refill:  0   Encourage ROM/movement as tolerated.  Recommend:  Follow-up Information    Schedule an appointment as soon as possible for a visit  with Kerin Perna, NP.   Specialty: Internal Medicine Contact information: [address] [address] [place].   Why: If worsening or failing to improve as anticipated. Contact information: [address] [place] [phone]              Reviewed expectations re: course of current medical issues. Questions answered. Outlined signs and symptoms indicating need for more acute intervention. Patient verbalized understanding. After Visit Summary given.   SUBJECTIVE: History from: patient.  [place] Clark is a 59 y.o. male who presents with complaint of intermittent right sided lower back and buttock discomfort. Onset gradual. First noted 3 d ago. Injury/trama: no. History of back problems requiring medical care: occasional with similar symptoms. Pain described as aching and without specific radiation. Aggravating factors: certain movements and prolonged walking/standing. Alleviating factors: rest. Progressive LE weakness or saddle anesthesia: none. Extremity sensation  changes or weakness: none. Ambulatory without difficulty. Normal bowel/bladder habits: yes; without urinary retention. Normal PO intake without n/v. No associated abdominal pain/n/v. Self treatment: has OTC analgesics, with minimal relief.  Reports no chronic steroid use, fevers, IV drug use, or recent back surgeries or procedures.  ROS: As per HPI. All other systems negative.   OBJECTIVE:  Vitals:   10/31/20 1823  BP: (!) 152/103  Pulse: (!) 105  Resp: 18  Temp: 99.9 F (37.7 C)  TempSrc: Oral  SpO2: 95%    General appearance: alert; no distress HEENT: [place]; AT Neck: supple with FROM; without midline tenderness CV: regular Lungs: unlabored respirations; speaks full sentences without difficulty Abdomen: soft, non-tender; non-distended Back: mild  and poorly localized tenderness to palpation over R lumbar paraspinal musculature in addition to R upper buttock; FROM at waist; bruising: none; without midline tenderness Extremities: without edema; symmetrical without gross deformities; normal ROM of bilateral LE Skin: warm and dry Neurologic: normal gait; normal sensation and strength of RLE Psychological: alert and cooperative; normal mood and affect    No Known Allergies  Past Medical History:  Diagnosis Date  . Cancer ([place])    Left kidney  . Hyperlipidemia   . Internal hemorrhoids   . Polycystic liver disease   . Substance abuse ([place])    Social History   Socioeconomic History  . Marital status: Single    Spouse name: Not on file  . Number of children: 2  . Years of education: 9  . Highest education level: Not on  file  Occupational History    Employer: [employer]  Tobacco Use  . Smoking status: Former Smoker    Types: Cigarettes    Quit date: 12/03/2012    Years since quitting: 7.9  . Smokeless tobacco: Never Used  Vaping Use  . Vaping Use: Never used  Substance and Sexual Activity  . Alcohol use: Yes    Comment: Occasional  . Drug use: Yes    Types:  Marijuana    Comment: was addicted to cocaine for 23 yrs but clean now for about  12-13 yrs; last  marijuana  use was this mo rning   . Sexual activity: Yes  Other Topics Concern  . Not on file  Social History Narrative   Lives with girlfriend and 23 year old daughter.  Has 2 children.  Works at [employer].  Education: high school.    Social Determinants of Health   Financial Resource Strain:   . Difficulty of Paying Living Expenses: Not on file  Food Insecurity:   . Worried About Charity fundraiser in the Last Year: Not on file  . Ran Out of Food in the Last Year: Not on file  Transportation Needs:   . Lack of Transportation (Medical): Not on file  . Lack of Transportation (Non-Medical): Not on file  Physical Activity:   . Days of Exercise per Week: Not on file  . Minutes of Exercise per Session: Not on file  Stress:   . Feeling of Stress : Not on file  Social Connections:   . Frequency of Communication with Friends and Family: Not on file  . Frequency of Social Gatherings with Friends and Family: Not on file  . Attends Religious Services: Not on file  . Active Member of Clubs or Organizations: Not on file  . Attends Archivist Meetings: Not on file  . Marital Status: Not on file  Intimate Partner Violence:   . Fear of Current or Ex-Partner: Not on file  . Emotionally Abused: Not on file  . Physically Abused: Not on file  . Sexually Abused: Not on file   Family History  Problem Relation Age of Onset  . Diabetes Mother   . Hypertension Mother   . Alcohol abuse Mother   . Diabetes Father   . Kidney disease Father   . Diabetes Sister   . Stroke Sister   . Diabetes Paternal Grandmother   . Cancer Paternal Grandfather        type unknown  . Colon cancer Neg Hx    Past Surgical History:  Procedure Laterality Date  . COLONOSCOPY    . HEMORRHOID BANDING    . ROBOT ASSISTED LAPAROSCOPIC NEPHRECTOMY Left 12/07/2017   Procedure: XI ROBOTIC ASSISTED LAPAROSCOPIC  NEPHRECTOMY;  Surgeon: Cleon Gustin, MD;  Location: [facility];  Service: Urology;  Laterality: Left;     Vanessa Kick, MD 11/01/20 1130

## 2020-11-02 IMAGING — CT CT HEAD CODE STROKE
3 series · 15 of 47 positions shown, 18 images · non-contrast
Comparison: None.

CLINICAL DATA: Code stroke. Neuro deficit, acute stroke suspected.
Left facial numbness and facial droop.

EXAM:
CT HEAD WITHOUT CONTRAST
TECHNIQUE: Contiguous axial images were obtained from the base of the skull
through the vertex without intravenous contrast.

[Series 3: head 5.0 st · axial · 0.42mm/px · z∈[+1720,+1855]mm · 9 of 33 slices shown, 12 images]
[im 3/33  brain]
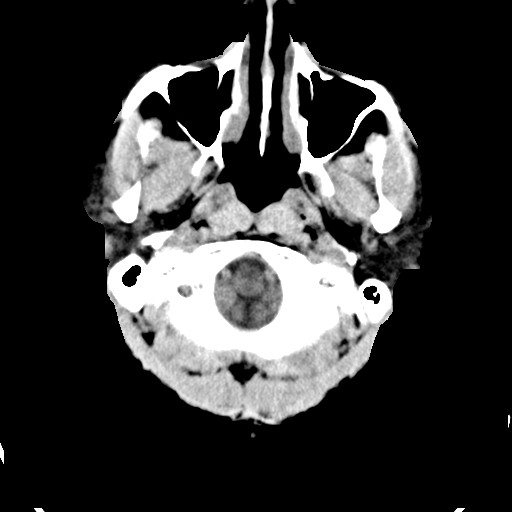
[im 3/33  bone]
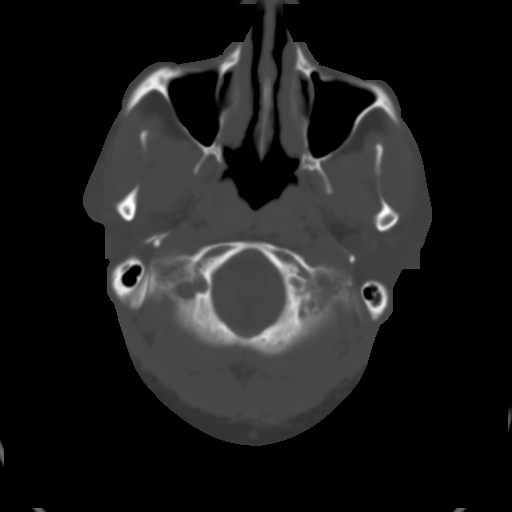
[im 6/33  brain]
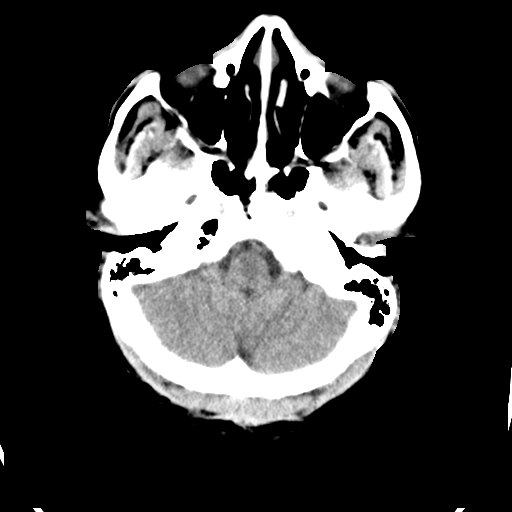
[im 9/33  brain]
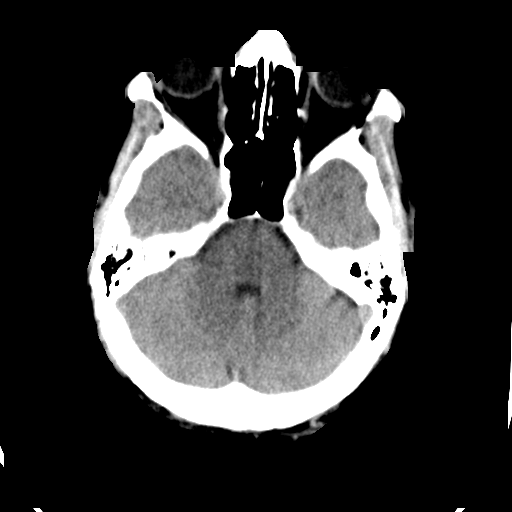
[im 13/33  brain]
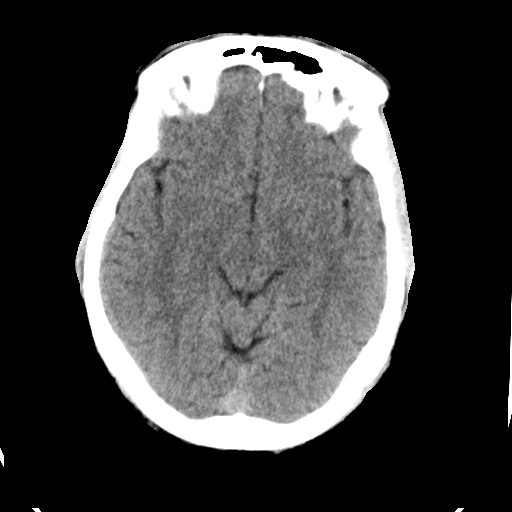
[im 17/33  brain]
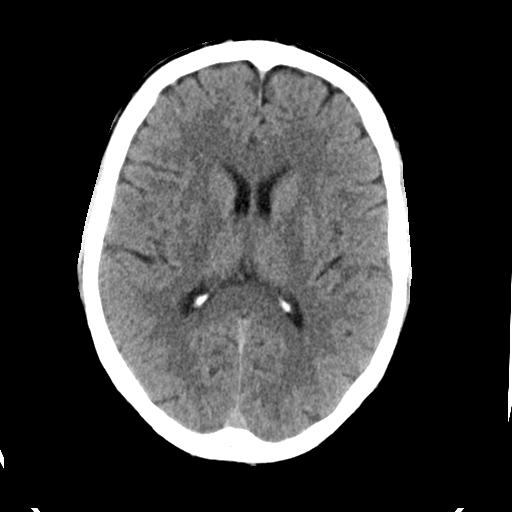
[im 17/33  bone]
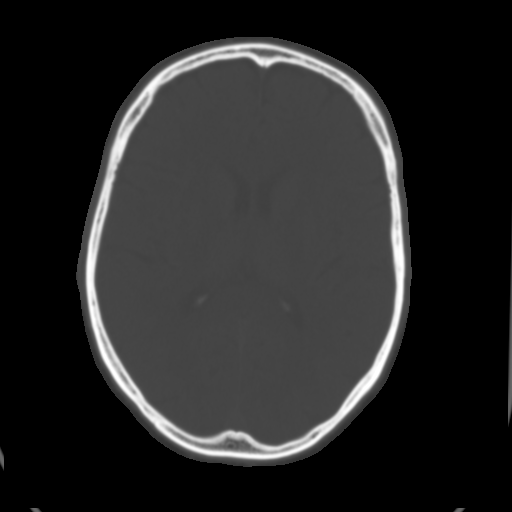
[im 20/33  brain]
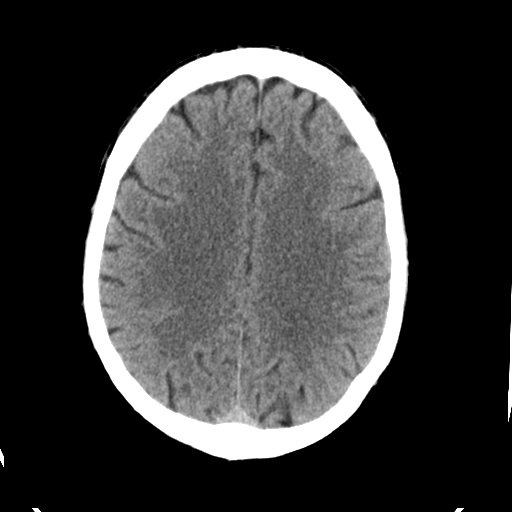
[im 24/33  brain]
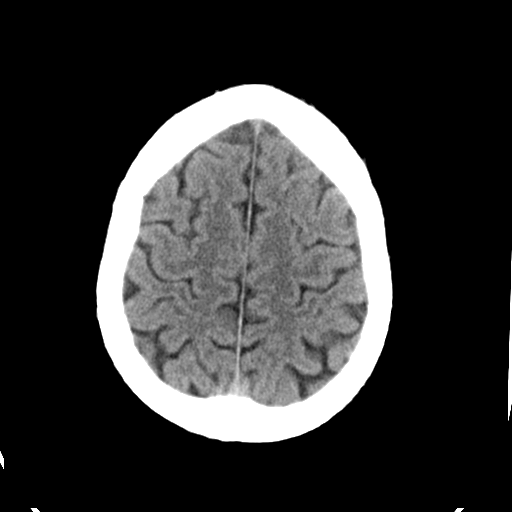
[im 27/33  brain]
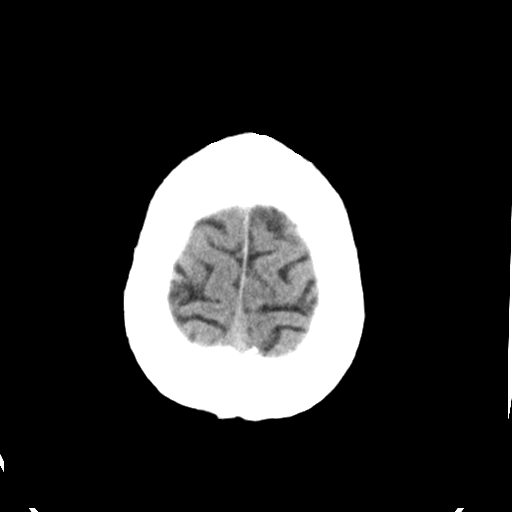
[im 30/33  brain]
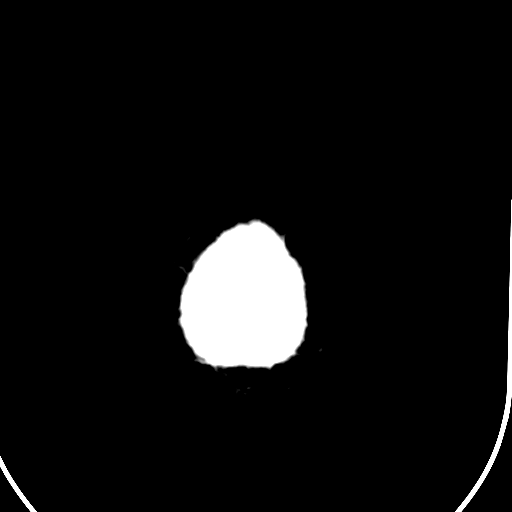
[im 30/33  bone]
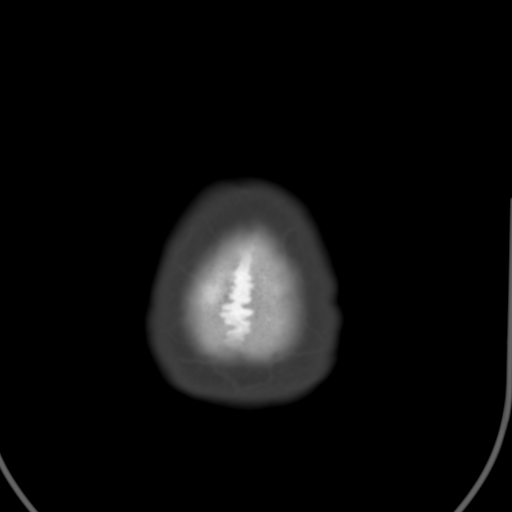

[Series 5: head 3.0 cor st · coronal · 0.31mm/px · 3 of 68 slices shown]
[im 23/68  brain]
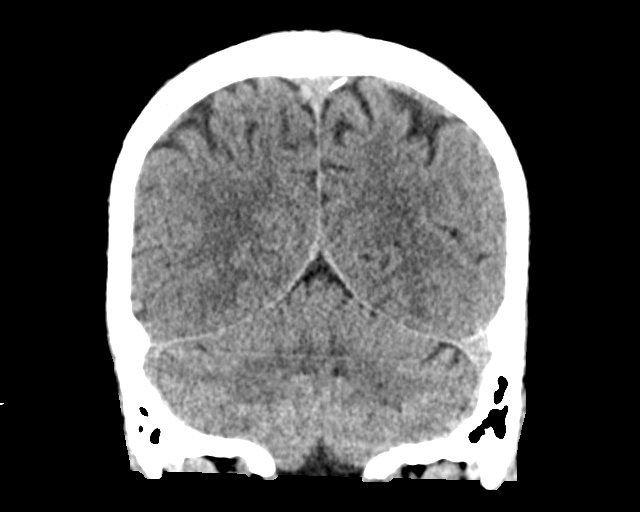
[im 30/68  brain]
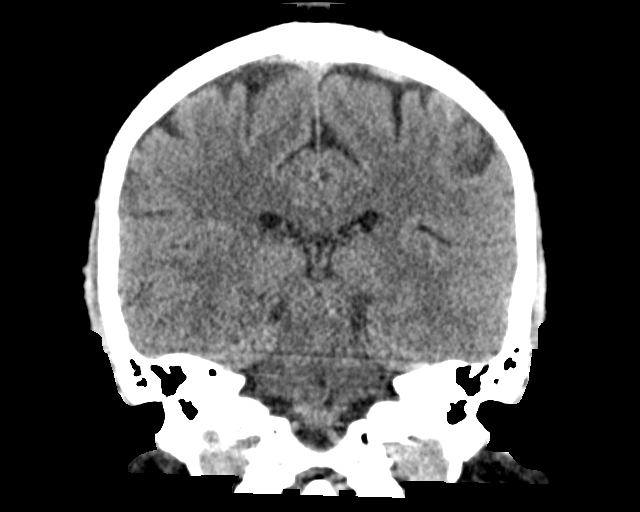
[im 38/68  brain]
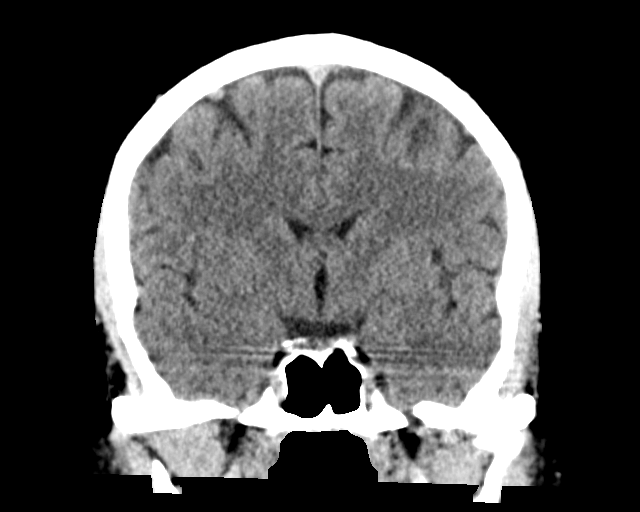

[Series 6: head 3.0 sag st · sagittal · 0.34mm/px · 3 of 67 slices shown]
[im 23/67  brain]
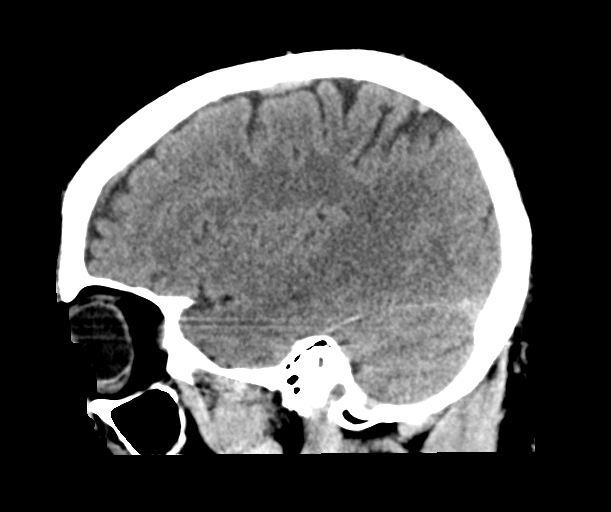
[im 34/67  brain]
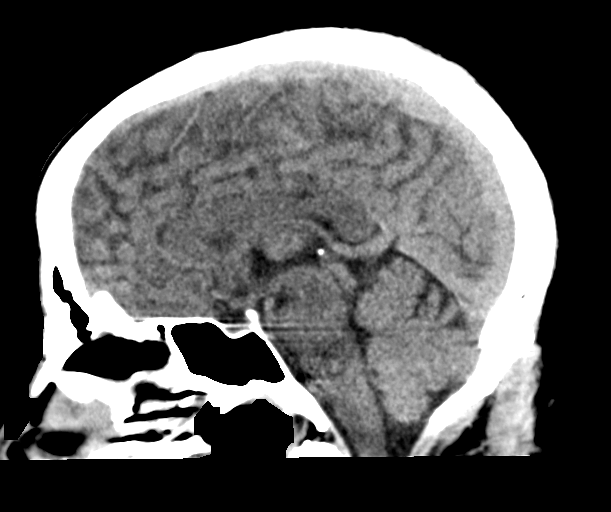
[im 45/67  brain]
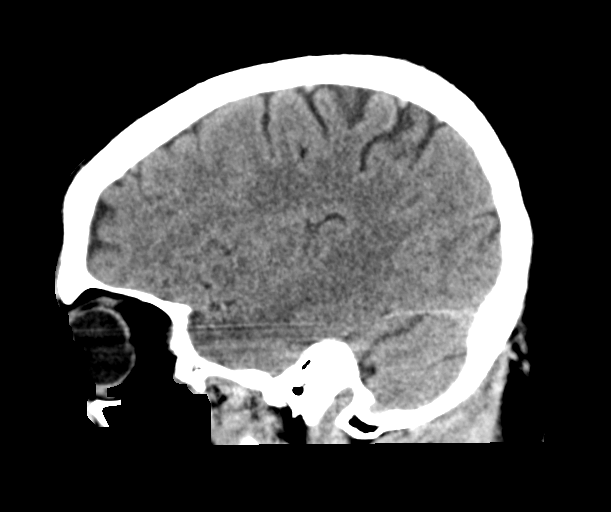

[15 of 47 positions shown; findings below may reference images not displayed]

FINDINGS: Brain: No acute infarct, hemorrhage, or mass lesion is present. No
significant white matter lesions are present. The ventricles are of
normal size. No significant extraaxial fluid collection is present.
The brainstem and cerebellum are within normal limits.

Vascular: No hyperdense vessel or unexpected calcification.

Skull: Calvarium is intact. No focal lytic or blastic lesions are
present. No significant extracranial soft tissue lesion is present.

Sinuses/Orbits: The paranasal sinuses and mastoid air cells are
clear. The globes and orbits are within normal limits.

ASPECTS (Alberta Stroke Program Early CT Score)

- Ganglionic level infarction (caudate, lentiform nuclei, internal
capsule, insula, M1-M3 cortex): [DATE]

- Supraganglionic infarction (M4-M6 cortex): [DATE]

Total score (0-10 with 10 being normal): [DATE]
IMPRESSION: 1. Normal CT of the head without contrast
2. ASPECTS is [DATE]

The above was relayed via text pager to Dr. Tohirjon on 07/13/2020

## 2020-11-09 ENCOUNTER — Ambulatory Visit (INDEPENDENT_AMBULATORY_CARE_PROVIDER_SITE_OTHER): Payer: Medicaid Other | Admitting: Primary Care

## 2020-11-14 ENCOUNTER — Other Ambulatory Visit: Payer: Self-pay

## 2020-11-14 ENCOUNTER — Ambulatory Visit (INDEPENDENT_AMBULATORY_CARE_PROVIDER_SITE_OTHER): Payer: Medicaid Other | Admitting: Primary Care

## 2020-11-14 ENCOUNTER — Encounter (INDEPENDENT_AMBULATORY_CARE_PROVIDER_SITE_OTHER): Payer: Self-pay | Admitting: Primary Care

## 2020-11-14 VITALS — BP 113/80 | HR 95 | Temp 97.3°F | Resp 16 | Wt 204.0 lb

## 2020-11-14 DIAGNOSIS — G8929 Other chronic pain: Secondary | ICD-10-CM | POA: Diagnosis not present

## 2020-11-14 DIAGNOSIS — M545 Low back pain, unspecified: Secondary | ICD-10-CM | POA: Diagnosis not present

## 2020-11-14 DIAGNOSIS — M25561 Pain in right knee: Secondary | ICD-10-CM

## 2020-11-14 NOTE — Progress Notes (Signed)
Established Patient Office Visit  Subjective:  Patient ID: [place] Clark, male    DOB: 1961/08/07  Age: 59 y.o. MRN: [id number]  CC:  Chief Complaint  Patient presents with   Back Pain   Knee Pain    HPI [name] is a 59 year old male who presents for chronic back and knee pain.  He presented to the ED on 10/31/2020 for intermittent right sided lower back and buttock discomfort. Onset gradual. Pain des Aggravating factors: certain movements and prolonged walking/standing. Alleviating factors: rest. Progressive LE weakness or saddle anesthesia: none. Extremity sensation changes or weakness: none. Ambulatory without difficulty. Normal bowel/bladder habits: yes; without urinary retention. Normal PO intake without n/v. No associated abdominal pain/n/v. Self treatment: has OTC analgesics, with minimal relief decribed as aching and with specific radiation to buttocks.   Past Medical History:  Diagnosis Date   Cancer ([place])    Left kidney   Hyperlipidemia    Internal hemorrhoids    Polycystic liver disease    Substance abuse ([place])     Past Surgical History:  Procedure Laterality Date   COLONOSCOPY     HEMORRHOID BANDING     ROBOT ASSISTED LAPAROSCOPIC NEPHRECTOMY Left 12/07/2017   Procedure: XI ROBOTIC ASSISTED LAPAROSCOPIC NEPHRECTOMY;  Surgeon: Cleon Gustin, MD;  Location: [facility];  Service: Urology;  Laterality: Left;    Family History  Problem Relation Age of Onset   Diabetes Mother    Hypertension Mother    Alcohol abuse Mother    Diabetes Father    Kidney disease Father    Diabetes Sister    Stroke Sister    Diabetes Paternal Grandmother    Cancer Paternal Grandfather        type unknown   Colon cancer Neg Hx     Social History   Socioeconomic History   Marital status: Single    Spouse name: Not on file   Number of children: 2   Years of education: 12   Highest education level: Not on file  Occupational History     Employer: [employer]  Tobacco Use   Smoking status: Former Smoker    Types: Cigarettes    Quit date: 12/03/2012    Years since quitting: 7.9   Smokeless tobacco: Never Used  Vaping Use   Vaping Use: Never used  Substance and Sexual Activity   Alcohol use: Yes    Comment: Occasional   Drug use: Yes    Types: Marijuana    Comment: was addicted to cocaine for 23 yrs but clean now for about  12-13 yrs; last  marijuana  use was this mo rning    Sexual activity: Yes  Other Topics Concern   Not on file  Social History Narrative   Lives with girlfriend and 14 year old daughter.  Has 2 children.  Works at [employer].  Education: high school.    Social Determinants of Health   Financial Resource Strain:    Difficulty of Paying Living Expenses: Not on file  Food Insecurity:    Worried About Charity fundraiser in the Last Year: Not on file   [employer] of Food in the Last Year: Not on file  Transportation Needs:    Lack of Transportation (Medical): Not on file   Lack of Transportation (Non-Medical): Not on file  Physical Activity:    Days of Exercise per Week: Not on file   Minutes of Exercise per Session: Not on file  Stress:  Feeling of Stress : Not on file  Social Connections:    Frequency of Communication with Friends and Family: Not on file   Frequency of Social Gatherings with Friends and Family: Not on file   Attends Religious Services: Not on file   Active Member of Clubs or Organizations: Not on file   Attends Archivist Meetings: Not on file   Marital Status: Not on file  Intimate Partner Violence:    Fear of Current or Ex-Partner: Not on file   Emotionally Abused: Not on file   Physically Abused: Not on file   Sexually Abused: Not on file    Outpatient Medications Prior to Visit  Medication Sig Dispense Refill   acetaminophen (TYLENOL) 500 MG tablet Take 1 tablet (500 mg total) by mouth every 6 (six) hours as needed. 30 tablet 0    atorvastatin (LIPITOR) 40 MG tablet TAKE 1 TABLET(40 MG) BY MOUTH DAILY 90 tablet 0   cyclobenzaprine (FLEXERIL) 10 MG tablet TAKE 1 TABLET(10 MG) BY MOUTH THREE TIMES DAILY AS NEEDED FOR MUSCLE SPASMS 90 tablet 2   gabapentin (NEURONTIN) 300 MG capsule TAKE 1 CAPSULE(300 MG) BY MOUTH TWICE DAILY 60 capsule 3   naproxen (NAPROSYN) 500 MG tablet Take 1 tablet (500 mg total) by mouth 2 (two) times daily with a meal. (Patient not taking: Reported on 11/14/2020) 20 tablet 0   predniSONE (DELTASONE) 20 MG tablet Take 2 tablets (40 mg total) by mouth daily. (Patient not taking: Reported on 11/14/2020) 10 tablet 0   No facility-administered medications prior to visit.    No Known Allergies  ROS Review of Systems  Musculoskeletal: Positive for back pain.       Knee pain  All other systems reviewed and are negative.     Objective:    BP 113/80    Pulse 95    Temp (!) 97.3 F (36.3 C)    Resp 16    Wt 204 lb (92.5 kg)    SpO2 97%    BMI 29.69 kg/m    General: Vital signs reviewed.  Patient is well-developed and well-nourished, in no acute distress and cooperative with exam.  Head: Normocephalic and atraumatic. Eyes: EOMI, conjunctivae normal, no scleral icterus.  Neck: Supple, trachea midline, normal ROM, no JVD, masses, thyromegaly, or carotid bruit present.  Cardiovascular: RRR, S1 normal, S2 normal, no murmurs, gallops, or rubs. Pulmonary/Chest: Clear to auscultation bilaterally, no wheezes, rales, or rhonchi. Abdominal: Soft, non-tender, non-distended, BS +, no masses, organomegaly, or guarding present.  Musculoskeletal: No joint deformities, erythema, but  stiffness, and decrease ROM and tender. Extremities: No lower extremity edema bilaterally,  pulses symmetric and intact bilaterally. No cyanosis or clubbing. Neurological: A&O x3, Strength is normal and symmetric bilaterally, cranial nerve II-XII are grossly intact, no focal motor deficit, sensory intact to light touch bilaterally.   Skin: Warm, dry and intact. No rashes or erythema. Psychiatric: Normal mood and affect. speech and behavior is normal. Cognition and memory are normal.   Health Maintenance Due  Topic Date Due   COVID-19 Vaccine (1) Never done    There are no preventive care reminders to display for this patient.  Lab Results  Component Value Date   TSH 0.602 07/27/2018   Lab Results  Component Value Date   WBC 4.9 07/13/2020   HGB 13.6 07/13/2020   HCT 40.0 07/13/2020   MCV 95.1 07/13/2020   PLT 296 07/13/2020   Lab Results  Component Value Date   NA 140  08/23/2020   K 4.6 08/23/2020   CO2 23 08/23/2020   GLUCOSE 80 08/23/2020   BUN 16 08/23/2020   CREATININE 1.73 (H) 08/23/2020   BILITOT 0.7 08/23/2020   ALKPHOS 108 08/23/2020   AST 23 08/23/2020   ALT 23 08/23/2020   PROT 7.7 08/23/2020   ALBUMIN 4.4 08/23/2020   CALCIUM 9.2 08/23/2020   ANIONGAP 10 07/13/2020   Lab Results  Component Value Date   CHOL 153 10/26/2019   Lab Results  Component Value Date   HDL 37 (L) 10/26/2019   Lab Results  Component Value Date   LDLCALC 92 10/26/2019   Lab Results  Component Value Date   TRIG 137 10/26/2019   Lab Results  Component Value Date   CHOLHDL 4.1 10/26/2019   No results found for: HGBA1C    Assessment & Plan:   Problem List Items Addressed This Visit   [place] was seen today for back pain and knee pain.  Diagnoses and all orders for this visit:  Chronic right-sided low back pain, unspecified whether sciatica present  BACK PAIN  Location: right mid back Quality: aching and uncomfortable Onset: sudden Worse with: sitting and bending     Better with: still Radiation: to butt check  Trauma: left kidney remove  Best sitting/standing/leaning forward: lying down  Red Flags Fecal/urinary incontinence: no  Numbness/Weakness: yes  Fever/chills/sweats: no  Night pain: no  Unexplained weight loss: no  No relief with bedrest: no  h/o cancer/immunosuppression: no   IV drug use: no  PMH of osteoporosis or chronic steroid use: no  -     Ambulatory referral to Orthopedic Surgery Appointment made with Dr. Marlou Sa November 28, 2020  Chronic pain of right knee -     Ambulatory referral to Orthopedic Surgery    Follow-up: Return if symptoms worsen or fail to improve.    Kerin Perna, NP

## 2020-11-14 NOTE — Progress Notes (Signed)
C/o pain in right side Out of work x 3-4 days.  Was told by MD at [facility] that he needs a MRI  Has right knee pain and swelling. Fluid was removed but does not know what is causing the pain.

## 2020-11-14 NOTE — Patient Instructions (Signed)
Acute Back Pain, Adult Acute back pain is sudden and usually short-lived. It is often caused by an injury to the muscles and tissues in the back. The injury may result from:  A muscle or ligament getting overstretched or torn (strained). Ligaments are tissues that connect bones to each other. Lifting something improperly can cause a back strain.  Wear and tear (degeneration) of the spinal disks. Spinal disks are circular tissue that provides cushioning between the bones of the spine (vertebrae).  Twisting motions, such as while playing sports or doing yard work.  A hit to the back.  Arthritis. You may have a physical exam, lab tests, and imaging tests to find the cause of your pain. Acute back pain usually goes away with rest and home care. Follow these instructions at home: Managing pain, stiffness, and swelling  Take over-the-counter and prescription medicines only as told by your health care provider.  Your health care provider may recommend applying ice during the first 24-48 hours after your pain starts. To do this: ? Put ice in a plastic bag. ? Place a towel between your skin and the bag. ? Leave the ice on for 20 minutes, 2-3 times a day.  If directed, apply heat to the affected area as often as told by your health care provider. Use the heat source that your health care provider recommends, such as a moist heat pack or a heating pad. ? Place a towel between your skin and the heat source. ? Leave the heat on for 20-30 minutes. ? Remove the heat if your skin turns bright red. This is especially important if you are unable to feel pain, heat, or cold. You have a greater risk of getting burned. Activity   Do not stay in bed. Staying in bed for more than 1-2 days can delay your recovery.  Sit up and stand up straight. Avoid leaning forward when you sit, or hunching over when you stand. ? If you work at a desk, sit close to it so you do not need to lean over. Keep your chin tucked  in. Keep your neck drawn back, and keep your elbows bent at a right angle. Your arms should look like the letter "L." ? Sit high and close to the steering wheel when you drive. Add lower back (lumbar) support to your car seat, if needed.  Take short walks on even surfaces as soon as you are able. Try to increase the length of time you walk each day.  Do not sit, drive, or stand in one place for more than 30 minutes at a time. Sitting or standing for long periods of time can put stress on your back.  Do not drive or use heavy machinery while taking prescription pain medicine.  Use proper lifting techniques. When you bend and lift, use positions that put less stress on your back: ? Bend your knees. ? Keep the load close to your body. ? Avoid twisting.  Exercise regularly as told by your health care provider. Exercising helps your back heal faster and helps prevent back injuries by keeping muscles strong and flexible.  Work with a physical therapist to make a safe exercise program, as recommended by your health care provider. Do any exercises as told by your physical therapist. Lifestyle  Maintain a healthy weight. Extra weight puts stress on your back and makes it difficult to have good posture.  Avoid activities or situations that make you feel anxious or stressed. Stress and anxiety increase muscle   tension and can make back pain worse. Learn ways to manage anxiety and stress, such as through exercise. General instructions  Sleep on a firm mattress in a comfortable position. Try lying on your side with your knees slightly bent. If you lie on your back, put a pillow under your knees.  Follow your treatment plan as told by your health care provider. This may include: ? Cognitive or behavioral therapy. ? Acupuncture or massage therapy. ? Meditation or yoga. Contact a health care provider if:  You have pain that is not relieved with rest or medicine.  You have increasing pain going down  into your legs or buttocks.  Your pain does not improve after 2 weeks.  You have pain at night.  You lose weight without trying.  You have a fever or chills. Get help right away if:  You develop new bowel or bladder control problems.  You have unusual weakness or numbness in your arms or legs.  You develop nausea or vomiting.  You develop abdominal pain.  You feel faint. Summary  Acute back pain is sudden and usually short-lived.  Use proper lifting techniques. When you bend and lift, use positions that put less stress on your back.  Take over-the-counter and prescription medicines and apply heat or ice as directed by your health care provider. This information is not intended to replace advice given to you by your health care provider. Make sure you discuss any questions you have with your health care provider. Document Revised: 03/29/2019 Document Reviewed: 07/22/2017 Elsevier Patient Education  2020 Elsevier Inc.  

## 2020-11-28 ENCOUNTER — Other Ambulatory Visit: Payer: Self-pay

## 2020-11-28 ENCOUNTER — Ambulatory Visit (INDEPENDENT_AMBULATORY_CARE_PROVIDER_SITE_OTHER): Payer: Medicaid Other

## 2020-11-28 ENCOUNTER — Ambulatory Visit (INDEPENDENT_AMBULATORY_CARE_PROVIDER_SITE_OTHER): Payer: Medicaid Other | Admitting: Orthopedic Surgery

## 2020-11-28 ENCOUNTER — Encounter: Payer: Self-pay | Admitting: Orthopedic Surgery

## 2020-11-28 VITALS — Ht 69.5 in | Wt 204.0 lb

## 2020-11-28 DIAGNOSIS — M545 Low back pain, unspecified: Secondary | ICD-10-CM

## 2020-11-28 DIAGNOSIS — M79604 Pain in right leg: Secondary | ICD-10-CM | POA: Diagnosis not present

## 2020-11-28 DIAGNOSIS — G8929 Other chronic pain: Secondary | ICD-10-CM

## 2020-11-28 DIAGNOSIS — Z85528 Personal history of other malignant neoplasm of kidney: Secondary | ICD-10-CM

## 2020-12-02 ENCOUNTER — Encounter: Payer: Self-pay | Admitting: Orthopedic Surgery

## 2020-12-02 NOTE — Progress Notes (Signed)
Office Visit Note   Patient: Garrett Clark           Date of Birth: 1961/04/13           MRN: [id number] Visit Date: 11/28/2020 Requested by: Kerin Perna, NP [address] [place],  [place] [id number] PCP: Kerin Perna, NP  Subjective: Chief Complaint  Patient presents with  . Lower Back - Pain  . Right Leg - Pain    HPI: Garrett Clark is a 59 y.o. male who presents to the office complaining of back pain.  Patient complains of back pain that has come and go over the last year.  He has had 3 episodes over the last year with the most recent episode lasting about 4 weeks now.  He complains of back pain that radiates into his buttocks and then all throughout his thigh on of the right side.  Notes groin pain on the right side as well.  Denies any history of back surgery or hip surgery.  Denies any history of knee surgery.  No subjective weakness.  He states that using a broom and mop makes his pain significantly worse.  He works as a Architectural technologist at [employer].  He had a prior MRI of the lumbar spine 2.5 years ago that showed no significant findings.  He does have history of renal cell carcinoma with only 1 kidney at this point.  He also reports continued right knee swelling.  He had an aspiration with injection on 09/12/2020.  He is taking gabapentin, muscle relaxer, Tylenol extra strength for his symptoms without significant relief..                ROS: All systems reviewed are negative as they relate to the chief complaint within the history of present illness.  Patient denies fevers or chills.  Assessment & Plan: Visit Diagnoses:  1. Chronic right-sided low back pain, unspecified whether sciatica present   2. Pain in right leg   3. History of renal cell cancer     Plan: Patient is a 59 year old male who complains of back pain.  He has had episodic back pain especially over the last year.  He has had 3 episodes this year with the most recent episode ongoing now  and has lasted for 4 weeks at this point.  He has pain that travels from his back into his right buttocks and into the right thigh diffusely.  Denies any numbness or tingling or leg weakness but his back pain is not responding to medications that he is taking.  Radiographs taken today and reviewed.  He does have a history of renal cell carcinoma and currently only has 1 kidney.  With this history of renal cell carcinoma as well as the chronic back pain over the last year that has not significantly improved, plan to order MRI of the lumbar spine to evaluate right radiculopathy.  Follow-up after MRI to review results.  Follow-Up Instructions: No follow-ups on file.   Orders:  Orders Placed This Encounter  Procedures  . XR Pelvis 1-2 Views  . XR Lumbar Spine 2-3 Views  . MR Lumbar Spine w/o contrast   No orders of the defined types were placed in this encounter.     Procedures: No procedures performed   Clinical Data: No additional findings.  Objective: Vital Signs: Ht 5' 9.5" (1.765 m)   Wt 204 lb (92.5 kg)   BMI 29.69 kg/m   Physical Exam:  Constitutional: Patient appears  well-developed HEENT:  Head: Normocephalic Eyes:EOM are normal Neck: Normal range of motion Cardiovascular: Normal rate Pulmonary/chest: Effort normal Neurologic: Patient is alert Skin: Skin is warm Psychiatric: Patient has normal mood and affect  Ortho Exam: Ortho exam demonstrates tenderness throughout the axial lumbar spine and paraspinal musculature.  Negative straight leg raise bilaterally.  5/5 motor strength of the bilateral hip flexors, quadricep, hamstring, dorsiflexion, plantarflexion.  Sensation intact through all dermatomes of the bilateral lower extremities.  No pain with hip range of motion.  No tenderness of the greater trochanter.  Specialty Comments:  No specialty comments available.  Imaging: No results found.   PMFS History: Patient Active Problem List   Diagnosis Date Noted  .  GERD (gastroesophageal reflux disease) 01/13/2019  . History of left radical nephrectomy 01/13/2019  . Renal cell carcinoma of left kidney ([place]) 12/07/2017  . Prolapsed internal hemorrhoids, grade 2 06/19/2017  . Pure hypercholesterolemia 03/30/2017  . History of cocaine abuse ([name]) 03/23/2017   Past Medical History:  Diagnosis Date  . Cancer ([place])    Left kidney  . Hyperlipidemia   . Internal hemorrhoids   . Polycystic liver disease   . Substance abuse ([place])     Family History  Problem Relation Age of Onset  . Diabetes Mother   . Hypertension Mother   . Alcohol abuse Mother   . Diabetes Father   . Kidney disease Father   . Diabetes Sister   . Stroke Sister   . Diabetes Paternal Grandmother   . Cancer Paternal Grandfather        type unknown  . Colon cancer Neg Hx     Past Surgical History:  Procedure Laterality Date  . COLONOSCOPY    . HEMORRHOID BANDING    . ROBOT ASSISTED LAPAROSCOPIC NEPHRECTOMY Left 12/07/2017   Procedure: XI ROBOTIC ASSISTED LAPAROSCOPIC NEPHRECTOMY;  Surgeon: Cleon Gustin, MD;  Location: [facility];  Service: Urology;  Laterality: Left;   Social History   Occupational History    Employer: [employer]  Tobacco Use  . Smoking status: Former Smoker    Types: Cigarettes    Quit date: 12/03/2012    Years since quitting: 8.0  . Smokeless tobacco: Never Used  Vaping Use  . Vaping Use: Never used  Substance and Sexual Activity  . Alcohol use: Yes    Comment: Occasional  . 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 Sexual activity: Yes

## 2020-12-13 ENCOUNTER — Other Ambulatory Visit: Payer: Self-pay | Admitting: Primary Care

## 2020-12-13 DIAGNOSIS — R202 Paresthesia of skin: Secondary | ICD-10-CM

## 2020-12-25 ENCOUNTER — Other Ambulatory Visit: Payer: Medicaid Other

## 2021-01-02 ENCOUNTER — Ambulatory Visit (HOSPITAL_COMMUNITY)
Admission: EM | Admit: 2021-01-02 | Discharge: 2021-01-02 | Disposition: A | Discharge status: Home / Self Care | Payer: Medicaid Other | Attending: Family Medicine | Admitting: Family Medicine

## 2021-01-02 ENCOUNTER — Encounter (HOSPITAL_COMMUNITY): Payer: Self-pay

## 2021-01-02 ENCOUNTER — Other Ambulatory Visit: Payer: Self-pay

## 2021-01-02 DIAGNOSIS — Z20822 Contact with and (suspected) exposure to covid-19: Secondary | ICD-10-CM | POA: Diagnosis not present

## 2021-01-02 DIAGNOSIS — R6883 Chills (without fever): Secondary | ICD-10-CM | POA: Diagnosis present

## 2021-01-02 DIAGNOSIS — U071 COVID-19: Secondary | ICD-10-CM | POA: Insufficient documentation

## 2021-01-02 LAB — SARS CORONAVIRUS 2 (TAT 6-24 HRS): SARS Coronavirus 2: POSITIVE — AB

## 2021-01-02 NOTE — ED Triage Notes (Signed)
Pt in with c/o having chills and sweats while at work on Monday but the sxs have now resolved  Pt states he tried to return to wrk and they stated he needs to been seen and have a work note

## 2021-01-02 NOTE — ED Provider Notes (Signed)
[facility]    CSN: [id number] Arrival date & time: 01/02/21  1316      History   Chief Complaint Chief Complaint  Patient presents with  . Chills  . Night Sweats    HPI Garrett Clark is a 60 y.o. male.   Garrett Clark presents with complaints of chills and sweating which started two days ago while at work. Yesterday started feeling better and today feels well. Work required him to seek evaluation before returning. Slight cough. No congestion. No sore throat. No ear pain. No gi symptoms. Brother with similar symptoms. Has had covid in the past. Has not been vaccinated for covid.    ROS per HPI, negative if not otherwise mentioned.      Past Medical History:  Diagnosis Date  . Cancer ([place])    Left kidney  . Hyperlipidemia   . Internal hemorrhoids   . Polycystic liver disease   . Substance abuse [facility])     Patient Active Problem List   Diagnosis Date Noted  . GERD (gastroesophageal reflux disease) 01/13/2019  . History of left radical nephrectomy 01/13/2019  . Renal cell carcinoma of left kidney ([place]) 12/07/2017  . Prolapsed internal hemorrhoids, grade 2 06/19/2017  . Pure hypercholesterolemia 03/30/2017  . History of cocaine abuse ([place]) 03/23/2017    Past Surgical History:  Procedure Laterality Date  . COLONOSCOPY    . HEMORRHOID BANDING    . ROBOT ASSISTED LAPAROSCOPIC NEPHRECTOMY Left 12/07/2017   Procedure: XI ROBOTIC ASSISTED LAPAROSCOPIC NEPHRECTOMY;  Surgeon: Cleon Gustin, MD;  Location: [facility];  Service: Urology;  Laterality: Left;       Home Medications    Prior to Admission medications   Medication Sig Start Date End Date Taking? Authorizing Provider  acetaminophen (TYLENOL) 500 MG tablet Take 1 tablet (500 mg total) by mouth every 6 (six) hours as needed. 08/09/20   Darr, Edison Nasuti, PA-C  atorvastatin (LIPITOR) 40 MG tablet TAKE 1 TABLET(40 MG) BY MOUTH DAILY 09/22/20   Kerin Perna, NP  cyclobenzaprine (FLEXERIL) 10  MG tablet TAKE 1 TABLET(10 MG) BY MOUTH THREE TIMES DAILY AS NEEDED FOR MUSCLE SPASMS 08/10/20   Charlott Rakes, MD  gabapentin (NEURONTIN) 300 MG capsule TAKE 1 CAPSULE(300 MG) BY MOUTH TWICE DAILY 12/13/20   Kerin Perna, NP  naproxen (NAPROSYN) 500 MG tablet Take 1 tablet (500 mg total) by mouth 2 (two) times daily with a meal. Patient not taking: Reported on 11/14/2020 10/31/20   Vanessa Kick, MD  predniSONE (DELTASONE) 20 MG tablet Take 2 tablets (40 mg total) by mouth daily. Patient not taking: Reported on 11/14/2020 10/31/20   Vanessa Kick, MD    Family History Family History  Problem Relation Age of Onset  . Diabetes Mother   . Hypertension Mother   . Alcohol abuse Mother   . Diabetes Father   . Kidney disease Father   . Diabetes Sister   . Stroke Sister   . Diabetes Paternal Grandmother   . Cancer Paternal Grandfather        type unknown  . Colon cancer Neg Hx     Social History Social History   Tobacco Use  . Smoking status: Former Smoker    Types: Cigarettes    Quit date: 12/03/2012    Years since quitting: 8.0  . Smokeless tobacco: Never Used  Vaping Use  . Vaping Use: Never used  Substance Use Topics  . Alcohol use: Yes    Comment: Occasional  .  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   Review of Systems Review of Systems   Physical Exam Triage Vital Signs ED Triage Vitals  Enc Vitals Group     BP 01/02/21 1343 123/81     Pulse Rate 01/02/21 1343 (!) 102     Resp 01/02/21 1343 19     Temp 01/02/21 1343 98.4 F (36.9 C)     Temp src --      SpO2 01/02/21 1343 96 %     Weight --      Height --      Head Circumference --      Peak Flow --      Pain Score 01/02/21 1342 0     Pain Loc --      Pain Edu? --      Excl. in [place]? --    No data found.  Updated Vital  Signs BP 123/81   Pulse (!) 102   Temp 98.4 F (36.9 C)   Resp 19   SpO2 96%   Visual Acuity Right Eye Distance:   Left Eye Distance:   Bilateral Distance:    Right Eye Near:   Left Eye Near:    Bilateral Near:     Physical Exam Constitutional:      Appearance: He is well-developed.  Cardiovascular:     Rate and Rhythm: Normal rate.  Pulmonary:     Effort: Pulmonary effort is normal.  Skin:    General: Skin is warm and dry.  Neurological:     Mental Status: He is alert and oriented to person, place, and time.      UC Treatments / Results  Labs (all labs ordered are listed, but only abnormal results are displayed) Labs Reviewed  SARS CORONAVIRUS 2 (TAT 6-24 HRS)    EKG   Radiology No results found.  Procedures Procedures (including critical care time)  Medications Ordered in UC Medications - No data to display  Initial Impression / Assessment and Plan / UC Course  I have reviewed the triage vital signs and the nursing notes.  Pertinent labs & imaging results that were available during my care of the patient were reviewed by me and considered in my medical decision making (see chart for details).     Non toxic. Benign physical exam.  History and physical consistent with viral illness.  Covid testing pending and isolation instructions provided.  Ok to return to work if negative. Return precautions provided. Patient verbalized understanding and agreeable to plan.   Final Clinical Impressions(s) / UC Diagnoses   Final diagnoses:  Encounter for laboratory testing for COVID-19 virus  Chills     Discharge Instructions     Self isolate until covid results are back.  We will notify you by phone if it is positive. Your negative results will be sent through your MyChart.    If it is positive you need to isolate from others for a total of 5 days. If no fever for 24 hours without medications, and symptoms improving you may end isolation on day 6, but wear a  mask if around any others for an additional 5 days.   Tylenol and/or ibuprofen as needed for pain or fevers.  If symptoms worsen or  do not improve in the next week to return to be seen or to follow up with your PCP.     ED Prescriptions    None     PDMP not reviewed this encounter.   Zigmund Gottron, NP 01/02/21 1422

## 2021-01-02 NOTE — Discharge Instructions (Signed)
Self isolate until covid results are back.  We will notify you by phone if it is positive. Your negative results will be sent through your MyChart.    If it is positive you need to isolate from others for a total of 5 days. If no fever for 24 hours without medications, and symptoms improving you may end isolation on day 6, but wear a mask if around any others for an additional 5 days.   Tylenol and/or ibuprofen as needed for pain or fevers.  If symptoms worsen or do not improve in the next week to return to be seen or to follow up with your PCP.

## 2021-01-03 ENCOUNTER — Telehealth: Payer: Self-pay

## 2021-01-03 NOTE — Telephone Encounter (Signed)
Patient stated he is returning a call to the nurse.  Tried to reach nurse, but she was on another call.  Patient would like a call back to discuss.

## 2021-01-03 NOTE — Telephone Encounter (Signed)
Pt. Called back. States he is feeling better, declines any further treatment.

## 2021-01-03 NOTE — Telephone Encounter (Signed)
Called to discuss with patient about COVID-19 symptoms and the use of one of the available treatments for those with mild to moderate Covid symptoms and at a high risk of hospitalization.  Pt appears to qualify for outpatient treatment due to co-morbid conditions and/or a member of an at-risk group in accordance with the FDA Emergency Use Authorization.    Symptom onset: 3 days ago. 12/31/20. Vaccinated: No Booster? No Immunocompromised? No Qualifiers: Renal cancer.  Unable to reach pt - Unable to reach pt.   Garrett Clark

## 2021-01-03 NOTE — Telephone Encounter (Signed)
Patient returned call- transferred to [facility] for follow up on previous call attempt

## 2021-01-08 ENCOUNTER — Other Ambulatory Visit (INDEPENDENT_AMBULATORY_CARE_PROVIDER_SITE_OTHER): Payer: Self-pay | Admitting: Primary Care

## 2021-01-08 DIAGNOSIS — E7841 Elevated Lipoprotein(a): Secondary | ICD-10-CM

## 2021-01-16 ENCOUNTER — Ambulatory Visit
Admission: RE | Admit: 2021-01-16 | Discharge: 2021-01-16 | Disposition: A | Discharge status: Home / Self Care | Payer: Medicaid Other | Source: Ambulatory Visit | Attending: Orthopedic Surgery | Admitting: Orthopedic Surgery

## 2021-01-16 ENCOUNTER — Other Ambulatory Visit: Payer: Self-pay

## 2021-01-16 DIAGNOSIS — M79604 Pain in right leg: Secondary | ICD-10-CM

## 2021-01-16 DIAGNOSIS — M545 Low back pain, unspecified: Secondary | ICD-10-CM

## 2021-01-16 DIAGNOSIS — G8929 Other chronic pain: Secondary | ICD-10-CM

## 2021-01-21 ENCOUNTER — Ambulatory Visit: Payer: Medicaid Other | Admitting: Orthopedic Surgery

## 2021-01-24 ENCOUNTER — Other Ambulatory Visit: Payer: Self-pay

## 2021-01-24 ENCOUNTER — Ambulatory Visit (INDEPENDENT_AMBULATORY_CARE_PROVIDER_SITE_OTHER): Payer: Medicaid Other | Admitting: Orthopedic Surgery

## 2021-01-24 ENCOUNTER — Telehealth: Payer: Self-pay

## 2021-01-24 ENCOUNTER — Ambulatory Visit (INDEPENDENT_AMBULATORY_CARE_PROVIDER_SITE_OTHER): Payer: Medicaid Other

## 2021-01-24 DIAGNOSIS — M545 Low back pain, unspecified: Secondary | ICD-10-CM

## 2021-01-24 DIAGNOSIS — M6528 Calcific tendinitis, other site: Secondary | ICD-10-CM

## 2021-01-24 DIAGNOSIS — M79671 Pain in right foot: Secondary | ICD-10-CM | POA: Diagnosis not present

## 2021-01-24 DIAGNOSIS — G8929 Other chronic pain: Secondary | ICD-10-CM

## 2021-01-24 NOTE — Telephone Encounter (Signed)
error 

## 2021-01-25 ENCOUNTER — Encounter: Payer: Self-pay | Admitting: Orthopedic Surgery

## 2021-01-25 NOTE — Progress Notes (Signed)
Office Visit Note   Patient: Garrett Clark           Date of Birth: 18-Jul-1961           MRN: [id number] Visit Date: 01/24/2021 Requested by: Kerin Perna, NP [address] [place],  [place] [address] PCP: Kerin Perna, NP  Subjective: Chief Complaint  Patient presents with   Other    Scan review    HPI: Garrett Clark is a 60 y.o. male who presents to the office complaining of back pain with radiation down into the right buttocks and right thigh. He returns to discuss results of the MRI lumbar spine. Patient notes continued symptoms from last visit with no significant change. Denies any weakness of bilateral legs. He does complain of pain throughout the right leg particularly with right knee pain and right ankle pain in addition to the back pain with radicular pain into the right hip. He notes a long history of right ankle pain which started when he was in prison in 2011 and felt a pop in his heel when he was playing basketball. He states that he did not have much confidence in the medical staff at the prison so he never raised concern about it. He states he has had continued swelling in the posterior heel since this incident and has had difficulty walking on the right leg with a limp at times due to pain..                ROS: All systems reviewed are negative as they relate to the chief complaint within the history of present illness.  Patient denies fevers or chills.  Assessment & Plan: Visit Diagnoses:  1. Calcific Achilles tendinitis   2. Pain in right foot     Plan: Patient is a 60 year old male who presents complaining of back pain with right radicular pain as well as right ankle pain. He is here to review MRI scan of the lumbar spine. MRI L-spine revealed subtle right foraminal disc protrusion at L4-L5 that is potentially irritating the right L4 nerve root as well as mild disc bulging and facet hypertrophy at L3-L4 and L5-S1. Mild reactive marrow edema at the  left L4-L5 facet from facet arthritis. Reviewed these findings and the images. Patient states that he has tried an exercise program including stretching daily that has not yielded great results. Plan to refer patient to Dr. Laurence Spates for trial of epidural steroid injections of the lumbar spine. Patient agreed with this plan.  Additionally, discussed patient's chronic right ankle pain. He localizes pain to the insertion of the Achilles tendon on the right calcaneus. He has soft tissue swelling and an antalgic gait. Achilles tendon is palpable and intact and he does have preserved plantarflexion strength. No other significant source of tenderness on exam. Radiographs reveal spur formation at the insertion of the Achilles tendon on the calcaneus. Recommended eccentric stretching, topical Voltaren, using a heel raise in his shoe to take pressure off of the Achilles insertion.  Follow-Up Instructions: No follow-ups on file.   Orders:  Orders Placed This Encounter  Procedures   XR Foot Complete Right   No orders of the defined types were placed in this encounter.     Procedures: No procedures performed   Clinical Data: No additional findings.  Objective: Vital Signs: There were no vitals taken for this visit.  Physical Exam:  Constitutional: Patient appears well-developed HEENT:  Head: Normocephalic Eyes:EOM are normal Neck: Normal range  of motion Cardiovascular: Normal rate Pulmonary/chest: Effort normal Neurologic: Patient is alert Skin: Skin is warm Psychiatric: Patient has normal mood and affect  Ortho Exam: Ortho exam demonstrates right ankle with swelling over the posterior aspect of the right heel at the Achilles tendon insertion. This is asymmetric compared to the contralateral side. He has tenderness over the Achilles tendon insertion that is point tender and moderate in intensity. Tendon is palpable and intact. 5/5 motor strength of right ankle dorsiflexion, plantarflexion,  inversion, eversion. No tenderness over the medial/lateral malleoli. No tenderness over the fifth metatarsal base, Lisfranc complex, posterior tibialis tendon insertion, plantar fascia. No calf tenderness. Negative Homans' sign. Walks with antalgic gait has right-sided.  Specialty Comments:  No specialty comments available.  Imaging: No results found.   PMFS History: Patient Active Problem List   Diagnosis Date Noted   GERD (gastroesophageal reflux disease) 01/13/2019   History of left radical nephrectomy 01/13/2019   Renal cell carcinoma of left kidney ([place]) 12/07/2017   Prolapsed internal hemorrhoids, grade 2 06/19/2017   Pure hypercholesterolemia 03/30/2017   History of cocaine abuse ([place]) 03/23/2017   Past Medical History:  Diagnosis Date   Cancer ([place])    Left kidney   Hyperlipidemia    Internal hemorrhoids    Polycystic liver disease    Substance abuse ([place])     Family History  Problem Relation Age of Onset   Diabetes Mother    Hypertension Mother    Alcohol abuse Mother    Diabetes Father    Kidney disease Father    Diabetes Sister    Stroke Sister    Diabetes Paternal Grandmother    Cancer Paternal Grandfather        type unknown   Colon cancer Neg Hx     Past Surgical History:  Procedure Laterality Date   COLONOSCOPY     HEMORRHOID BANDING     ROBOT ASSISTED LAPAROSCOPIC NEPHRECTOMY Left 12/07/2017   Procedure: XI ROBOTIC ASSISTED LAPAROSCOPIC NEPHRECTOMY;  Surgeon: Cleon Gustin, MD;  Location: [facility];  Service: Urology;  Laterality: Left;   Social History   Occupational History    Employer: [employer]  Tobacco Use   Smoking status: Former Smoker    Types: Cigarettes    Quit date: 12/03/2012    Years since quitting: 8.1   Smokeless tobacco: Never Used  Vaping Use   Vaping Use: Never used  Substance and Sexual Activity   Alcohol use: Yes    Comment: Occasional   Drug use: Yes    Types: Marijuana    Comment:  was addicted to cocaine for 23 yrs but clean now for about  12-13 yrs; last  marijuana  use was this mo rning    Sexual activity: Yes

## 2021-01-28 ENCOUNTER — Other Ambulatory Visit: Payer: Self-pay

## 2021-01-28 ENCOUNTER — Encounter (INDEPENDENT_AMBULATORY_CARE_PROVIDER_SITE_OTHER): Payer: Self-pay | Admitting: Primary Care

## 2021-01-28 ENCOUNTER — Ambulatory Visit (INDEPENDENT_AMBULATORY_CARE_PROVIDER_SITE_OTHER): Payer: Medicaid Other | Admitting: Primary Care

## 2021-01-28 DIAGNOSIS — M79602 Pain in left arm: Secondary | ICD-10-CM

## 2021-01-28 DIAGNOSIS — Z09 Encounter for follow-up examination after completed treatment for conditions other than malignant neoplasm: Secondary | ICD-10-CM | POA: Diagnosis not present

## 2021-01-28 DIAGNOSIS — M5441 Lumbago with sciatica, right side: Secondary | ICD-10-CM | POA: Diagnosis not present

## 2021-01-28 DIAGNOSIS — M79601 Pain in right arm: Secondary | ICD-10-CM | POA: Diagnosis not present

## 2021-01-28 DIAGNOSIS — R202 Paresthesia of skin: Secondary | ICD-10-CM

## 2021-01-28 MED ORDER — CYCLOBENZAPRINE HCL 10 MG PO TABS: ORAL_TABLET | ORAL | 2 refills | Status: DC

## 2021-01-28 MED ORDER — GABAPENTIN 300 MG PO CAPS: ORAL_CAPSULE | ORAL | 3 refills | Status: DC

## 2021-01-28 NOTE — Progress Notes (Signed)
Virtual Visit via Telephone Note  I connected with Garrett Clark on 01/28/21 at  2:10 PM EST by telephone and verified that I am speaking with the correct person using two identifiers.  Location: Patient: home  Provider: Kerin Perna @[facility]    I discussed the limitations, risks, security and privacy concerns of performing an evaluation and management service by telephone and the availability of in person appointments. I also discussed with the patient that there may be a patient responsible charge related to this service. The patient expressed understanding and agreed to proceed.   History of Present Illness: Garrett Clark is a 60 year old who presented to the urgent care on 01/02/2021 with complaints of cough, chills and sweating which started two days ago while at work.  He has not been vaccinated .  Covid test was positive and 11 months prior he was also positive for Covid.  He is having a Covid follow-up visit and also requesting a increase in his gabapentin.  He denies any chills fever fatigue or COVID related signs and symptoms.  He also voices chronic back pain lower sacrum area with spasms. Past Medical History:  Diagnosis Date  . Cancer ([place])    Left kidney  . Hyperlipidemia   . Internal hemorrhoids   . Polycystic liver disease   . Substance abuse ([place])    Current Outpatient Medications on File Prior to Visit  Medication Sig Dispense Refill  . acetaminophen (TYLENOL) 500 MG tablet Take 1 tablet (500 mg total) by mouth every 6 (six) hours as needed. 30 tablet 0  . atorvastatin (LIPITOR) 40 MG tablet TAKE 1 TABLET(40 MG) BY MOUTH DAILY 90 tablet 1   No current facility-administered medications on file prior to visit.     Observations/Objective: Pertinent positive and negative is noted in HPI  Assessment and Plan: Garrett Clark was seen today for covid positive.  Diagnoses and all orders for this visit:  Low back pain with right-sided sciatica, unspecified back pain  laterality, unspecified chronicity -     cyclobenzaprine (FLEXERIL) 10 MG tablet; TAKE 1 TABLET(10 MG) BY MOUTH THREE TIMES DAILY AS NEEDED FOR MUSCLE SPASMS  Paresthesia and pain of both upper extremities -     gabapentin (NEURONTIN) 300 MG capsule; Take 1 capsule 300mg  by mouth twice dailyTAKE 1 CAPSULE(300 MG) BY [facility] discharge follow-up Urgent care follow-up due to Covid positive.  All signs and symptoms have resolved.   Follow Up Instructions:    I discussed the assessment and treatment plan with the patient. The patient was provided an opportunity to ask questions and all were answered. The patient agreed with the plan and demonstrated an understanding of the instructions.   The patient was advised to call back or seek an in-person evaluation if the symptoms worsen or if the condition fails to improve as anticipated.  I provided 20 minutes of non-face-to-face time during this encounter.   Kerin Perna, NP

## 2021-01-28 NOTE — Progress Notes (Signed)
Pt is no longer having symptoms Would like to know if gabapentin can be increased

## 2021-01-31 ENCOUNTER — Ambulatory Visit: Payer: Medicaid Other | Admitting: Surgical

## 2021-02-13 ENCOUNTER — Encounter: Payer: Self-pay | Admitting: Physical Medicine and Rehabilitation

## 2021-02-13 ENCOUNTER — Other Ambulatory Visit: Payer: Self-pay

## 2021-02-13 ENCOUNTER — Ambulatory Visit (INDEPENDENT_AMBULATORY_CARE_PROVIDER_SITE_OTHER): Payer: Medicaid Other | Admitting: Physical Medicine and Rehabilitation

## 2021-02-13 ENCOUNTER — Ambulatory Visit: Payer: Self-pay

## 2021-02-13 VITALS — BP 120/81 | HR 94

## 2021-02-13 DIAGNOSIS — M5416 Radiculopathy, lumbar region: Secondary | ICD-10-CM

## 2021-02-13 DIAGNOSIS — M5116 Intervertebral disc disorders with radiculopathy, lumbar region: Secondary | ICD-10-CM

## 2021-02-13 MED ORDER — METHYLPREDNISOLONE ACETATE 80 MG/ML IJ SUSP
80.0000 mg | Freq: Once | INTRAMUSCULAR | Status: AC
  Administered 2021-02-13: 80 mg

## 2021-02-13 NOTE — Progress Notes (Signed)
Pt state lower back pain. Pt state walking and standing makes the pain worse. Pt state when getting up from a chair after sitting for a long time he can feel pain when he stand up. Pt state he take pain meds to help ease his pain.  Numeric Pain Rating Scale and Functional Assessment Average Pain 5   In the last MONTH (on 0-10 scale) has pain interfered with the following?  1. General activity like being  able to carry out your everyday physical activities such as walking, climbing stairs, carrying groceries, or moving a chair?  Rating(10)   +Driver, -BT, -Dye Allergies.

## 2021-02-13 NOTE — Patient Instructions (Signed)

## 2021-03-11 ENCOUNTER — Other Ambulatory Visit: Payer: Self-pay

## 2021-03-11 ENCOUNTER — Ambulatory Visit (INDEPENDENT_AMBULATORY_CARE_PROVIDER_SITE_OTHER): Payer: Medicaid Other | Admitting: Orthopedic Surgery

## 2021-03-11 DIAGNOSIS — M545 Low back pain, unspecified: Secondary | ICD-10-CM | POA: Diagnosis not present

## 2021-03-11 DIAGNOSIS — G8929 Other chronic pain: Secondary | ICD-10-CM

## 2021-03-11 NOTE — Progress Notes (Signed)
DAO MEMMOTT - 60 y.o. male MRN [id number]  Date of birth: [birth year]  Office Visit Note: Visit Date: 02/13/2021 PCP: Kerin Perna, NP Referred by: Kerin Perna, NP  Subjective: Chief Complaint  Patient presents with  . Lower Back - Pain   HPI:  JAHMIL MACLEOD is a 60 y.o. male who comes in today at the request of Dr. Anderson Malta for planned Right L4-L5 Lumbar epidural steroid injection with fluoroscopic guidance.  The patient has failed conservative care including home exercise, medications, time and activity modification.  This injection will be diagnostic and hopefully therapeutic.  Please see requesting physician notes for further details and justification. MRI reviewed with images and spine model.  MRI reviewed in the note below.    ROS Otherwise per HPI.  Assessment & Plan: Visit Diagnoses:    ICD-10-CM   1. Lumbar radiculopathy  M54.16 XR C-ARM NO REPORT    Epidural Steroid injection    methylPREDNISolone acetate (DEPO-MEDROL) injection 80 mg  2. Radiculopathy due to lumbar intervertebral disc disorder  M51.16 XR C-ARM NO REPORT    Epidural Steroid injection    methylPREDNISolone acetate (DEPO-MEDROL) injection 80 mg    Plan: No additional findings.   Meds & Orders:  Meds ordered this encounter  Medications  . methylPREDNISolone acetate (DEPO-MEDROL) injection 80 mg    Orders Placed This Encounter  Procedures  . XR C-ARM NO REPORT  . Epidural Steroid injection    Follow-up: Return for visit to requesting physician as needed.   Procedures: No procedures performed  Lumbosacral Transforaminal Epidural Steroid Injection - Sub-Pedicular Approach with Fluoroscopic Guidance  Patient: ALEXSIS BRANSCOM      Date of Birth: [birth year] MRN: [id number] PCP: Kerin Perna, NP      Visit Date: 02/13/2021   Universal Protocol:    Date/Time: 02/13/2021  Consent Given By: the patient  Position: PRONE  Additional Comments: Vital signs were  monitored before and after the procedure. Patient was prepped and draped in the usual sterile fashion. The correct patient, procedure, and site was verified.   Injection Procedure Details:   Procedure diagnoses: Lumbar radiculopathy [M54.16]    Meds Administered:  Meds ordered this encounter  Medications  . methylPREDNISolone acetate (DEPO-MEDROL) injection 80 mg    Laterality: Right  Location/Site:  L4-L5  Needle:5.0 in., 22 ga.  Short bevel or Quincke spinal needle  Needle Placement: Transforaminal  Findings:    -Comments: Excellent flow of contrast along the nerve, nerve root and into the epidural space.  Procedure Details: After squaring off the end-plates to get a true AP view, the C-arm was positioned so that an oblique view of the foramen as noted above was visualized. The target area is just inferior to the "nose of the scotty dog" or sub pedicular. The soft tissues overlying this structure were infiltrated with 2-3 ml. of 1% Lidocaine without Epinephrine.  The spinal needle was inserted toward the target using a "trajectory" view along the fluoroscope beam.  Under AP and lateral visualization, the needle was advanced so it did not puncture dura and was located close the 6 O'Clock position of the pedical in AP tracterory. Biplanar projections were used to confirm position. Aspiration was confirmed to be negative for CSF and/or blood. A 1-2 ml. volume of Isovue-250 was injected and flow of contrast was noted at each level. Radiographs were obtained for documentation purposes.   After attaining the desired flow of contrast documented above, a 0.5 to  1.0 ml test dose of 0.25% Marcaine was injected into each respective transforaminal space.  The patient was observed for 90 seconds post injection.  After no sensory deficits were reported, and normal lower extremity motor function was noted,   the above injectate was administered so that equal amounts of the injectate were placed  at each foramen (level) into the transforaminal epidural space.   Additional Comments:  The patient tolerated the procedure well Dressing: 2 x 2 sterile gauze and Band-Aid    Post-procedure details: Patient was observed during the procedure. Post-procedure instructions were reviewed.  Patient left the clinic in stable condition.      Clinical History: MRI LUMBAR SPINE WITHOUT CONTRAST  TECHNIQUE: Multiplanar, multisequence MR imaging of the lumbar spine was performed. No intravenous contrast was administered.  COMPARISON:  Previous radiograph from 11/28/2020 as well as prior MRI from 05/10/2018.  FINDINGS: Segmentation: Standard. Lowest well-formed disc space labeled the L5-S1 level.  Alignment: Physiologic with preservation of the normal lumbar lordosis. No listhesis.  Vertebrae: Vertebral body height maintained without acute or chronic fracture. Bone marrow signal intensity within normal limits. No discrete or worrisome osseous lesions. Mild reactive marrow edema noted about the left L4-5 facet due to facet arthritis (series 4, image 13). No other abnormal marrow edema.  Conus medullaris and cauda equina: Conus extends to the T12-L1 level. Conus and cauda equina appear normal.  Paraspinal and other soft tissues: Paraspinous soft tissues within normal limits. Few scattered benign appearing T2 hyperintense simple cyst noted about the right kidney. 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 Few additional scattered simple cyst noted within the visualized liver. Visualized visceral structures and retroperitoneal contents otherwise unremarkable.  Disc levels:  L1-2:  Unremarkable.  L2-3: Normal interspace. Minimal facet hypertrophy. No stenosis or impingement.  L3-4: Degenerative intervertebral disc space narrowing with diffuse disc bulge and disc desiccation. Mild facet and ligament flavum hypertrophy. No significant spinal stenosis. Mild  bilateral L3 foraminal narrowing. No frank impingement.  L4-5: Minimal disc bulge. Subtle right foraminal to extraforaminal disc protrusion contacts the exiting right L4 nerve root as it courses of the right neural foramen (series 7, image 29). Finding could contribute to right-sided radicular symptoms. Mild facet and ligament flavum hypertrophy. No significant spinal stenosis. Mild bilateral L4 foraminal narrowing.  L5-S1: Mild diffuse disc bulge with disc desiccation and intervertebral disc space narrowing. Minimal facet hypertrophy. Epidural lipomatosis. No canal or lateral recess stenosis. Foramina remain patent.  IMPRESSION: 1. Subtle right foraminal to extraforaminal disc protrusion at L4-5, contacting and potentially irritating the exiting right L4 nerve root. Finding could contribute to right-sided radicular symptoms. 2. Additional mild disc bulging and facet hypertrophy at L3-4 and L5-S1 without significant stenosis or impingement. 3. Mild reactive marrow edema about the left L4-5 facet due to facet arthritis. Finding could serve as a source for lower back pain.   Electronically Signed   By: Jeannine Boga M.D.   On: 01/17/2021 05:22     Objective:  VS:  HT:    WT:   BMI:     BP:120/81  HR:94bpm  TEMP: ( )  RESP:  Physical Exam Vitals and nursing note reviewed.  Constitutional:      General: He is not in acute distress.    Appearance: Normal appearance. He is not ill-appearing.  HENT:     Head: Normocephalic and atraumatic.     Right Ear: External ear normal.     Left Ear: External ear normal.  Nose: No congestion.  Eyes:     Extraocular Movements: Extraocular movements intact.  Cardiovascular:     Rate and Rhythm: Normal rate.     Pulses: Normal pulses.  Pulmonary:     Effort: Pulmonary effort is normal. No respiratory distress.  Abdominal:     General: There is no distension.     Palpations: Abdomen is soft.  Musculoskeletal:         General: No tenderness or signs of injury.     Cervical back: Neck supple.     Right lower leg: No edema.     Left lower leg: No edema.     Comments: Patient has good distal strength without clonus.  Skin:    Findings: No erythema or rash.  Neurological:     General: No focal deficit present.     Mental Status: He is alert and oriented to person, place, and time.     Sensory: No sensory deficit.     Motor: No weakness or abnormal muscle tone.     Coordination: Coordination normal.  Psychiatric:        Mood and Affect: Mood normal.        Behavior: Behavior normal.      Imaging: No results found.

## 2021-03-11 NOTE — Procedures (Signed)
Lumbosacral Transforaminal Epidural Steroid Injection - Sub-Pedicular Approach with Fluoroscopic Guidance  Patient: Garrett Clark      Date of Birth: 1961-04-27 MRN: [id number] PCP: Kerin Perna, NP      Visit Date: 02/13/2021   Universal Protocol:    Date/Time: 02/13/2021  Consent Given By: the patient  Position: PRONE  Additional Comments: Vital signs were monitored before and after the procedure. Patient was prepped and draped in the usual sterile fashion. The correct patient, procedure, and site was verified.   Injection Procedure Details:   Procedure diagnoses: Lumbar radiculopathy [M54.16]    Meds Administered:  Meds ordered this encounter  Medications  . methylPREDNISolone acetate (DEPO-MEDROL) injection 80 mg    Laterality: Right  Location/Site:  L4-L5  Needle:5.0 in., 22 ga.  Short bevel or Quincke spinal needle  Needle Placement: Transforaminal  Findings:    -Comments: Excellent flow of contrast along the nerve, nerve root and into the epidural space.  Procedure Details: After squaring off the end-plates to get a true AP view, the C-arm was positioned so that an oblique view of the foramen as noted above was visualized. The target area is just inferior to the "nose of the scotty dog" or sub pedicular. The soft tissues overlying this structure were infiltrated with 2-3 ml. of 1% Lidocaine without Epinephrine.  The spinal needle was inserted toward the target using a "trajectory" view along the fluoroscope beam.  Under AP and lateral visualization, the needle was advanced so it did not puncture dura and was located close the 6 O'Clock position of the pedical in AP tracterory. Biplanar projections were used to confirm position. Aspiration was confirmed to be negative for CSF and/or blood. A 1-2 ml. volume of Isovue-250 was injected and flow of contrast was noted at each level. Radiographs were obtained for documentation purposes.   After attaining the  desired flow of contrast documented above, a 0.5 to 1.0 ml test dose of 0.25% Marcaine was injected into each respective transforaminal space.  The patient was observed for 90 seconds post injection.  After no sensory deficits were reported, and normal lower extremity motor function was noted,   the above injectate was administered so that equal amounts of the injectate were placed at each foramen (level) into the transforaminal epidural space.   Additional Comments:  The patient tolerated the procedure well Dressing: 2 x 2 sterile gauze and Band-Aid    Post-procedure details: Patient was observed during the procedure. Post-procedure instructions were reviewed.  Patient left the clinic in stable condition.

## 2021-03-12 ENCOUNTER — Encounter: Payer: Self-pay | Admitting: Orthopedic Surgery

## 2021-03-12 ENCOUNTER — Telehealth: Payer: Self-pay | Admitting: Physical Medicine and Rehabilitation

## 2021-03-12 NOTE — Telephone Encounter (Signed)
Garrett Clark with [facility] [employer] called stating they will need clinical notes faxed over with the reference # attached, in order for them to complete an authorization.   Reference# [id number]  Fax# [phone]

## 2021-03-12 NOTE — Progress Notes (Signed)
Office Visit Note   Patient: Garrett Clark           Date of Birth: 1961/01/29           MRN: [id number] Visit Date: 03/11/2021 Requested by: Kerin Perna, NP [address] [place],  [place] [address] PCP: Kerin Perna, NP  Subjective: Chief Complaint  Patient presents with  . Lower Back - Pain, Follow-up    HPI: Garrett Clark is a 60 year old patient with low back and right leg pain.  He does have a right L4-5 extraforaminal disc protrusion.  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  He also reports some right heel pain.  He does have some significant insertional tendinitis of the Achilles tendon.  Injection did help him back in February.  He states he still walks with a limp but that is primarily due to his insertional Achilles tendinitis.              ROS: All systems reviewed are negative as they relate to the chief complaint within the history of present illness.  Patient denies  fevers or chills.   Assessment & Plan: Visit Diagnoses:  1. Chronic right-sided low back pain, unspecified whether sciatica present     Plan: Impression is right L4-5 extraforaminal disc protrusion.  Plan is to repeat epidural steroid injection x1 and then start physical therapy here a week after the injection for 1-2 times a week for 3 weeks for stretching and strengthening.  I can see him back about 6 weeks after the injection and the decision point at that time will be for or against surgical consultation for the back pain.  He also has insertional tendinitis and some bony prominence at the posterior heel on the right-hand side.  Discussed with him that typically the treatment for that is Achilles attachment debridement and then reattachment of the Achilles tendon which has a complication of infection as well as about a year recovery.  He is to hold off on intervention for the right heel for now.  Follow-Up Instructions: No follow-ups on file.   Orders:  Orders Placed This Encounter  Procedures   . Ambulatory referral to Physical Medicine Rehab  . Ambulatory referral to Physical Therapy   No orders of the defined types were placed in this encounter.     Procedures: No procedures performed   Clinical Data: No additional findings.  Objective: Vital Signs: There were no vitals taken for this visit.  Physical Exam:   Constitutional: Patient appears well-developed HEENT:  Head: Normocephalic Eyes:EOM are normal Neck: Normal range of motion Cardiovascular: Normal rate Pulmonary/chest: Effort normal Neurologic: Patient is alert Skin: Skin is warm Psychiatric: Patient has normal mood and affect    Ortho Exam: Ortho exam demonstrates palpable pedal pulses.  Does have prominence and tenderness at the Achilles insertion on the right compared to the left.  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  Heel cord slightly tight on the right-hand side with only about 10 degrees of ankle dorsiflexion passively.  Plantarflexion strength is 5+ out of 5 both ankles.  No nerve root tension signs today.  Does have some paresthesias L4-5 distribution right versus left.  No muscle atrophy.  Excellent ankle dorsiflexion plantarflexion quad hamstring and hip flexion strength.  Specialty Comments:  No specialty comments available.  Imaging: No results found.   PMFS History: Patient Active Problem List   Diagnosis Date Noted  . GERD (gastroesophageal reflux disease) 01/13/2019  . History of left radical nephrectomy 01/13/2019  .  Renal cell carcinoma of left kidney ([place]) 12/07/2017  . Prolapsed internal hemorrhoids, grade 2 06/19/2017  . Pure hypercholesterolemia 03/30/2017  . History of cocaine abuse ([place]) 03/23/2017   Past Medical History:  Diagnosis Date  . Cancer ([facility])    Left kidney  . Hyperlipidemia   . Internal hemorrhoids   . Polycystic liver disease   . Substance abuse ([place])     Family History  Problem Relation Age of Onset  . Diabetes Mother   . Hypertension Mother    . Alcohol abuse Mother   . Diabetes Father   . Kidney disease Father   . Diabetes Sister   . Stroke Sister   . Diabetes Paternal Grandmother   . Cancer Paternal Grandfather        type unknown  . Colon cancer Neg Hx     Past Surgical History:  Procedure Laterality Date  . COLONOSCOPY    . HEMORRHOID BANDING    . ROBOT ASSISTED LAPAROSCOPIC NEPHRECTOMY Left 12/07/2017   Procedure: XI ROBOTIC ASSISTED LAPAROSCOPIC NEPHRECTOMY;  Surgeon: Cleon Gustin, MD;  Location: [facility];  Service: Urology;  Laterality: Left;   Social History   Occupational History    Employer: [employer]  Tobacco Use  . Smoking status: Former Smoker    Types: Cigarettes    Quit date: 12/03/2012    Years since quitting: 8.2  . Smokeless tobacco: Never Used  Vaping Use  . Vaping Use: Never used  Substance and Sexual Activity  . Alcohol use: Yes    Comment: Occasional  . 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 Sexual activity: Yes

## 2021-03-14 NOTE — Telephone Encounter (Signed)
Sent over Clinic Notes

## 2021-04-02 ENCOUNTER — Ambulatory Visit: Payer: Medicaid Other

## 2021-04-08 ENCOUNTER — Ambulatory Visit: Payer: Medicaid Other | Admitting: Physical Medicine and Rehabilitation

## 2021-04-17 ENCOUNTER — Telehealth: Payer: Self-pay | Admitting: Physical Medicine and Rehabilitation

## 2021-04-17 NOTE — Telephone Encounter (Signed)
Patient called. He would like to [facility] appointment with Dr. Ernestina Patches.

## 2021-04-17 NOTE — Telephone Encounter (Signed)
Called pt and r/s 

## 2021-04-24 ENCOUNTER — Ambulatory Visit: Payer: Medicaid Other

## 2021-05-01 ENCOUNTER — Other Ambulatory Visit: Payer: Self-pay

## 2021-05-01 ENCOUNTER — Ambulatory Visit (INDEPENDENT_AMBULATORY_CARE_PROVIDER_SITE_OTHER): Payer: Medicaid Other | Admitting: Physical Medicine and Rehabilitation

## 2021-05-01 ENCOUNTER — Encounter: Payer: Self-pay | Admitting: Physical Medicine and Rehabilitation

## 2021-05-01 ENCOUNTER — Ambulatory Visit: Payer: Self-pay

## 2021-05-01 VITALS — BP 112/74 | HR 91

## 2021-05-01 DIAGNOSIS — M5116 Intervertebral disc disorders with radiculopathy, lumbar region: Secondary | ICD-10-CM | POA: Diagnosis not present

## 2021-05-01 DIAGNOSIS — M5416 Radiculopathy, lumbar region: Secondary | ICD-10-CM | POA: Diagnosis not present

## 2021-05-01 MED ORDER — METHYLPREDNISOLONE ACETATE 80 MG/ML IJ SUSP
80.0000 mg | Freq: Once | INTRAMUSCULAR | Status: AC
  Administered 2021-05-01: 80 mg

## 2021-05-01 NOTE — Procedures (Signed)
Lumbosacral Transforaminal Epidural Steroid Injection - Sub-Pedicular Approach with Fluoroscopic Guidance  Patient: Garrett Clark      Date of Birth: 23-Jan-1961 MRN: [id number] PCP: Kerin Perna, NP      Visit Date: 05/01/2021   Universal Protocol:    Date/Time: 05/01/2021  Consent Given By: the patient  Position: PRONE  Additional Comments: Vital signs were monitored before and after the procedure. Patient was prepped and draped in the usual sterile fashion. The correct patient, procedure, and site was verified.   Injection Procedure Details:   Procedure diagnoses: Lumbar radiculopathy [M54.16]    Meds Administered:  Meds ordered this encounter  Medications  . methylPREDNISolone acetate (DEPO-MEDROL) injection 80 mg    Laterality: Right  Location/Site:  L4-L5  Needle:5.0 in., 22 ga.  Short bevel or Quincke spinal needle  Needle Placement: Transforaminal  Findings:    -Comments: Excellent flow of contrast along the nerve, nerve root and into the epidural space.  Procedure Details: After squaring off the end-plates to get a true AP view, the C-arm was positioned so that an oblique view of the foramen as noted above was visualized. The target area is just inferior to the "nose of the scotty dog" or sub pedicular. The soft tissues overlying this structure were infiltrated with 2-3 ml. of 1% Lidocaine without Epinephrine.  The spinal needle was inserted toward the target using a "trajectory" view along the fluoroscope beam.  Under AP and lateral visualization, the needle was advanced so it did not puncture dura and was located close the 6 O'Clock position of the pedical in AP tracterory. Biplanar projections were used to confirm position. Aspiration was confirmed to be negative for CSF and/or blood. A 1-2 ml. volume of Isovue-250 was injected and flow of contrast was noted at each level. Radiographs were obtained for documentation purposes.   After attaining the  desired flow of contrast documented above, a 0.5 to 1.0 ml test dose of 0.25% Marcaine was injected into each respective transforaminal space.  The patient was observed for 90 seconds post injection.  After no sensory deficits were reported, and normal lower extremity motor function was noted,   the above injectate was administered so that equal amounts of the injectate were placed at each foramen (level) into the transforaminal epidural space.   Additional Comments:  The patient tolerated the procedure well Dressing: 2 x 2 sterile gauze and Band-Aid    Post-procedure details: Patient was observed during the procedure. Post-procedure instructions were reviewed.  Patient left the clinic in stable condition.

## 2021-05-01 NOTE — Progress Notes (Signed)
Numeric Pain Rating Scale and Functional Assessment Average Pain 7   In the last MONTH (on 0-10 scale) has pain interfered with the following?  1. General activity like being  able to carry out your everyday physical activities such as walking, climbing stairs, carrying groceries, or moving a chair?  Rating(7)  Patient states low back pain on the right side that radiates in the right hip at times.    +Driver, -BT, -Dye Allergies.

## 2021-05-01 NOTE — Progress Notes (Signed)
Garrett Clark - 60 y.o. male MRN [id number]  Date of birth: Nov 03, 1961  Office Visit Note: Visit Date: 05/01/2021 PCP: Kerin Perna, NP Referred by: Kerin Perna, NP  Subjective: Chief Complaint  Patient presents with  . Lower Back - Pain   HPI:  Garrett Clark is a 60 y.o. male who comes in today for planned repeat Right L4-L5 Lumbar epidural steroid injection with fluoroscopic guidance.  The patient has failed conservative care including home exercise, medications, time and activity modification.  This injection will be diagnostic and hopefully therapeutic.  Please see requesting physician notes for further details and justification. Patient received more than 50% pain relief from prior injection.   Referring: Annie Main, PA-C    ROS Otherwise per HPI.  Assessment & Plan: Visit Diagnoses:    ICD-10-CM   1. Lumbar radiculopathy  M54.16 XR C-ARM NO REPORT    Epidural Steroid injection    methylPREDNISolone acetate (DEPO-MEDROL) injection 80 mg  2. Radiculopathy due to lumbar intervertebral disc disorder  M51.16 XR C-ARM NO REPORT    Epidural Steroid injection    methylPREDNISolone acetate (DEPO-MEDROL) injection 80 mg    Plan: No additional findings.   Meds & Orders:  Meds ordered this encounter  Medications  . methylPREDNISolone acetate (DEPO-MEDROL) injection 80 mg    Orders Placed This Encounter  Procedures  . XR C-ARM NO REPORT  . Epidural Steroid injection    Follow-up: Return for visit to requesting physician as needed.   Procedures: No procedures performed  Lumbosacral Transforaminal Epidural Steroid Injection - Sub-Pedicular Approach with Fluoroscopic Guidance  Patient: Garrett Clark      Date of Birth: December 06, 1961 MRN: [id number] PCP: Kerin Perna, NP      Visit Date: 05/01/2021   Universal Protocol:    Date/Time: 05/01/2021  Consent Given By: the patient  Position: PRONE  Additional Comments: Vital signs were monitored  before and after the procedure. Patient was prepped and draped in the usual sterile fashion. The correct patient, procedure, and site was verified.   Injection Procedure Details:   Procedure diagnoses: Lumbar radiculopathy [M54.16]    Meds Administered:  Meds ordered this encounter  Medications  . methylPREDNISolone acetate (DEPO-MEDROL) injection 80 mg    Laterality: Right  Location/Site:  L4-L5  Needle:5.0 in., 22 ga.  Short bevel or Quincke spinal needle  Needle Placement: Transforaminal  Findings:    -Comments: Excellent flow of contrast along the nerve, nerve root and into the epidural space.  Procedure Details: After squaring off the end-plates to get a true AP view, the C-arm was positioned so that an oblique view of the foramen as noted above was visualized. The target area is just inferior to the "nose of the scotty dog" or sub pedicular. The soft tissues overlying this structure were infiltrated with 2-3 ml. of 1% Lidocaine without Epinephrine.  The spinal needle was inserted toward the target using a "trajectory" view along the fluoroscope beam.  Under AP and lateral visualization, the needle was advanced so it did not puncture dura and was located close the 6 O'Clock position of the pedical in AP tracterory. Biplanar projections were used to confirm position. Aspiration was confirmed to be negative for CSF and/or blood. A 1-2 ml. volume of Isovue-250 was injected and flow of contrast was noted at each level. Radiographs were obtained for documentation purposes.   After attaining the desired flow of contrast documented above, a 0.5 to 1.0 ml test dose of  0.25% Marcaine was injected into each respective transforaminal space.  The patient was observed for 90 seconds post injection.  After no sensory deficits were reported, and normal lower extremity motor function was noted,   the above injectate was administered so that equal amounts of the injectate were placed at each  foramen (level) into the transforaminal epidural space.   Additional Comments:  The patient tolerated the procedure well Dressing: 2 x 2 sterile gauze and Band-Aid    Post-procedure details: Patient was observed during the procedure. Post-procedure instructions were reviewed.  Patient left the clinic in stable condition.      Clinical History: MRI LUMBAR SPINE WITHOUT CONTRAST  TECHNIQUE: Multiplanar, multisequence MR imaging of the lumbar spine was performed. No intravenous contrast was administered.  COMPARISON:  Previous radiograph from 11/28/2020 as well as prior MRI from 05/10/2018.  FINDINGS: Segmentation: Standard. Lowest well-formed disc space labeled the L5-S1 level.  Alignment: Physiologic with preservation of the normal lumbar lordosis. No listhesis.  Vertebrae: Vertebral body height maintained without acute or chronic fracture. Bone marrow signal intensity within normal limits. No discrete or worrisome osseous lesions. Mild reactive marrow edema noted about the left L4-5 facet due to facet arthritis (series 4, image 13). No other abnormal marrow edema.  Conus medullaris and cauda equina: Conus extends to the T12-L1 level. Conus and cauda equina appear normal.  Paraspinal and other soft tissues: Paraspinous soft tissues within normal limits. Few scattered benign appearing T2 hyperintense simple cyst noted about the right kidney. 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 Few additional scattered simple cyst noted within the visualized liver. Visualized visceral structures and retroperitoneal contents otherwise unremarkable.  Disc levels:  L1-2:  Unremarkable.  L2-3: Normal interspace. Minimal facet hypertrophy. No stenosis or impingement.  L3-4: Degenerative intervertebral disc space narrowing with diffuse disc bulge and disc desiccation. Mild facet and ligament flavum hypertrophy. No significant spinal stenosis. Mild bilateral  L3 foraminal narrowing. No frank impingement.  L4-5: Minimal disc bulge. Subtle right foraminal to extraforaminal disc protrusion contacts the exiting right L4 nerve root as it courses of the right neural foramen (series 7, image 29). Finding could contribute to right-sided radicular symptoms. Mild facet and ligament flavum hypertrophy. No significant spinal stenosis. Mild bilateral L4 foraminal narrowing.  L5-S1: Mild diffuse disc bulge with disc desiccation and intervertebral disc space narrowing. Minimal facet hypertrophy. Epidural lipomatosis. No canal or lateral recess stenosis. Foramina remain patent.  IMPRESSION: 1. Subtle right foraminal to extraforaminal disc protrusion at L4-5, contacting and potentially irritating the exiting right L4 nerve root. Finding could contribute to right-sided radicular symptoms. 2. Additional mild disc bulging and facet hypertrophy at L3-4 and L5-S1 without significant stenosis or impingement. 3. Mild reactive marrow edema about the left L4-5 facet due to facet arthritis. Finding could serve as a source for lower back pain.   Electronically Signed   By: Jeannine Boga M.D.   On: 01/17/2021 05:22     Objective:  VS:  HT:    WT:   BMI:     BP:112/74  HR:91bpm  TEMP: ( )  RESP:97 % Physical Exam Vitals and nursing note reviewed.  Constitutional:      General: He is not in acute distress.    Appearance: Normal appearance. He is not ill-appearing.  HENT:     Head: Normocephalic and atraumatic.     Right Ear: External ear normal.     Left Ear: External ear normal.     Nose: No congestion.  Eyes:  Extraocular Movements: Extraocular movements intact.  Cardiovascular:     Rate and Rhythm: Normal rate.     Pulses: Normal pulses.  Pulmonary:     Effort: Pulmonary effort is normal. No respiratory distress.  Abdominal:     General: There is no distension.     Palpations: Abdomen is soft.  Musculoskeletal:         General: No tenderness or signs of injury.     Cervical back: Neck supple.     Right lower leg: No edema.     Left lower leg: No edema.     Comments: Patient has good distal strength without clonus.  Skin:    Findings: No erythema or rash.  Neurological:     General: No focal deficit present.     Mental Status: He is alert and oriented to person, place, and time.     Sensory: No sensory deficit.     Motor: No weakness or abnormal muscle tone.     Coordination: Coordination normal.  Psychiatric:        Mood and Affect: Mood normal.        Behavior: Behavior normal.      Imaging: No results found.

## 2021-05-01 NOTE — Patient Instructions (Signed)

## 2021-05-08 ENCOUNTER — Other Ambulatory Visit: Payer: Self-pay

## 2021-05-08 ENCOUNTER — Ambulatory Visit: Payer: Medicaid Other | Attending: Orthopedic Surgery

## 2021-05-08 DIAGNOSIS — G8929 Other chronic pain: Secondary | ICD-10-CM | POA: Insufficient documentation

## 2021-05-08 DIAGNOSIS — M25571 Pain in right ankle and joints of right foot: Secondary | ICD-10-CM | POA: Insufficient documentation

## 2021-05-08 DIAGNOSIS — M6281 Muscle weakness (generalized): Secondary | ICD-10-CM | POA: Insufficient documentation

## 2021-05-08 DIAGNOSIS — R2689 Other abnormalities of gait and mobility: Secondary | ICD-10-CM | POA: Insufficient documentation

## 2021-05-08 DIAGNOSIS — M545 Low back pain, unspecified: Secondary | ICD-10-CM | POA: Insufficient documentation

## 2021-05-08 DIAGNOSIS — M25562 Pain in left knee: Secondary | ICD-10-CM | POA: Insufficient documentation

## 2021-05-08 NOTE — Therapy (Signed)
[facility] [address] [place], [place], [address] Phone: [phone]   Fax:  [phone]  Physical Therapy Evaluation  Patient Details  Name: Garrett Clark MRN: [id number] Date of Birth: 07/13/1961 Referring Provider (PT): August Saucer Corrie Mckusick, MD   Encounter Date: 05/08/2021   PT End of Session - 05/08/21 1316    Visit Number 1    Number of Visits 9    Date for PT Re-Evaluation 06/08/21    Authorization Type UHC MCD    Authorization - Number of Visits 27    PT Start Time 1316    PT Stop Time 1400    PT Time Calculation (min) 44 min    Activity Tolerance Patient tolerated treatment well    Behavior During Therapy [facility] for tasks assessed/performed           Past Medical History:  Diagnosis Date  . Cancer (HCC)    Left kidney  . Hyperlipidemia   . Internal hemorrhoids   . Polycystic liver disease   . Substance abuse [facility])     Past Surgical History:  Procedure Laterality Date  . COLONOSCOPY    . HEMORRHOID BANDING    . ROBOT ASSISTED LAPAROSCOPIC NEPHRECTOMY Left 12/07/2017   Procedure: XI ROBOTIC ASSISTED LAPAROSCOPIC NEPHRECTOMY;  Surgeon: Malen Gauze, MD;  Location: [facility];  Service: Urology;  Laterality: Left;    There were no vitals filed for this visit.    Subjective Assessment - 05/08/21 1317    Subjective Patient reports his back is hurting a little bit on the Rt side. He recently underwent second ESI to the lumbar spine and feels that this has helped some with his pain. He continues to have pain on the Rt side of his low back. Patient reports his back pain began when he had his Lt kidney removed about 2 years ago. He reports prior to his first ESIhe was having pain down his RLE. He reports numbness in the Lt foot and 2nd digit that is constant that has been going on "forever." He denies any changes in bowel or bladder or saddle parasthesia.    Pertinent History renal cell carcinoma Lt kidney  (kidney removed), Rt heel pain    Limitations Lifting    How long can you sit comfortably? A couple hours    How long can you stand comfortably? A few hours    How long can you walk comfortably? "I can't walk comfortably at all" (mainly because of his Rt heel)    Diagnostic tests MRI: 1. Subtle right foraminal to extraforaminal disc protrusion at L4-5,  contacting and potentially irritating the exiting right L4 nerve  root. Finding could contribute to right-sided radicular symptoms.  2. Additional mild disc bulging and facet hypertrophy at L3-4 and  L5-S1 without significant stenosis or impingement.  3. Mild reactive marrow edema about the left L4-5 facet due to facet  arthritis. Finding could serve as a source for lower back pain.    Patient Stated Goals "to not feel pain no more."    Currently in Pain? Yes    Pain Score 6     Pain Location Back    Pain Orientation Right    Pain Descriptors / Indicators Nagging    Pain Type Chronic pain    Pain Radiating Towards prior to injection RLE referral    Pain Onset More than a month ago    Pain Frequency Intermittent    Aggravating Factors  working (lifting, cleaning,  bending, reaching overhead)    Pain Relieving Factors injection and laying down    Effect of Pain on Daily Activities difficulty with work specific activity              [facility] PT Assessment - 05/08/21 0001      Assessment   Medical Diagnosis Chronic right-sided low back pain, unspecified whether sciatica present    Referring Provider (PT) Marlou Sa, Tonna Corner, MD    Onset Date/Surgical Date --   >1 year   Hand Dominance Left    Next MD Visit plans to schedule    Prior Therapy none for the back      Precautions   Precaution Comments recent ESI      Restrictions   Weight Bearing Restrictions No      Balance Screen   Has the patient fallen in the past 6 months No      [place] residence    Living Arrangements Spouse/significant  other;Children    Type of [place]      Prior Function   Level of Independence Independent    Vocation Part time employment    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)      Cognition   Overall Cognitive Status Within Functional Limits for tasks assessed      Observation/Other Assessments   Focus on Therapeutic Outcomes (FOTO)  NA MCD      Sensation   Light Touch Appears Intact      Coordination   Gross Motor Movements are Fluid and Coordinated Yes      Functional Tests   Functional tests --   patient demonstrates excessive trunk flexion and limited hip/knee flexion when asked to demonstrate how he bends and lifts at work     Squat   Comments excessive trunk flexion      AROM   Overall AROM Comments Lumbar AROM WNL all planes with patient reporting Rt LBP with Rt lateral flexion    Lumbar - Right Side Bend --      Strength   Overall Strength Comments Rt knee and ankle strength 5/5; LLE strength gross 5/5    Right Hip Flexion 4+/5   pain anterior thigh   Right Hip Extension 4/5   pain lateral hip   Right Hip ABduction 4/5   pain lateral hip   Right Hip ADduction 5/5      Flexibility   Soft Tissue Assessment /Muscle Length yes    Hamstrings WNL bilaterally    Quadriceps tight bilaterally      Palpation   Spinal mobility normal and pain free spinal mobility    Palpation comment no palpable tenderness about lumbar region      Special Tests   Other special tests (-) SLR                      Objective measurements completed on examination: See above findings.       [facility] Adult PT Treatment/Exercise - 05/08/21 0001      Self-Care   Self-Care Other Self-Care Comments    Other Self-Care Comments  see patient education      Lumbar Exercises: Public affairs consultant 60 seconds    Quad Stretch Limitations bilateral      Lumbar Exercises: Standing   Other Standing Lumbar Exercises repeated lumbar  extension 1 x 10      Lumbar  Exercises: Seated   Other Seated Lumbar Exercises hip hinge iwth dowel 1 rep      Lumbar Exercises: Prone   Other Prone Lumbar Exercises prone press up 1 x 10                  PT Education - 05/08/21 1420    Education Details Education on current condition, POC, HEP, and centralization phenomenon.    Person(s) Educated Patient    Methods Explanation;Demonstration;Verbal cues;Handout;Tactile cues    Comprehension Verbalized understanding;Returned demonstration;Verbal cues required;Need further instruction            PT Short Term Goals - 05/08/21 1431      PT SHORT TERM GOAL #1   Title STG=LTG             PT Long Term Goals - 05/08/21 1431      PT LONG TERM GOAL #1   Title Patient will be independent with advanced HEP to improve ability to manage his chronic condition.    Baseline initial HEP issued at eval.    Time 4    Period Weeks    Status New    Target Date 06/08/21      PT LONG TERM GOAL #2   Title Patient will demonstrate 5/5 Rt hip strength to improve stability about the chain for prolonged walking.    Baseline see flowsheet    Time 4    Period Weeks    Status New    Target Date 06/08/21      PT LONG TERM GOAL #3   Title Patient will demonstrate proper body mechanics with bending and lifting tasks to reduce overall stress on his low back.    Baseline poor body mechanics    Time 4    Period Weeks    Status New    Target Date 06/05/21      PT LONG TERM GOAL #4   Title Patient will report a 50% reduction in his back pain with reaching, lifting, and bending activity.    Baseline 6-7/10    Time 4    Period Weeks    Status New    Target Date 06/05/21                  Plan - 05/08/21 1415    Clinical Impression Statement Patient is a 60 y/o male with who presents to OPPT with chief complaint of chronic Rt sided low back pain. He recently underwent ESI, which provided some pain relief, but continues to  report increased pain with lifting, bending and reaching activity. Overall he has good lumbar AROM reporting mild low back pain with right lateral flexion. He has minor Rt hip weakness and tightness in bilateral quadriceps. He has poor mechanics with lifting and bending activity demonstrating excessive trunk flexion when performing these tasks. He demonstrates initial positive response to extension biased movement reporting a decrease in pain 1/10 following repeated lumbar extension in standing. He will benefit from skilled PT to improve his hip strength and flexibility, body mechanics with work specific tasks, and to assist in pain reduction in order to optimize his function.    Personal Factors and Comorbidities Time since onset of injury/illness/exacerbation;Comorbidity 1;Profession    Comorbidities history of cancer    Examination-Activity Limitations Bend;Carry;Squat;Lift;Locomotion Level    Examination-Participation Restrictions Occupation    Stability/Clinical Decision Making Stable/Uncomplicated    Clinical Decision Making Low    Rehab Potential Good    PT Frequency --  1-2x/week   PT Duration --   3-4 weeks   PT Treatment/Interventions ADLs/Self Care Home Management;Cryotherapy;Electrical Stimulation;Moist Heat;Traction;Therapeutic activities;Therapeutic exercise;Neuromuscular re-education;Patient/family education;Manual techniques;Dry needling;Taping    PT Next Visit Plan posture/body mechanics handout. review HEP. hip strengthening. standing hip hinge with dowel, squatting    PT Home Exercise Plan Access Code: [email or web address]    Consulted and Agree with Plan of Care Patient           Patient will benefit from skilled therapeutic intervention in order to improve the following deficits and impairments:  Difficulty walking,Pain,Improper body mechanics,Impaired flexibility,Decreased strength  Visit Diagnosis: Chronic right-sided low back pain, unspecified whether sciatica present  Muscle  weakness (generalized)     Problem List Patient Active Problem List   Diagnosis Date Noted  . GERD (gastroesophageal reflux disease) 01/13/2019  . History of left radical nephrectomy 01/13/2019  . Renal cell carcinoma of left kidney ([place]) 12/07/2017  . Prolapsed internal hemorrhoids, grade 2 06/19/2017  . Pure hypercholesterolemia 03/30/2017  . History of cocaine abuse ([place]) 03/23/2017   Check all possible CPT codes: 32355- Therapeutic Exercise, [phone]- Neuro Re-education, [phone] - Manual Therapy, 97530 - Therapeutic Activities, 97535 - Self Care, [phone] - Mechanical traction and 97014 - Electrical stimulation (unattended)        Gwendolyn Grant, PT, DPT, ATC 05/08/21 2:57 PM [facility] Outpatient Rehabilitation [facility] [address] [place], [place], [address] Phone: [phone]   Fax:  [phone]  Name: ZACCHARY POMPEI MRN: [id number] Date of Birth: October 12, 1961

## 2021-05-16 ENCOUNTER — Ambulatory Visit: Payer: Medicaid Other

## 2021-05-16 ENCOUNTER — Other Ambulatory Visit: Payer: Self-pay

## 2021-05-16 DIAGNOSIS — R2689 Other abnormalities of gait and mobility: Secondary | ICD-10-CM

## 2021-05-16 DIAGNOSIS — M6281 Muscle weakness (generalized): Secondary | ICD-10-CM

## 2021-05-16 DIAGNOSIS — M545 Low back pain, unspecified: Secondary | ICD-10-CM | POA: Diagnosis not present

## 2021-05-16 DIAGNOSIS — G8929 Other chronic pain: Secondary | ICD-10-CM

## 2021-05-16 DIAGNOSIS — M25571 Pain in right ankle and joints of right foot: Secondary | ICD-10-CM

## 2021-05-16 DIAGNOSIS — M25562 Pain in left knee: Secondary | ICD-10-CM

## 2021-05-16 NOTE — Therapy (Addendum)
[place], [place], [address] Phone: [phone]   Fax:  [phone]  Physical Therapy Treatment/ Discharge Summary  Patient Details  Name: Garrett Clark MRN: [id number] Date of Birth: [birth year] Referring Provider (PT): Marlou Sa Tonna Corner, MD   Encounter Date: 05/16/2021   PT End of Session - 05/16/21 1406     Visit Number 2    Number of Visits 9    Date for PT Re-Evaluation 06/08/21    Authorization Type UHC MCD    Authorization - Number of Visits 27    PT Start Time 1319    PT Stop Time 1402    PT Time Calculation (min) 43 min    Activity Tolerance Patient tolerated treatment well    Behavior During Therapy [facility] for tasks assessed/performed             Past Medical History:  Diagnosis Date   Cancer ([place])    Left kidney   Hyperlipidemia    Internal hemorrhoids    Polycystic liver disease    Substance abuse ([place])     Past Surgical History:  Procedure Laterality Date   COLONOSCOPY     HEMORRHOID BANDING     ROBOT ASSISTED LAPAROSCOPIC NEPHRECTOMY Left 12/07/2017   Procedure: XI ROBOTIC ASSISTED LAPAROSCOPIC NEPHRECTOMY;  Surgeon: Cleon Gustin, MD;  Location: [facility];  Service: Urology;  Laterality: Left;    There were no vitals filed for this visit.   Subjective Assessment - 05/16/21 1318     Subjective Pt reports he is not doing well today. He states his low back has been in a lot of pain since his last appointment and he is experiencing numbness in his R hip and thigh. 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 He states his PCM is aware of this issue. 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 He states that he doesn't feel like doing much today due to his pain.    Pertinent History renal cell carcinoma Lt kidney (kidney removed), Rt heel  pain    Limitations Lifting    How long can you sit comfortably? A couple hours    How long can you stand comfortably? A few hours    How long can you walk comfortably? "I can't walk comfortably at all" (mainly because of his Rt heel)    Diagnostic tests MRI: 1. Subtle right foraminal to extraforaminal disc protrusion at L4-5,  contacting and potentially irritating the exiting right L4 nerve  root. Finding could contribute to right-sided radicular symptoms.  2. Additional mild disc bulging and facet hypertrophy at L3-4 and  L5-S1 without significant stenosis or impingement.  3. Mild reactive marrow edema about the left L4-5 facet due to facet  arthritis. Finding could serve as a source for lower back pain.    Patient Stated Goals "to not feel pain no more."    Currently in Pain? Yes    Pain Score 8     Pain Location Back    Pain Orientation Right    Pain Descriptors / Indicators Aching    Pain Type Chronic pain    Pain Onset More than a month ago    Pain Frequency Constant    Multiple Pain Sites Yes    Pain Score 8    Pain Location Hip  Pain Orientation Right;Lateral    Pain Descriptors / Indicators Numbness    Pain Type Chronic pain    Pain Onset More than a month ago    Pain Frequency Intermittent                               [facility] Adult PT Treatment/Exercise - 05/16/21 0001       Lumbar Exercises: Standing   Other Standing Lumbar Exercises Pallof press with green theraband 1x10 BIL with 5 second hold      Lumbar Exercises: Seated   Other Seated Lumbar Exercises Seated R sciatic nerve glides 2x20      Lumbar Exercises: Supine   Large Ball Abdominal Isometric --   3x30sec     Lumbar Exercises: Quadruped   Other Quadruped Lumbar Exercises Thread the needle 2x10      Manual Therapy   Manual Therapy --   Cross-friction massage and efflourage to R paraspinals and QL                   PT Education - 05/16/21 1404     Education Details Pt  educated on importance of proper lifting technique at work and proper form with newly added exercises.    Person(s) Educated Patient    Methods Explanation;Demonstration;Tactile cues;Verbal cues;Handout    Comprehension Verbalized understanding;Returned demonstration;Tactile cues required;Verbal cues required              PT Short Term Goals - 05/08/21 1431       PT SHORT TERM GOAL #1   Title STG=LTG               PT Long Term Goals - 05/08/21 1431       PT LONG TERM GOAL #1   Title Patient will be independent with advanced HEP to improve ability to manage his chronic condition.    Baseline initial HEP issued at eval.    Time 4    Period Weeks    Status New    Target Date 06/08/21      PT LONG TERM GOAL #2   Title Patient will demonstrate 5/5 Rt hip strength to improve stability about the chain for prolonged walking.    Baseline see flowsheet    Time 4    Period Weeks    Status New    Target Date 06/08/21      PT LONG TERM GOAL #3   Title Patient will demonstrate proper body mechanics with bending and lifting tasks to reduce overall stress on his low back.    Baseline poor body mechanics    Time 4    Period Weeks    Status New    Target Date 06/05/21      PT LONG TERM GOAL #4   Title Patient will report a 50% reduction in his back pain with reaching, lifting, and bending activity.    Baseline 6-7/10    Time 4    Period Weeks    Status New    Target Date 06/05/21                   Plan - 05/16/21 1407     Clinical Impression Statement Pt responded well to all treatment today, demonstrating the ability to perform all exercises with good form and without increase in pain, although he required verbal and tactile cuing to ensure proper form with Pallof presses and sciatic nerve  glides. He reports centralization of pain from his thigh to his low back during sciatic nerve glides and further decrease in pain to 4/10 following manual therapy at the end  of the session. He will continue to benefit from skilled PT to address his primary impairments and help him to return to his prior level of function without limitation due to pain.    Personal Factors and Comorbidities Time since onset of injury/illness/exacerbation;Comorbidity 1;Profession    Comorbidities history of cancer    Examination-Activity Limitations Bend;Carry;Squat;Lift;Locomotion Level    Examination-Participation Restrictions Occupation    Stability/Clinical Decision Making Stable/Uncomplicated    Clinical Decision Making Low    Rehab Potential Good    PT Frequency Other (comment)   1-2x/ week   PT Duration --   3-4 weeks   PT Treatment/Interventions ADLs/Self Care Home Management;Cryotherapy;Electrical Stimulation;Moist Heat;Traction;Therapeutic activities;Therapeutic exercise;Neuromuscular re-education;Patient/family education;Manual techniques;Dry needling;Taping    PT Next Visit Plan posture/body mechanics handout. Review HEP, progress core/ hip strengthening. Focus on centralization of pain with nerve glides at start of session. Consider manual therapy at end of session for pain reduction.    PT Home Exercise Plan Access Code: [email or web address]    Consulted and Agree with Plan of Care Patient             Patient will benefit from skilled therapeutic intervention in order to improve the following deficits and impairments:  Difficulty walking,Pain,Improper body mechanics,Impaired flexibility,Decreased strength  Visit Diagnosis: Chronic right-sided low back pain, unspecified whether sciatica present  Other abnormalities of gait and mobility  Muscle weakness (generalized)  Pain in right ankle and joints of right foot  Chronic pain of left knee     Problem List Patient Active Problem List   Diagnosis Date Noted   GERD (gastroesophageal reflux disease) 01/13/2019   History of left radical nephrectomy 01/13/2019   Renal cell carcinoma of left kidney ([place]) 12/07/2017    Prolapsed internal hemorrhoids, grade 2 06/19/2017   Pure hypercholesterolemia 03/30/2017   History of cocaine abuse ([place]) 03/23/2017    Vanessa [place], PT, DPT 05/16/21 2:12 PM   [facility] [address] [place], [place], [address] Phone: [phone]   Fax:  [phone]  Name: CHUONG CASEBEER MRN: [id number] Date of Birth: 1961/09/13  PHYSICAL THERAPY DISCHARGE SUMMARY  Visits from Start of Care: 2  Current functional level related to goals / functional outcomes: Unable to assess   Remaining deficits: Unable to assess   Education / Equipment: HEP   Patient agrees to discharge. Patient goals were not met. Patient is being discharged due to not returning since the last visit.  Vanessa [place]ah, PT, DPT 07/25/21 2:18 PM

## 2021-05-16 NOTE — Patient Instructions (Signed)
   [email or web address]

## 2021-05-22 ENCOUNTER — Ambulatory Visit: Payer: Medicaid Other

## 2021-05-23 ENCOUNTER — Encounter: Payer: Medicaid Other | Admitting: Physical Therapy

## 2021-05-23 ENCOUNTER — Telehealth: Payer: Self-pay | Admitting: Physical Therapy

## 2021-05-23 NOTE — Telephone Encounter (Signed)
Called and reminded patient of missed visit, next appt, and attendance policy.

## 2021-05-27 ENCOUNTER — Telehealth: Payer: Self-pay

## 2021-05-27 ENCOUNTER — Ambulatory Visit: Payer: Medicaid Other | Attending: Orthopedic Surgery

## 2021-05-27 NOTE — Telephone Encounter (Signed)
Spoke with patient regarding missed PT appointment. He forgot about scheduled visit. Confirmed next scheduled visit. Reviewed attendance policy instructing patient that due to 2 consecutive missed appointments he is allowed to schedule one visit at a time moving forward with patient verbalizing understanding.

## 2021-05-29 ENCOUNTER — Ambulatory Visit: Payer: Medicaid Other

## 2021-06-06 ENCOUNTER — Encounter: Payer: Medicaid Other | Admitting: Physical Therapy

## 2021-07-29 ENCOUNTER — Other Ambulatory Visit: Payer: Self-pay | Admitting: Urology

## 2021-07-29 DIAGNOSIS — C642 Malignant neoplasm of left kidney, except renal pelvis: Secondary | ICD-10-CM | POA: Diagnosis not present

## 2021-07-30 LAB — COMPREHENSIVE METABOLIC PANEL
ALT: 15 IU/L (ref 0–44)
AST: 17 IU/L (ref 0–40)
Albumin/Globulin Ratio: 1.4 (ref 1.2–2.2)
Albumin: 4.2 g/dL (ref 3.8–4.9)
Alkaline Phosphatase: 110 IU/L (ref 44–121)
BUN/Creatinine Ratio: 10 (ref 9–20)
BUN: 16 mg/dL (ref 6–24)
Bilirubin Total: 0.7 mg/dL (ref 0.0–1.2)
CO2: 21 mmol/L (ref 20–29)
Calcium: 9.3 mg/dL (ref 8.7–10.2)
Chloride: 104 mmol/L (ref 96–106)
Creatinine, Ser: 1.53 mg/dL — ABNORMAL HIGH (ref 0.76–1.27)
Globulin, Total: 3.1 g/dL (ref 1.5–4.5)
Glucose: 88 mg/dL (ref 65–99)
Potassium: 4.8 mmol/L (ref 3.5–5.2)
Sodium: 141 mmol/L (ref 134–144)
Total Protein: 7.3 g/dL (ref 6.0–8.5)
eGFR: 52 mL/min/{1.73_m2} — ABNORMAL LOW (ref >59)

## 2021-08-15 ENCOUNTER — Telehealth: Payer: Self-pay

## 2021-08-15 NOTE — Telephone Encounter (Signed)
Patient was called to reschedule visit to a virtual / telephone visit. He said he has been having some tension on his back right side that started with a severe sharp pain.  Wanting to speak with a nurse and wants to be seen in person to have this checked out.  Please advise.  Call back:  [phone]  Thanks, Helene Kelp

## 2021-08-15 NOTE — Telephone Encounter (Signed)
Returned patient call.  Patient will keep scheduled appt and have chest xray done same day of appt.

## 2021-08-20 ENCOUNTER — Other Ambulatory Visit: Payer: Medicaid Other

## 2021-08-28 ENCOUNTER — Ambulatory Visit (HOSPITAL_COMMUNITY)
Admission: RE | Admit: 2021-08-28 | Discharge: 2021-08-28 | Disposition: A | Discharge status: Home / Self Care | Payer: Medicaid Other | Source: Ambulatory Visit | Attending: Urology | Admitting: Urology

## 2021-08-28 ENCOUNTER — Other Ambulatory Visit: Payer: Self-pay

## 2021-08-28 ENCOUNTER — Encounter: Payer: Self-pay | Admitting: Urology

## 2021-08-28 ENCOUNTER — Ambulatory Visit (INDEPENDENT_AMBULATORY_CARE_PROVIDER_SITE_OTHER): Payer: Medicaid Other | Admitting: Urology

## 2021-08-28 VITALS — BP 125/78 | HR 82

## 2021-08-28 DIAGNOSIS — M5441 Lumbago with sciatica, right side: Secondary | ICD-10-CM

## 2021-08-28 DIAGNOSIS — Z85528 Personal history of other malignant neoplasm of kidney: Secondary | ICD-10-CM | POA: Diagnosis not present

## 2021-08-28 DIAGNOSIS — C642 Malignant neoplasm of left kidney, except renal pelvis: Secondary | ICD-10-CM | POA: Diagnosis not present

## 2021-08-28 MED ORDER — CYCLOBENZAPRINE HCL 10 MG PO TABS: ORAL_TABLET | ORAL | 2 refills | Status: DC

## 2021-08-28 NOTE — Patient Instructions (Signed)
Kidney Cancer  Kidney cancer is an abnormal growth of cells in one or both kidneys. The kidneys filter waste from your blood and produce urine. Kidney cancer may spread to other parts of your body. This type of cancer may also be calledrenal cell carcinoma. What are the causes? The cause of this condition is not always known. In some cases, abnormal changes to genes (genetic mutations) can cause cells to form cancer. What increases the risk? You may be more likely to develop kidney cancer if you: Are over age 60. The risk increases with age. Have a family history of kidney cancer. Are of African-American, Native American, or Native [place] descent. Smoke. Are male. Are obese. Have high blood pressure (hypertension). Have advanced kidney disease, especially if you need long-term dialysis. Have certain conditions that are passed from parent to child (inherited), such as von Hippel-Lindau disease, tuberous sclerosis, or hereditary papillary renal carcinoma. Have been exposed to certain chemicals. What are the signs or symptoms? In the early stages, kidney cancer does not cause symptoms. As the cancer grows, symptoms may include: Blood in the urine. Pain in the upper back or abdomen, just below the rib cage. You may feel pain on one or both sides of the body. Fatigue. Unexplained weight loss. Fever. How is this diagnosed? This condition may be diagnosed based on: Your symptoms and medical history. A physical exam. Blood and urine tests. X-rays. Imaging tests, such as CT scans, MRIs, and PET scans. Having dye injected into your blood through an IV, and then having X-rays taken of: Your kidneys and the rest of the organs involved in making and storing urine (intravenous pyelogram). Your blood vessels (angiogram). Removal and testing of a kidney tissue sample (biopsy). Your cancer will be assessed (staged), based on how severe it is and how much it has spread. How is this  treated? Treatment depends on the type and stage of the cancer. Treatment may include one or more of the following: Surgery. This may include surgery to remove: Just the tumor (nephron-sparing surgery). The entire kidney (nephrectomy). The kidney, some of the surrounding healthy tissue, nearby lymph nodes, and the adrenal gland in certain cases (radical nephrectomy). Medicines that kill cancer cells (chemotherapy). High-energy rays that kill cancer cells (radiation therapy). Targeted therapy. This targets specific parts of cancer cells and the area around them to block the growth and the spread of the cancer. Targeted therapy can help to limit the damage to healthy cells. Medicines that help your body's disease-fighting system (immune system) fight cancer cells (immunotherapy). Freezing cancer cells using gas or liquid that is delivered through a needle (cryoablation). Destroying cancer cells using high-energy radio waves that are delivered through a needle-like probe (radiofrequency ablation). A procedure to block the artery that supplies blood to the tumor, which kills the cancer cells (embolization). Follow these instructions at home: Eating and drinking Some of your treatments might affect your appetite and your ability to chew and swallow. If you are having problems eating, or if you do not have an appetite, meet with a diet and nutrition specialist (dietitian). If you have side effects that affect eating, it may help to: Eat smaller meals and snacks often. Drink high-nutrition and high-calorie shakes or supplements. Eat bland and soft foods that are easy to eat. Not eat foods that are hot, spicy, or hard to swallow. Lifestyle Do not drink alcohol. Do not use any products that contain nicotine or tobacco, such as cigarettes and e-cigarettes. If you need help   quitting, ask your health care provider. General instructions  Take over-the-counter and prescription medicines only as told by  your health care provider. This includes vitamins, supplements, and herbal products. Consider joining a support group to help you cope with the stress of having kidney cancer. Work with your health care provider to manage any side effects of treatment. Keep all follow-up visits as told by your health care provider. This is important.  Where to find more information American Cancer Society: https://www.cancer.org National Cancer Institute (NCI): https://www.cancer.gov Contact a health care provider if you: Notice that you bruise or bleed easily. Are losing weight without trying. Have new or increased fatigue or weakness. Get help right away if you have: Blood in your urine. A sudden increase in pain. A fever. Shortness of breath. Chest pain. Yellow skin or whites of your eyes (jaundice). Summary Kidney cancer is an abnormal growth of cells (tumor) in one or both kidneys. Tumors may spread to other parts of your body. In the early stages, kidney cancer does not cause symptoms. As the cancer grows, symptoms may include blood in the urine, pain in the upper back or abdomen, unexplained weight loss, fatigue, and fever. Treatment depends on the type and stage of the cancer. It may include surgery to remove the tumor, procedures and medicines to kill the cancer cells, or medicines to help your body fight cancer cells. This information is not intended to replace advice given to you by your health care provider. Make sure you discuss any questions you have with your healthcare provider. Document Revised: 12/30/2017 Document Reviewed: 12/27/2017 Elsevier Patient Education  2022 Elsevier Inc.  

## 2021-08-28 NOTE — Progress Notes (Signed)

## 2021-08-28 NOTE — Progress Notes (Signed)
08/28/2021 2:46 PM   Garrett Clark 1961-02-15 [id number]  Referring provider: Kerin Perna, NP [address] 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 CXR shows no evidence no metastatic disease. Creatinien 1.53. He has chronci right flank pain which he uses flexeril prn. No LUTS. No other complaints.    PMH: Past Medical History:  Diagnosis Date   Cancer ([place])    Left kidney   Hyperlipidemia    Internal hemorrhoids    Polycystic liver disease    Substance abuse ([place])     Surgical History: Past Surgical History:  Procedure Laterality Date   COLONOSCOPY     HEMORRHOID BANDING     ROBOT ASSISTED LAPAROSCOPIC NEPHRECTOMY Left 12/07/2017   Procedure: XI ROBOTIC ASSISTED LAPAROSCOPIC NEPHRECTOMY;  Surgeon: Cleon Gustin, MD;  Location: [facility];  Service: Urology;  Laterality: Left;    Home Medications:  Allergies as of 08/28/2021   No Known Allergies      Medication List        Accurate as of August 28, 2021  2:46 PM. If you have any questions, ask your nurse or doctor.          acetaminophen 500 MG tablet Commonly known as: TYLENOL Take 1 tablet (500 mg total) by mouth every 6 (six) hours as needed.   atorvastatin 40 MG tablet Commonly known as: LIPITOR TAKE 1 TABLET(40 MG) BY MOUTH DAILY   cyclobenzaprine 10 MG tablet Commonly known as: FLEXERIL TAKE 1 TABLET(10 MG) BY MOUTH THREE TIMES DAILY AS NEEDED FOR MUSCLE SPASMS   gabapentin 300 MG capsule Commonly known as: NEURONTIN Take 1 capsule '300mg'$  by mouth twice dailyTAKE 1 CAPSULE(300 MG) BY MOUTH TWICE DAILY        Allergies: No Known Allergies  Family History: Family History  Problem Relation Age of Onset   Diabetes Mother    Hypertension Mother    Alcohol abuse Mother    Diabetes Father    Kidney disease Father    Diabetes Sister    Stroke Sister    Diabetes  Paternal Grandmother    Cancer Paternal Grandfather        type unknown   Colon cancer Neg Hx     Social History:  reports that he quit smoking about 8 years ago. His smoking use included cigarettes. He has never used smokeless tobacco. He reports current alcohol use. He reports current drug use. Drug: Marijuana.  ROS: All other review of systems were reviewed and are negative except what is noted above in HPI  Physical Exam: BP 125/78   Pulse 82   Constitutional:  Alert and oriented, No acute distress. HEENT: [place] AT, moist mucus membranes.  Trachea midline, no masses. Cardiovascular: No clubbing, cyanosis, or edema. Respiratory: Normal respiratory effort, no increased work of breathing. GI: Abdomen is soft, nontender, nondistended, no abdominal masses GU: No CVA tenderness.  Lymph: No cervical or inguinal lymphadenopathy. Skin: No rashes, bruises or suspicious lesions. Neurologic: Grossly intact, no focal deficits, moving all 4 extremities. Psychiatric: Normal mood and affect.  Laboratory Data: Lab Results  Component Value Date   WBC 4.9 07/13/2020   HGB 13.6 07/13/2020   HCT 40.0 07/13/2020   MCV 95.1 07/13/2020   PLT 296 07/13/2020    Lab Results  Component Value Date   CREATININE 1.53 (H) 07/29/2021    No results  found for: PSA  No results found for: TESTOSTERONE  No results found for: HGBA1C  Urinalysis    Component Value Date/Time   COLORURINE YELLOW 07/13/2020 1945   APPEARANCEUR Clear 08/29/2020 1647   LABSPEC 1.021 07/13/2020 1945   PHURINE 6.0 07/13/2020 1945   GLUCOSEU Negative 08/29/2020 [place] 07/13/2020 1945   BILIRUBINUR Negative 08/29/2020 [place] 07/13/2020 1945   PROTEINUR Negative 08/29/2020 [place] 07/13/2020 1945   UROBILINOGEN 1.0 03/07/2020 1204   NITRITE Negative 08/29/2020 1647   NITRITE NEGATIVE 07/13/2020 1945   LEUKOCYTESUR Negative 08/29/2020 1647   LEUKOCYTESUR NEGATIVE  07/13/2020 1945    Lab Results  Component Value Date   LABMICR See below: 08/29/2020   WBCUA None seen 08/29/2020   LABEPIT None seen 08/29/2020   BACTERIA None seen 08/29/2020    Pertinent Imaging: CXR today: Images reviewed and discussed with the patient No results found for this or any previous visit.  No results found for this or any previous visit.  No results found for this or any previous visit.  No results found for this or any previous visit.  No results found for this or any previous visit.  No results found for this or any previous visit.  No results found for this or any previous visit.  No results found for this or any previous visit.   Assessment & Plan:    1. Renal cell carcinoma of left kidney (HCC) -RTC 1 year with a CXR and CMP   No follow-ups on file.  Nicolette Bang, MD  [facility] [place]

## 2021-09-29 ENCOUNTER — Other Ambulatory Visit (INDEPENDENT_AMBULATORY_CARE_PROVIDER_SITE_OTHER): Payer: Self-pay | Admitting: Primary Care

## 2021-09-29 DIAGNOSIS — E7841 Elevated Lipoprotein(a): Secondary | ICD-10-CM

## 2021-09-29 NOTE — Telephone Encounter (Signed)
Requested medication (s) are due for refill today: yes  Requested medication (s) are on the active medication list: yes  Last refill:  01/08/21 #90 1 RF  Future visit scheduled: no  Notes to clinic:  cholesterol, LDL, HDL, triglycerides overdue (last RF 11/05/19)   Requested Prescriptions  Pending Prescriptions Disp Refills   atorvastatin (LIPITOR) 40 MG tablet [Pharmacy Med Name: ATORVASTATIN 40MG  TABLETS] 90 tablet 1    Sig: TAKE 1 TABLET(40 MG) BY MOUTH DAILY     Cardiovascular:  Antilipid - Statins Failed - 09/29/2021  3:06 PM      Failed - Total Cholesterol in normal range and within 360 days    Cholesterol, Total  Date Value Ref Range Status  10/26/2019 153 100 - 199 mg/dL Final          Failed - LDL in normal range and within 360 days    LDL Chol Calc (NIH)  Date Value Ref Range Status  10/26/2019 92 0 - 99 mg/dL Final          Failed - HDL in normal range and within 360 days    HDL  Date Value Ref Range Status  10/26/2019 37 (L) >39 mg/dL Final          Failed - Triglycerides in normal range and within 360 days    Triglycerides  Date Value Ref Range Status  10/26/2019 137 0 - 149 mg/dL Final          Passed - Patient is not pregnant      Passed - Valid encounter within last 12 months    Recent Outpatient Visits           8 months ago Low back pain with right-sided sciatica, unspecified back pain laterality, unspecified chronicity   [facility] Juluis Mire P, NP   10 months ago Chronic right-sided low back pain, unspecified whether sciatica present   [place] Kerin Perna, NP   1 year ago Effusion of right knee   [facility] Kerin Perna, NP   1 year ago Effusion of right knee   [facility] Kerin Perna, NP   1 year ago Physical exam   [place], Michelle P, NP       Future Appointments              In 1 week Oletta Lamas, Milford Cage, NP [place]   In 11 months McKenzie, Candee Furbish, MD [place]

## 2021-09-30 NOTE — Telephone Encounter (Signed)
Sent to PCP to refill if appropriate. Last lipid was 10/2019

## 2021-10-10 ENCOUNTER — Ambulatory Visit (INDEPENDENT_AMBULATORY_CARE_PROVIDER_SITE_OTHER): Payer: Medicaid Other | Admitting: Primary Care

## 2021-10-10 ENCOUNTER — Encounter (INDEPENDENT_AMBULATORY_CARE_PROVIDER_SITE_OTHER): Payer: Self-pay | Admitting: Primary Care

## 2021-10-10 ENCOUNTER — Other Ambulatory Visit: Payer: Self-pay

## 2021-10-10 VITALS — BP 136/91 | HR 65 | Temp 97.5°F | Ht 69.5 in | Wt 194.6 lb

## 2021-10-10 DIAGNOSIS — M25512 Pain in left shoulder: Secondary | ICD-10-CM | POA: Diagnosis not present

## 2021-10-10 DIAGNOSIS — M25511 Pain in right shoulder: Secondary | ICD-10-CM

## 2021-10-10 DIAGNOSIS — K2100 Gastro-esophageal reflux disease with esophagitis, without bleeding: Secondary | ICD-10-CM | POA: Diagnosis not present

## 2021-10-10 DIAGNOSIS — M79602 Pain in left arm: Secondary | ICD-10-CM

## 2021-10-10 DIAGNOSIS — R202 Paresthesia of skin: Secondary | ICD-10-CM

## 2021-10-10 DIAGNOSIS — M542 Cervicalgia: Secondary | ICD-10-CM

## 2021-10-10 DIAGNOSIS — M79601 Pain in right arm: Secondary | ICD-10-CM

## 2021-10-10 DIAGNOSIS — M2550 Pain in unspecified joint: Secondary | ICD-10-CM

## 2021-10-10 MED ORDER — OMEPRAZOLE 20 MG PO CPDR
20.0000 mg | DELAYED_RELEASE_CAPSULE | Freq: Every day | ORAL | 3 refills | Status: DC
Start: 2021-10-10 — End: 2023-04-01

## 2021-10-10 MED ORDER — GABAPENTIN 300 MG PO CAPS: ORAL_CAPSULE | ORAL | 3 refills | Status: DC
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
## 2021-10-10 NOTE — Patient Instructions (Signed)
Shoulder Pain °Many things can cause shoulder pain, including: °An injury to the shoulder. °Overuse of the shoulder. °Arthritis. °The source of the pain can be: °Inflammation. °An injury to the shoulder joint. °An injury to a tendon, ligament, or bone. °Follow these instructions at home: °Pay attention to changes in your symptoms. Let your health care provider know about them. Follow these instructions to relieve your pain. °If you have a sling: °Wear the sling as told by your health care provider. Remove it only as told by your health care provider. °Loosen the sling if your fingers tingle, become numb, or turn cold and blue. °Keep the sling clean. °If the sling is not waterproof: °Do not let it get wet. Remove it to shower or bathe. °Move your arm as little as possible, but keep your hand moving to prevent swelling. °Managing pain, stiffness, and swelling ° °If directed, put ice on the painful area: °Put ice in a plastic bag. °Place a towel between your skin and the bag. °Leave the ice on for 20 minutes, 2-3 times per day. Stop applying ice if it does not help with the pain. °Squeeze a soft ball or a foam pad as much as possible. This helps to keep the shoulder from swelling. It also helps to strengthen the arm. °General instructions °Take over-the-counter and prescription medicines only as told by your health care provider. °Keep all follow-up visits as told by your health care provider. This is important. °Contact a health care provider if: °Your pain gets worse. °Your pain is not relieved with medicines. °New pain develops in your arm, hand, or fingers. °Get help right away if: °Your arm, hand, or fingers: °Tingle. °Become numb. °Become swollen. °Become painful. °Turn white or blue. °Summary °Shoulder pain can be caused by an injury, overuse, or arthritis. °Pay attention to changes in your symptoms. Let your health care provider know about them. °This condition may be treated with a sling, ice, and pain  medicines. °Contact your health care provider if the pain gets worse or new pain develops. Get help right away if your arm, hand, or fingers tingle or become numb, swollen, or painful. °Keep all follow-up visits as told by your health care provider. This is important. °This information is not intended to replace advice given to you by your health care provider. Make sure you discuss any questions you have with your health care provider. °Document Revised: 06/22/2018 Document Reviewed: 06/22/2018 °Elsevier Patient Education © 2022 Elsevier Inc. ° °

## 2021-10-12 LAB — RHEUMATOID ARTHRITIS PROFILE
Cyclic Citrullin Peptide Ab: 7 units (ref 0–19)
Rheumatoid fact SerPl-aCnc: 14.8 IU/mL — ABNORMAL HIGH (ref <14.0)

## 2021-10-16 ENCOUNTER — Telehealth (INDEPENDENT_AMBULATORY_CARE_PROVIDER_SITE_OTHER): Payer: Self-pay

## 2021-10-16 NOTE — Telephone Encounter (Signed)
Patient aware of results per PCP. [name], CMA

## 2021-10-16 NOTE — Telephone Encounter (Signed)
-----   Message from Kerin Perna, NP sent at 10/14/2021  4:53 PM EDT ----- Very unlikely arthritis is present . Previous injuries can play a role in pain.

## 2021-10-28 ENCOUNTER — Ambulatory Visit: Payer: Medicaid Other | Admitting: Orthopedic Surgery

## 2021-10-30 ENCOUNTER — Other Ambulatory Visit (INDEPENDENT_AMBULATORY_CARE_PROVIDER_SITE_OTHER): Payer: Self-pay | Admitting: Primary Care

## 2021-10-30 DIAGNOSIS — E7841 Elevated Lipoprotein(a): Secondary | ICD-10-CM

## 2021-11-04 ENCOUNTER — Ambulatory Visit: Payer: Medicaid Other | Admitting: Orthopedic Surgery

## 2021-11-04 ENCOUNTER — Ambulatory Visit (INDEPENDENT_AMBULATORY_CARE_PROVIDER_SITE_OTHER): Payer: Medicaid Other

## 2021-11-04 ENCOUNTER — Other Ambulatory Visit: Payer: Self-pay

## 2021-11-04 DIAGNOSIS — M79601 Pain in right arm: Secondary | ICD-10-CM

## 2021-11-06 ENCOUNTER — Encounter: Payer: Self-pay | Admitting: Orthopedic Surgery

## 2021-11-06 NOTE — Progress Notes (Signed)
Office Visit Note   Patient: Garrett Clark           Date of Birth: 03-Oct-1961           MRN: [id number] Visit Date: 11/04/2021 Requested by: Kerin Perna, NP [address] [place],  [place] [address] PCP: Kerin Perna, NP  Subjective: Chief Complaint  Patient presents with   Other    Neck and bilateral radicular arm pain     HPI: Garrett Clark is a 60 year old patient with bilateral shoulder pain which has been going on for 10 years or more.  The right hurts worse than the left.  Pain comes and goes in severity but generally is constant.  Pain is worse with activity.  Does not wake from sleep with pain.  Does have painful range of motion.  He also reports radicular pain down to the fingers on the right and left hand side.  Also describes right-sided neck pain.  All of his symptoms are really on the right side.  He has 1 kidney.  Takes gabapentin.  Has a history of renal cell carcinoma.  Had C-spine MRI 2019 which showed only mild degenerative changes.  He does give a discrete history of the pain radiating down to the dorsal aspect of his right hand.  He works in [employer].              ROS: All systems reviewed are negative as they relate to the chief complaint within the history of present illness.  Patient denies  fevers or chills.   Assessment & Plan: Visit Diagnoses:  1. Right arm pain     Plan: Impression is right-sided radicular pain which has been longstanding.  Shoulder exam and radiographs relatively unremarkable.  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  Rotator cuff strength is reasonable.  He does have a little bit of coarseness with internal and external rotation.  No evidence of frozen shoulder.  By history this looks more like radiculopathy on exam it could be from the shoulder.  Due to duration of symptoms plan MRI C-spine to evaluate right-sided radiculopathy with a background of history of renal cell carcinoma.  Same issue for the right  shoulder.  Plan MRI arthrogram to evaluate rotator cuff tear again with a history of renal cell carcinoma.  No radiographic lytic lesions to suggest metastasis but that always has to be in the back of our minds when evaluating this longstanding shoulder and neck pain.  Follow-Up Instructions: Return for after MRI.   Orders:  Orders Placed This Encounter  Procedures   XR Shoulder Right   XR Cervical Spine 2 or 3 views   MR Cervical Spine w/o contrast   MR SHOULDER RIGHT W CONTRAST   Arthrogram   No orders of the defined types were placed in this encounter.     Procedures: No procedures performed   Clinical Data: No additional findings.  Objective: Vital Signs: There were no vitals taken for this visit.  Physical Exam:   Constitutional: Patient appears well-developed HEENT:  Head: Normocephalic Eyes:EOM are normal Neck: Normal range of motion Cardiovascular: Normal rate Pulmonary/chest: Effort normal Neurologic: Patient is alert Skin: Skin is warm Psychiatric: Patient has normal mood and affect   Ortho Exam: Ortho exam demonstrates good cervical spine range of motion.  5 out of 5 grip EPL FPL interosseous wrist flexion extension bicep triceps and deltoid strength.  Radial pulse intact bilaterally.  Reflexes symmetric 0 to 1+ out of 4 bilateral  biceps triceps.  Patient has on the right passive range of motion of 50/95/175.  Mild AC joint tenderness on the right none on the left.  Slight amount of crepitus with internal ex rotation at 90 degrees of abduction on the right.  No masses lymphadenopathy or skin changes noted in the shoulder girdle region  Specialty Comments:  No specialty comments available.  Imaging: No results found.   PMFS History: Patient Active Problem List   Diagnosis Date Noted   GERD (gastroesophageal reflux disease) 01/13/2019   History of left radical nephrectomy 01/13/2019   Renal cell carcinoma of left kidney ([place]) 12/07/2017   Prolapsed  internal hemorrhoids, grade 2 06/19/2017   Pure hypercholesterolemia 03/30/2017   History of cocaine abuse ([place]) 03/23/2017   Past Medical History:  Diagnosis Date   Cancer ([place])    Left kidney   Hyperlipidemia    Internal hemorrhoids    Polycystic liver disease    Substance abuse ([place])     Family History  Problem Relation Age of Onset   Diabetes Mother    Hypertension Mother    Alcohol abuse Mother    Diabetes Father    Kidney disease Father    Diabetes Sister    Stroke Sister    Diabetes Paternal Grandmother    Cancer Paternal Grandfather        type unknown   Colon cancer Neg Hx     Past Surgical History:  Procedure Laterality Date   COLONOSCOPY     HEMORRHOID BANDING     ROBOT ASSISTED LAPAROSCOPIC NEPHRECTOMY Left 12/07/2017   Procedure: XI ROBOTIC ASSISTED LAPAROSCOPIC NEPHRECTOMY;  Surgeon: Cleon Gustin, MD;  Location: [facility];  Service: Urology;  Laterality: Left;   Social History   Occupational History    Employer: [employer]  Tobacco Use   Smoking status: Former    Types: Cigarettes    Quit date: 12/03/2012    Years since quitting: 8.9   Smokeless tobacco: Never  Vaping Use   Vaping Use: Never used  Substance and Sexual Activity   Alcohol use: Yes    Comment: Occasional   Drug use: Yes    Types: Marijuana    Comment: was addicted to cocaine for 23 yrs but clean now for about  12-13 yrs; last  marijuana  use was this mo rning    Sexual activity: Yes

## 2021-11-09 ENCOUNTER — Other Ambulatory Visit (INDEPENDENT_AMBULATORY_CARE_PROVIDER_SITE_OTHER): Payer: Self-pay | Admitting: Primary Care

## 2021-11-09 DIAGNOSIS — E7841 Elevated Lipoprotein(a): Secondary | ICD-10-CM

## 2021-11-10 NOTE — Telephone Encounter (Signed)
Requested medication (s) are due for refill today: yes  Requested medication (s) are on the active medication list: yes  Last refill:  01/08/21 #90 1 RF  Future visit scheduled: no  Notes to clinic:  Needs lab work   Requested Prescriptions  Pending Prescriptions Disp Refills   atorvastatin (LIPITOR) 40 MG tablet [Pharmacy Med Name: ATORVASTATIN 40MG  TABLETS] 90 tablet 1    Sig: TAKE 1 TABLET(40 MG) BY MOUTH DAILY     Cardiovascular:  Antilipid - Statins Failed - 11/09/2021 12:12 PM      Failed - Total Cholesterol in normal range and within 360 days    Cholesterol, Total  Date Value Ref Range Status  10/26/2019 153 100 - 199 mg/dL Final          Failed - LDL in normal range and within 360 days    LDL Chol Calc (NIH)  Date Value Ref Range Status  10/26/2019 92 0 - 99 mg/dL Final          Failed - HDL in normal range and within 360 days    HDL  Date Value Ref Range Status  10/26/2019 37 (L) >39 mg/dL Final          Failed - Triglycerides in normal range and within 360 days    Triglycerides  Date Value Ref Range Status  10/26/2019 137 0 - 149 mg/dL Final          Passed - Patient is not pregnant      Passed - Valid encounter within last 12 months    Recent Outpatient Visits           1 month ago Pain of both shoulder joints   [place], Michelle P, NP   9 months ago Low back pain with right-sided sciatica, unspecified back pain laterality, unspecified chronicity   [facility] Juluis Mire P, NP   12 months ago Chronic right-sided low back pain, unspecified whether sciatica present   [place] Kerin Perna, NP   1 year ago Effusion of right knee   [facility] Kerin Perna, NP   1 year ago Effusion of right knee   [place], [place], NP       Future Appointments             In 3 weeks Marlou Sa, Tonna Corner, MD [place]   In 1 month Kerin Perna, NP [place]   In 9 months McKenzie, Candee Furbish, MD [place]

## 2021-11-11 NOTE — Telephone Encounter (Signed)
Sent to PCP ?

## 2021-11-15 ENCOUNTER — Other Ambulatory Visit: Payer: Self-pay

## 2021-11-15 ENCOUNTER — Encounter (HOSPITAL_COMMUNITY): Payer: Self-pay | Admitting: *Deleted

## 2021-11-15 ENCOUNTER — Emergency Department (HOSPITAL_COMMUNITY): Payer: Medicaid Other

## 2021-11-15 ENCOUNTER — Emergency Department (HOSPITAL_COMMUNITY)
Admission: EM | Admit: 2021-11-15 | Discharge: 2021-11-15 | Disposition: A | Discharge status: Home / Self Care | Payer: Medicaid Other | Attending: Emergency Medicine | Admitting: Emergency Medicine

## 2021-11-15 DIAGNOSIS — Z85528 Personal history of other malignant neoplasm of kidney: Secondary | ICD-10-CM | POA: Diagnosis not present

## 2021-11-15 DIAGNOSIS — K219 Gastro-esophageal reflux disease without esophagitis: Secondary | ICD-10-CM | POA: Diagnosis not present

## 2021-11-15 DIAGNOSIS — N3289 Other specified disorders of bladder: Secondary | ICD-10-CM | POA: Diagnosis not present

## 2021-11-15 DIAGNOSIS — Z87891 Personal history of nicotine dependence: Secondary | ICD-10-CM | POA: Diagnosis not present

## 2021-11-15 DIAGNOSIS — R1031 Right lower quadrant pain: Secondary | ICD-10-CM | POA: Insufficient documentation

## 2021-11-15 DIAGNOSIS — M160 Bilateral primary osteoarthritis of hip: Secondary | ICD-10-CM | POA: Diagnosis not present

## 2021-11-15 DIAGNOSIS — R109 Unspecified abdominal pain: Secondary | ICD-10-CM | POA: Diagnosis not present

## 2021-11-15 DIAGNOSIS — R350 Frequency of micturition: Secondary | ICD-10-CM | POA: Diagnosis not present

## 2021-11-15 DIAGNOSIS — K7689 Other specified diseases of liver: Secondary | ICD-10-CM | POA: Diagnosis not present

## 2021-11-15 DIAGNOSIS — K828 Other specified diseases of gallbladder: Secondary | ICD-10-CM | POA: Diagnosis not present

## 2021-11-15 LAB — CBC
HCT: 49.3 % (ref 39.0–52.0)
Hemoglobin: 16.1 g/dL (ref 13.0–17.0)
MCH: 30.4 pg (ref 26.0–34.0)
MCHC: 32.7 g/dL (ref 30.0–36.0)
MCV: 93.2 fL (ref 80.0–100.0)
Platelets: 292 10*3/uL (ref 150–400)
RBC: 5.29 MIL/uL (ref 4.22–5.81)
RDW: 13.9 % (ref 11.5–15.5)
WBC: 4.9 10*3/uL (ref 4.0–10.5)
nRBC: 0 % (ref 0.0–0.2)

## 2021-11-15 LAB — COMPREHENSIVE METABOLIC PANEL
ALT: 24 U/L (ref 0–44)
AST: 39 U/L (ref 15–41)
Albumin: 3.7 g/dL (ref 3.5–5.0)
Alkaline Phosphatase: 84 U/L (ref 38–126)
Anion gap: 8 (ref 5–15)
BUN: 20 mg/dL (ref 6–20)
CO2: 26 mmol/L (ref 22–32)
Calcium: 9.2 mg/dL (ref 8.9–10.3)
Chloride: 102 mmol/L (ref 98–111)
Creatinine, Ser: 2.16 mg/dL — ABNORMAL HIGH (ref 0.61–1.24)
GFR, Estimated: 35 mL/min — ABNORMAL LOW (ref >60)
Glucose, Bld: 113 mg/dL — ABNORMAL HIGH (ref 70–99)
Potassium: 4.8 mmol/L (ref 3.5–5.1)
Sodium: 136 mmol/L (ref 135–145)
Total Bilirubin: 0.8 mg/dL (ref 0.3–1.2)
Total Protein: 7.8 g/dL (ref 6.5–8.1)

## 2021-11-15 LAB — URINALYSIS, ROUTINE W REFLEX MICROSCOPIC
Bacteria, UA: NONE SEEN
Bilirubin Urine: NEGATIVE
Glucose, UA: NEGATIVE mg/dL
Hgb urine dipstick: NEGATIVE
Ketones, ur: NEGATIVE mg/dL
Leukocytes,Ua: NEGATIVE
Nitrite: NEGATIVE
Protein, ur: 100 mg/dL — AB
Specific Gravity, Urine: 1.031 — ABNORMAL HIGH (ref 1.005–1.030)
pH: 5 (ref 5.0–8.0)

## 2021-11-15 LAB — LIPASE, BLOOD: Lipase: 44 U/L (ref 11–51)

## 2021-11-15 MED ORDER — HYDROCODONE-ACETAMINOPHEN 5-325 MG PO TABS: 1.0000 | ORAL_TABLET | Freq: Four times a day (QID) | ORAL | 0 refills | Status: DC | PRN

## 2021-11-15 MED ORDER — HYDROCODONE-ACETAMINOPHEN 5-325 MG PO TABS
2.0000 | ORAL_TABLET | Freq: Once | ORAL | Status: AC
  Administered 2021-11-15: 2 via ORAL
  Filled 2021-11-15: qty 2

## 2021-11-15 NOTE — ED Provider Notes (Signed)
  Physical Exam  BP (!) 136/98 (BP Location: Right Arm)   Pulse 81   Temp 97.9 F (36.6 C) (Oral)   Resp 18   Ht 5\' 9"  (1.753 m)   Wt 91.2 kg   SpO2 100%   BMI 29.68 kg/m   Physical Exam  ED Course/Procedures     Procedures  MDM  Received in signout.  Left flank/abdominal pain.  Previous nephrectomy on the side.  CT scan reassuring without clear cause of pain.  No rash seen although could be early zoster.  Creatinine increased also.  No obstruction.  Will hydrate.  Has not been on any NSAIDs.  Follow-up with his nephrologist.  Discharge home       Davonna Belling, MD 11/15/21 [phone]

## 2021-11-15 NOTE — ED Provider Notes (Signed)
[facility] EMERGENCY DEPARTMENT Provider Note   CSN: [id number] Arrival date & time: 11/15/21  0076     History Chief Complaint  Patient presents with   Abdominal Pain    Garrett Clark is a 60 y.o. male.  Patient is a 60 year old male with past medical history of renal cell carcinoma with previous left-sided nephrectomy, polycystic liver disease, hyperlipidemia.  Patient presenting today for evaluation of right flank pain.  He tells me that this has been occurring intermittently for many months, but these episodes have increased in frequency over the past 2 days.  He describes a sharp pain that radiates from his flank into his lower abdomen.  This happens randomly and is not related to exertion or movement.  He denies to me he is having any dysuria or hematuria.  He denies any bowel complaints.  The history is provided by the patient.  Abdominal Pain Pain location:  R flank and RLQ Pain quality: stabbing   Pain radiates to:  RLQ Pain severity:  Severe Timing:  Intermittent Progression:  Worsening Relieved by:  Nothing Worsened by:  Nothing     Past Medical History:  Diagnosis Date   Cancer ([place])    Left kidney   Hyperlipidemia    Internal hemorrhoids    Polycystic liver disease    Substance abuse [facility])     Patient Active Problem List   Diagnosis Date Noted   GERD (gastroesophageal reflux disease) 01/13/2019   History of left radical nephrectomy 01/13/2019   Renal cell carcinoma of left kidney ([place]) 12/07/2017   Prolapsed internal hemorrhoids, grade 2 06/19/2017   Pure hypercholesterolemia 03/30/2017   History of cocaine abuse ([place]) 03/23/2017    Past Surgical History:  Procedure Laterality Date   COLONOSCOPY     HEMORRHOID BANDING     ROBOT ASSISTED LAPAROSCOPIC NEPHRECTOMY Left 12/07/2017   Procedure: XI ROBOTIC ASSISTED LAPAROSCOPIC NEPHRECTOMY;  Surgeon: Cleon Gustin, MD;  Location: [facility];  Service: Urology;  Laterality: Left;        Family History  Problem Relation Age of Onset   Diabetes Mother    Hypertension Mother    Alcohol abuse Mother    Diabetes Father    Kidney disease Father    Diabetes Sister    Stroke Sister    Diabetes Paternal Grandmother    Cancer Paternal Grandfather        type unknown   Colon cancer Neg Hx     Social History   Tobacco Use   Smoking status: Former    Types: Cigarettes    Quit date: 12/03/2012    Years since quitting: 8.9   Smokeless tobacco: Never  Vaping Use   Vaping Use: Never used  Substance Use Topics   Alcohol use: Yes    Comment: Occasional   Drug use: Yes    Types: Marijuana    Comment: was addicted to cocaine for 23 yrs but clean now for about  12-13 yrs; last  marijuana  use was this mo rning     Home Medications Prior to Admission medications   Medication Sig Start Date End Date Taking? Authorizing Provider  acetaminophen (TYLENOL) 500 MG tablet Take 1 tablet (500 mg total) by mouth every 6 (six) hours as needed. Patient taking differently: Take 500 mg by mouth every 6 (six) hours as needed for moderate pain or headache. 08/09/20  Yes Darr, Edison Nasuti, PA-C  atorvastatin (LIPITOR) 40 MG tablet TAKE 1 TABLET(40 MG) BY MOUTH DAILY  Patient taking differently: Take 40 mg by mouth daily. 01/08/21  Yes Kerin Perna, NP  cyclobenzaprine (FLEXERIL) 10 MG tablet TAKE 1 TABLET(10 MG) BY MOUTH THREE TIMES DAILY AS NEEDED FOR MUSCLE SPASMS Patient taking differently: Take 10 mg by mouth 3 (three) times daily as needed for muscle spasms. 08/28/21  Yes McKenzie, Candee Furbish, MD  gabapentin (NEURONTIN) 300 MG capsule Take 1 capsule 300mg  by mouth twice dailyTAKE 1 CAPSULE(300 MG) BY MOUTH TWICE DAILY Patient taking differently: Take 300 mg by mouth 2 (two) times daily. 10/10/21  Yes Kerin Perna, NP  omeprazole (PRILOSEC) 20 MG capsule Take 1 capsule (20 mg total) by mouth daily. Patient not taking: Reported on 11/15/2021 10/10/21   Kerin Perna, NP     Allergies    Patient has no known allergies.  Review of Systems   Review of Systems  Gastrointestinal:  Positive for abdominal pain.  All other systems reviewed and are negative.  Physical Exam Updated Vital Signs BP (!) 146/97   Pulse 85   Temp 98 F (36.7 C)   Resp 20   Ht 5\' 9"  (1.753 m)   Wt 91.2 kg   SpO2 98%   BMI 29.68 kg/m   Physical Exam Vitals and nursing note reviewed.  Constitutional:      General: He is not in acute distress.    Appearance: He is well-developed. He is not diaphoretic.  HENT:     Head: Normocephalic and atraumatic.  Cardiovascular:     Rate and Rhythm: Normal rate and regular rhythm.     Heart sounds: No murmur heard.   No friction rub.  Pulmonary:     Effort: Pulmonary effort is normal. No respiratory distress.     Breath sounds: Normal breath sounds. No wheezing or rales.  Abdominal:     General: Bowel sounds are normal. There is no distension.     Palpations: Abdomen is soft.     Tenderness: There is no abdominal tenderness. There is right CVA tenderness. There is no left CVA tenderness, guarding or rebound.  Musculoskeletal:        General: Normal range of motion.     Cervical back: Normal range of motion and neck supple.  Skin:    General: Skin is warm and dry.  Neurological:     Mental Status: He is alert and oriented to person, place, and time.     Coordination: Coordination normal.    ED Results / Procedures / Treatments   Labs (all labs ordered are listed, but only abnormal results are displayed) Labs Reviewed  URINALYSIS, ROUTINE W REFLEX MICROSCOPIC - Abnormal; Notable for the following components:      Result Value   Specific Gravity, Urine 1.031 (*)    Protein, ur 100 (*)    All other components within normal limits  CBC  LIPASE, BLOOD  COMPREHENSIVE METABOLIC PANEL    EKG None  Radiology No results found.  Procedures Procedures   Medications Ordered in ED Medications  HYDROcodone-acetaminophen  (NORCO/VICODIN) 5-325 MG per tablet 2 tablet (has no administration in time range)    ED Course  I have reviewed the triage vital signs and the nursing notes.  Pertinent labs & imaging results that were available during my care of the patient were reviewed by me and considered in my medical decision making (see chart for details).    MDM Rules/Calculators/A&P  Patient presenting with right-sided flank pain in the setting of previous left-sided nephrectomy for renal  cell carcinoma.  Patient's urinalysis is clear and laboratory studies unremarkable with the exception of a creatinine of 2.1 which is slightly above his baseline.  Patient to undergo renal CT to rule out renal calculus as source of his pain and bump in renal function.  Patient is awaiting this study and care will be signed out to Dr. Alvino Chapel at shift change.  Final Clinical Impression(s) / ED Diagnoses Final diagnoses:  None    Rx / DC Orders ED Discharge Orders     None        Veryl Speak, MD 11/16/21 [id number]

## 2021-11-15 NOTE — Discharge Instructions (Addendum)
Your kidney function looks mildly worse than before.  Try and keep yourself hydrated.  Follow-up with your nephrologist

## 2021-11-15 NOTE — ED Triage Notes (Signed)
C/o RUQ pain onset 1 year ago , however is coming more freq. States the pain radiates into his right flank area sometimes. Denies urinary sx. Denies n/v

## 2021-11-29 ENCOUNTER — Ambulatory Visit
Admission: RE | Admit: 2021-11-29 | Discharge: 2021-11-29 | Disposition: A | Discharge status: Home / Self Care | Payer: Medicaid Other | Source: Ambulatory Visit | Attending: Orthopedic Surgery | Admitting: Orthopedic Surgery

## 2021-11-29 ENCOUNTER — Other Ambulatory Visit: Payer: Medicaid Other

## 2021-11-29 DIAGNOSIS — M79601 Pain in right arm: Secondary | ICD-10-CM

## 2021-11-29 DIAGNOSIS — Z85528 Personal history of other malignant neoplasm of kidney: Secondary | ICD-10-CM | POA: Diagnosis not present

## 2021-11-29 DIAGNOSIS — M50223 Other cervical disc displacement at C6-C7 level: Secondary | ICD-10-CM | POA: Diagnosis not present

## 2021-11-29 DIAGNOSIS — M4692 Unspecified inflammatory spondylopathy, cervical region: Secondary | ICD-10-CM | POA: Diagnosis not present

## 2021-11-29 DIAGNOSIS — S46011A Strain of muscle(s) and tendon(s) of the rotator cuff of right shoulder, initial encounter: Secondary | ICD-10-CM | POA: Diagnosis not present

## 2021-11-29 DIAGNOSIS — M5412 Radiculopathy, cervical region: Secondary | ICD-10-CM | POA: Diagnosis not present

## 2021-11-29 DIAGNOSIS — M50222 Other cervical disc displacement at C5-C6 level: Secondary | ICD-10-CM | POA: Diagnosis not present

## 2021-11-29 MED ORDER — IOPAMIDOL (ISOVUE-M 200) INJECTION 41%
12.0000 mL | Freq: Once | INTRAMUSCULAR | Status: AC
  Administered 2021-11-29: 12 mL via INTRA_ARTICULAR

## 2021-12-02 ENCOUNTER — Other Ambulatory Visit: Payer: Self-pay

## 2021-12-02 ENCOUNTER — Encounter: Payer: Self-pay | Admitting: Orthopedic Surgery

## 2021-12-02 ENCOUNTER — Ambulatory Visit (INDEPENDENT_AMBULATORY_CARE_PROVIDER_SITE_OTHER): Payer: Medicaid Other | Admitting: Surgical

## 2021-12-02 DIAGNOSIS — M792 Neuralgia and neuritis, unspecified: Secondary | ICD-10-CM

## 2021-12-02 DIAGNOSIS — M79601 Pain in right arm: Secondary | ICD-10-CM

## 2021-12-02 NOTE — Progress Notes (Signed)
Office Visit Note   Patient: Garrett Clark           Date of Birth: 12-13-1961           MRN: [id number] Visit Date: 12/02/2021 Requested by: Kerin Perna, NP [address] [place],  [place] [address] PCP: Kerin Perna, NP  Subjective: Chief Complaint  Patient presents with   Other     Scan review    HPI: Garrett Clark is a 60 y.o. male who presents to the office for MRI review. Patient denies any changes in symptoms.  Continues to complain mainly of diffuse lateral right shoulder pain that radiates down the arm into his fingertips.  He also describes shoulder blade pain.  In addition, reports increased right upper quadrant abdominal pain that is not debilitating for him.  He intends to discuss this with his PCP at his next appointment next week.  MRI results revealed: MR Cervical Spine w/o contrast  Result Date: 11/29/2021 CLINICAL DATA:  Right radicular arm pain, history of renal cell carcinoma EXAM: MRI CERVICAL SPINE WITHOUT CONTRAST TECHNIQUE: Multiplanar, multisequence MR imaging of the cervical spine was performed. No intravenous contrast was administered. COMPARISON:  04/06/2018 FINDINGS: Alignment: Physiologic. Vertebrae: No acute fracture or suspicious osseous lesion. Cord: Normal signal and morphology. Posterior Fossa, vertebral arteries, paraspinal tissues: Negative. Disc levels: C2-C3: No significant disc bulge. No spinal canal stenosis or neuroforaminal narrowing. C3-C4: No significant disc bulge. No spinal canal stenosis or neuroforaminal narrowing. C4-C5: No significant disc bulge. Left-greater-than-right facet arthropathy. No spinal canal stenosis. Mild left neural foraminal narrowing, which has progressed from the prior exam C5-C6: Mild disc bulge. No spinal canal stenosis. Mild facet and uncovertebral hypertrophy. Mild right greater than left neural foraminal narrowing, which have progressed slightly compared to the prior exam. C6-C7: Mild disc bulge.  No spinal canal stenosis. Mild facet arthropathy. Mild bilateral neural foraminal narrowing, which has progressed slightly on the right. C7-T1: No significant disc bulge. No spinal canal stenosis or neuroforaminal narrowing. IMPRESSION: 1. Mild degenerative changes, without spinal canal stenosis. 2. C5-C6 mild right-greater-than-left neural foraminal narrowing. 3. C4-C5 mild left neural foraminal narrowing. Electronically Signed   By: Merilyn Baba M.D.   On: 11/29/2021 19:57   MR SHOULDER RIGHT W CONTRAST  Result Date: 12/01/2021 CLINICAL DATA:  Right shoulder pain. EXAM: MR ARTHROGRAM OF THE RIGHT SHOULDER TECHNIQUE: Multiplanar, multisequence MR imaging of the right shoulder was performed following the administration of intra-articular contrast. CONTRAST:  See Injection Documentation. COMPARISON:  None. FINDINGS: Rotator cuff: Moderate tendinosis of the supraspinatus tendon. Moderate tendinosis of the infraspinatus tendon with a tiny partial-thickness articular surface tear. Teres minor tendon is intact. Subscapularis tendon is intact. Muscles: No muscle atrophy or edema. No intramuscular fluid collection or hematoma. Biceps Long Head: Intraarticular and extraarticular portions of the biceps tendon are intact. Acromioclavicular Joint: Moderate arthropathy of the acromioclavicular joint. No subacromial/subdeltoid bursal fluid. Glenohumeral Joint: Intraarticular contrast distending the joint capsule. Normal glenohumeral ligaments. No chondral defect. Labrum: Small superior labral tear. Bones: No fracture or dislocation. No aggressive osseous lesion. Subcortical reactive marrow changes at the infraspinatus insertion. Other: No fluid collection or hematoma. IMPRESSION: 1. Moderate tendinosis of the supraspinatus tendon. 2. Moderate tendinosis of the infraspinatus tendon with a tiny partial-thickness articular surface tear. Electronically Signed   By: Kathreen Devoid M.D.   On: 12/01/2021 07:34                  ROS: All systems reviewed are  negative as they relate to the chief complaint within the history of present illness.  Patient denies fevers or chills.  Assessment & Plan: Visit Diagnoses:  1. Radicular pain in right arm     Plan: Garrett Clark is a 60 y.o. male who presents to the office for repeat evaluation of right shoulder pain.  MRI of the right shoulder was obtained that demonstrates moderate tendinosis with a very tiny partial-thickness tear of the infraspinatus.  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  No definitively operative pathology in the shoulder based on scan and exam.  Most of his symptoms seem to be cervical spine related with mild right greater than left neuroforaminal narrowing at C5-C6 and C6-C7.  After discussion of options, patient would like to try cervical spine ESI's.  Plan to refer to Dr. Ernestina Patches.  Follow-up if no improvement after injection.  Follow-Up Instructions: No follow-ups on file.   Orders:  No orders of the defined types were placed in this encounter.  No orders of the defined types were placed in this encounter.     Procedures: No procedures performed   Clinical Data: No additional findings.  Objective: Vital Signs: There were no vitals taken for this visit.  Physical Exam:  Constitutional: Patient appears well-developed HEENT:  Head: Normocephalic Eyes:EOM are normal Neck: Normal range of motion Cardiovascular: Normal rate Pulmonary/chest: Effort normal Neurologic: Patient is alert Skin: Skin is warm Psychiatric: Patient has normal mood and affect  Specialty Comments:  No specialty comments available.  Imaging: No results found.   PMFS History: Patient Active Problem List   Diagnosis Date Noted   GERD (gastroesophageal reflux disease) 01/13/2019   History of left radical nephrectomy 01/13/2019   Renal cell carcinoma of left kidney ([place]) 12/07/2017   Prolapsed internal hemorrhoids,  grade 2 06/19/2017   Pure hypercholesterolemia 03/30/2017   History of cocaine abuse ([place]) 03/23/2017   Past Medical History:  Diagnosis Date   Cancer ([place])    Left kidney   Hyperlipidemia    Internal hemorrhoids    Polycystic liver disease    Substance abuse ([place])     Family History  Problem Relation Age of Onset   Diabetes Mother    Hypertension Mother    Alcohol abuse Mother    Diabetes Father    Kidney disease Father    Diabetes Sister    Stroke Sister    Diabetes Paternal Grandmother    Cancer Paternal Grandfather        type unknown   Colon cancer Neg Hx     Past Surgical History:  Procedure Laterality Date   COLONOSCOPY     HEMORRHOID BANDING     ROBOT ASSISTED LAPAROSCOPIC NEPHRECTOMY Left 12/07/2017   Procedure: XI ROBOTIC ASSISTED LAPAROSCOPIC NEPHRECTOMY;  Surgeon: Cleon Gustin, MD;  Location: [facility];  Service: Urology;  Laterality: Left;   Social History   Occupational History    Employer: [employer]  Tobacco Use   Smoking status: Former    Types: Cigarettes    Quit date: 12/03/2012    Years since quitting: 9.0   Smokeless tobacco: Never  Vaping Use   Vaping Use: Never used  Substance and Sexual Activity   Alcohol use: Yes    Comment: Occasional   Drug use: Yes    Types: Marijuana    Comment: was addicted to cocaine for 23 yrs but clean now for about  12-13 yrs; last  marijuana  use  was this mo rning    Sexual activity: Yes

## 2021-12-06 NOTE — Progress Notes (Signed)
Does he have f/u?

## 2021-12-10 ENCOUNTER — Other Ambulatory Visit: Payer: Self-pay

## 2021-12-10 ENCOUNTER — Ambulatory Visit (INDEPENDENT_AMBULATORY_CARE_PROVIDER_SITE_OTHER): Payer: Medicaid Other | Admitting: Primary Care

## 2021-12-10 ENCOUNTER — Encounter (INDEPENDENT_AMBULATORY_CARE_PROVIDER_SITE_OTHER): Payer: Self-pay | Admitting: Primary Care

## 2021-12-10 VITALS — BP 150/97 | HR 64 | Temp 97.9°F | Ht 69.0 in | Wt 198.6 lb

## 2021-12-10 DIAGNOSIS — E7800 Pure hypercholesterolemia, unspecified: Secondary | ICD-10-CM | POA: Diagnosis not present

## 2021-12-10 DIAGNOSIS — R030 Elevated blood-pressure reading, without diagnosis of hypertension: Secondary | ICD-10-CM

## 2021-12-10 DIAGNOSIS — R1013 Epigastric pain: Secondary | ICD-10-CM | POA: Diagnosis not present

## 2021-12-10 NOTE — Progress Notes (Signed)
[facility]    Subjective:     [name] is a 60 y.o. male who presents for re-evaluation of abdominal pain. Since placed on omeprazole has not " really hurt ed". Blood pressure is elevated has been elevated in the past with no diagnosis of hypertension.  Today he is overwhelmed and stressed he buried his oldest brother on Sunday and they were very close.  Also, baby mama also causing drama.  Which he feels is a columns of elevated blood pressure.  Will reevaluate in 6 to 8 weeks then determine if medication is needed.  Requesting a refill on cholesterol medication he has eaten today we will obtain labs nonfasting.  The patient's history has been marked as reviewed and updated as appropriate. allergies, current medications, past family history, past medical history, past social history, and past surgical history Past Medical History:  Diagnosis Date   Cancer ([place])    Left kidney   Hyperlipidemia    Internal hemorrhoids    Polycystic liver disease    Substance abuse [facility])    Patient Active Problem List   Diagnosis Date Noted   GERD (gastroesophageal reflux disease) 01/13/2019   History of left radical nephrectomy 01/13/2019   Renal cell carcinoma of left kidney ([place]) 12/07/2017   Prolapsed internal hemorrhoids, grade 2 06/19/2017   Pure hypercholesterolemia 03/30/2017   History of cocaine abuse ([place]) 03/23/2017   Past Surgical History:  Procedure Laterality Date   COLONOSCOPY     HEMORRHOID BANDING     ROBOT ASSISTED LAPAROSCOPIC NEPHRECTOMY Left 12/07/2017   Procedure: XI ROBOTIC ASSISTED LAPAROSCOPIC NEPHRECTOMY;  Surgeon: Cleon Gustin, MD;  Location: [facility];  Service: Urology;  Laterality: Left;   Family History  Problem Relation Age of Onset   Diabetes Mother    Hypertension Mother    Alcohol abuse Mother    Diabetes Father    Kidney disease Father    Diabetes Sister    Stroke Sister    Diabetes Paternal Grandmother    Cancer  Paternal Grandfather        type unknown   Colon cancer Neg Hx    Social History   Socioeconomic History   Marital status: Single    Spouse name: Not on file   Number of children: 2   Years of education: 12   Highest education level: Not on file  Occupational History    Employer: [employer]  Tobacco Use   Smoking status: Former    Types: Cigarettes    Quit date: 12/03/2012    Years since quitting: 9.0   Smokeless tobacco: Never  Vaping Use   Vaping Use: Never used  Substance and Sexual Activity   Alcohol use: Yes    Comment: Occasional   Drug use: Yes    Types: Marijuana    Comment: was addicted to cocaine for 23 yrs but clean now for about  12-13 yrs; last  marijuana  use was this mo rning    Sexual activity: Yes  Other Topics Concern   Not on file  Social History Narrative   Lives with girlfriend and 59 year old daughter.  Has 2 children.  Works at [employer].  Education: high school.    Social Determinants of Health   Financial Resource Strain: Not on file  Food Insecurity: Not on file  Transportation Needs: Not on file  Physical Activity: Not on file  Stress: Not on file  Social Connections: Not on file   Current Outpatient  Medications  Medication Sig Dispense Refill   acetaminophen (TYLENOL) 500 MG tablet Take 1 tablet (500 mg total) by mouth every 6 (six) hours as needed. (Patient taking differently: Take 500 mg by mouth every 6 (six) hours as needed for moderate pain or headache.) 30 tablet 0   atorvastatin (LIPITOR) 40 MG tablet TAKE 1 TABLET(40 MG) BY MOUTH DAILY (Patient taking differently: Take 40 mg by mouth daily.) 90 tablet 1   cyclobenzaprine (FLEXERIL) 10 MG tablet TAKE 1 TABLET(10 MG) BY MOUTH THREE TIMES DAILY AS NEEDED FOR MUSCLE SPASMS (Patient taking differently: Take 10 mg by mouth 3 (three) times daily as needed for muscle spasms.) 90 tablet 2   gabapentin (NEURONTIN) 300 MG capsule Take 1 capsule 300mg  by mouth twice dailyTAKE 1 CAPSULE(300 MG) BY  MOUTH TWICE DAILY (Patient taking differently: Take 300 mg by mouth 2 (two) times daily.) 180 capsule 3   HYDROcodone-acetaminophen (NORCO/VICODIN) 5-325 MG tablet Take 1-2 tablets by mouth every 6 (six) hours as needed. 6 tablet 0   omeprazole (PRILOSEC) 20 MG capsule Take 1 capsule (20 mg total) by mouth daily. 30 capsule 3   No current facility-administered medications for this visit.   Allergies: Patient has no known allergies.  Review of Systems Pertinent items noted in HPI and remainder of comprehensive ROS otherwise negative.     Objective:    BP (!) 150/97 (BP Location: Left Arm, Patient Position: Sitting, Cuff Size: Large)    Pulse 64    Temp 97.9 F (36.6 C) (Temporal)    Ht 5\' 9"  (1.753 m)    Wt 198 lb 9.6 oz (90.1 kg)    SpO2 98%    BMI 29.33 kg/m  General appearance: alert, cooperative, appears stated age, distracted, and mild distress Head: Normocephalic, without obvious abnormality, atraumatic Eyes: conjunctivae/corneas clear. PERRL, EOM's intact. Fundi benign. Neck: no adenopathy, no carotid bruit, no JVD, supple, symmetrical, trachea midline, and thyroid not enlarged, symmetric, no tenderness/mass/nodules Back: symmetric, no curvature. ROM normal. No CVA tenderness. Lungs: clear to auscultation bilaterally Heart: regular rate and rhythm, S1, S2 normal, no murmur, click, rub or gallop Abdomen: soft, non-tender; bowel sounds normal; no masses,  no organomegaly Extremities: extremities normal, atraumatic, no cyanosis or edema Pulses: 2+ and symmetric Skin: Skin color, texture, turgor normal. No rashes or lesions Lymph nodes: Cervical, supraclavicular, and axillary nodes normal. Neurologic: Alert and oriented X 3, normal strength and tone. Normal symmetric reflexes. Normal coordination and gait    Assessment:  Garrett Clark was seen today for medication refill.  Diagnoses and all orders for this visit:  Pure hypercholesterolemia Will check lipids prior to refilling statin.   Encouraged to have a healthy lifestyle diet of fruits vegetables fish nuts whole grains and low saturated fat . Foods high in cholesterol or liver, fatty meats,cheese, butter avocados, nuts and seeds, chocolate and fried foods. -     Lipid Panel  Epigastric pain Discussed eating small frequent meal, reduction in acidic foods, fried foods ,spicy foods, alcohol caffeine and tobacco and certain medications. Avoid laying down after eating 80mins-1hour, elevated head of the bed.   Elevated blood pressure reading without diagnosis of hypertension Counseled on blood pressure goal of less than 130/80, low-sodium, DASH diet, medication compliance, 150 minutes of moderate intensity exercise per week. Discussed we will start with diet modifications and exercise if blood pressure continues to be elevated we will consider medication.  Juluis Mire, NP

## 2021-12-10 NOTE — Patient Instructions (Signed)
Managing Loss, Adult °People experience loss in many different ways throughout their lives. Events such as moving, changing jobs, and losing friends can create a sense of loss. The loss may be as serious as a major health change, divorce, death of a pet, or death of a loved one. All of these types of loss are likely to create a physical and emotional reaction known as grief. Grief is the result of a major change or an absence of something or someone that you count on. Grief is a normal reaction to loss. °A variety of factors can affect your grieving experience, including: °The nature of your loss. °Your relationship to what or whom you lost. °Your understanding of grief and how to manage it. °Your support system. °Be aware that when grief becomes extreme, it can lead to more severe issues like isolation, depression, anxiety, or suicidal thoughts. Talk with your health care provider if you have any of these issues. °How to manage lifestyle changes °Keep to your normal routine as much as possible. °If you have trouble focusing or doing normal activities, it is acceptable to take some time away from your normal routine. °Spend time with friends and loved ones. °Eat a healthy diet, get plenty of sleep, and rest when you feel tired. °How to recognize changes  °The way that you deal with your grief will affect your ability to function as you normally do. When grieving, you may experience these changes: °Numbness, shock, sadness, anxiety, anger, denial, and guilt. °Thoughts about death. °Unexpected crying. °A physical sensation of emptiness in your stomach. °Problems sleeping and eating. °Tiredness (fatigue). °Loss of interest in normal activities. °Dreaming about or imagining seeing the person who died. °A need to remember what or whom you lost. °Difficulty thinking about anything other than your loss for a period of time. °Relief. If you have been expecting the loss for a while, you may feel a sense of relief when it  happens. °Follow these instructions at home: °Activity °Express your feelings in healthy ways, such as: °Talking with others about your loss. It may be helpful to find others who have had a similar loss, such as a support group. °Writing down your feelings in a journal. °Doing physical activities to release stress and emotional energy. °Doing creative activities like painting, sculpting, or playing or listening to music. °Practicing resilience. This is the ability to recover and adjust after facing challenges. Reading some resources that encourage resilience may help you to learn ways to practice those behaviors. ° °General instructions °Be patient with yourself and others. Allow the grieving process to happen, and remember that grieving takes time. °It is likely that you may never feel completely done with some grief. You may find a way to move on while still cherishing memories and feelings about your loss. °Accepting your loss is a process. It can take months or longer to adjust. °Keep all follow-up visits. This is important. °Where to find support °To get support for managing loss: °Ask your health care provider for help and recommendations, such as grief counseling or therapy. °Think about joining a support group for people who are managing a loss. °Where to find more information °You can find more information about managing loss from: °American Society of Clinical Oncology: www.cancer.net °American Psychological Association: www.apa.org °Contact a health care provider if: °Your grief is extreme and keeps getting worse. °You have ongoing grief that does not improve. °Your body shows symptoms of grief, such as illness. °You feel depressed, anxious, or   hopeless. °Get help right away if: °You have thoughts about hurting yourself or others. °Get help right away if you feel like you may hurt yourself or others, or have thoughts about taking your own life. Go to your nearest emergency room or: °Call 911. °Call the  National Suicide Prevention Lifeline at 1-800-273-8255 or 988. This is open 24 hours a day. °Text the Crisis Text Line at 741741. °Summary °Grief is the result of a major change or an absence of someone or something that you count on. Grief is a normal reaction to loss. °The depth of grief and the period of recovery depend on the type of loss and your ability to adjust to the change and process your feelings. °Processing grief requires patience and a willingness to accept your feelings and talk about your loss with people who are supportive. °It is important to find resources that work for you and to realize that people experience grief differently. There is not one grieving process that works for everyone in the same way. °Be aware that when grief becomes extreme, it can lead to more severe issues like isolation, depression, anxiety, or suicidal thoughts. Talk with your health care provider if you have any of these issues. °This information is not intended to replace advice given to you by your health care provider. Make sure you discuss any questions you have with your health care provider. °Document Revised: 07/29/2021 Document Reviewed: 07/29/2021 °Elsevier Patient Education © 2022 Elsevier Inc. ° °

## 2021-12-10 NOTE — Progress Notes (Signed)
Need cholesterol medicine

## 2021-12-11 ENCOUNTER — Other Ambulatory Visit (INDEPENDENT_AMBULATORY_CARE_PROVIDER_SITE_OTHER): Payer: Self-pay | Admitting: Primary Care

## 2021-12-11 LAB — LIPID PANEL
Chol/HDL Ratio: 8.3 ratio — ABNORMAL HIGH (ref 0.0–5.0)
Cholesterol, Total: 266 mg/dL — ABNORMAL HIGH (ref 100–199)
HDL: 32 mg/dL — ABNORMAL LOW (ref >39)
LDL Chol Calc (NIH): 155 mg/dL — ABNORMAL HIGH (ref 0–99)
Triglycerides: 416 mg/dL — ABNORMAL HIGH (ref 0–149)
VLDL Cholesterol Cal: 79 mg/dL — ABNORMAL HIGH (ref 5–40)

## 2021-12-11 MED ORDER — FENOFIBRATE 48 MG PO TABS: 48.0000 mg | ORAL_TABLET | Freq: Every day | ORAL | 1 refills | Status: DC

## 2021-12-11 MED ORDER — ATORVASTATIN CALCIUM 80 MG PO TABS: 80.0000 mg | ORAL_TABLET | Freq: Every day | ORAL | 3 refills | Status: DC

## 2021-12-12 ENCOUNTER — Telehealth (INDEPENDENT_AMBULATORY_CARE_PROVIDER_SITE_OTHER): Payer: Self-pay

## 2021-12-12 NOTE — Telephone Encounter (Signed)
-----   Message from Kerin Perna, NP sent at 12/11/2021 10:02 AM EST ----- Your cholesterol is high, Increase risk of heart attack and/or stroke.  To reduce your Cholesterol , Remember - more fruits and vegetables, more fish, and limit red meat and dairy products. More soy, nuts, beans, barley, lentils, oats and plant sterol ester enriched margarine instead of butter. I also encourage eliminating sugar and processed food. New script sent for atorvastatin 80mg  and tricor 48mg  at betime

## 2021-12-12 NOTE — Telephone Encounter (Signed)
Patient is aware that cholesterol is elevated. Informed of medication being sent and to take at bedtime. Advised of dietary changes. He verbalized understanding. [name], CMA

## 2021-12-25 ENCOUNTER — Telehealth: Payer: Self-pay | Admitting: Orthopedic Surgery

## 2021-12-25 NOTE — Telephone Encounter (Signed)
Leah Investment banker, corporate) from [facility] called requesting relevant office visit note for a pre auth of procedure. Please fax notes to [phone]

## 2021-12-25 NOTE — Telephone Encounter (Signed)
Most recent office visit from 12/02/2021 was faxed as requested.

## 2022-01-21 ENCOUNTER — Ambulatory Visit (INDEPENDENT_AMBULATORY_CARE_PROVIDER_SITE_OTHER): Payer: Medicaid Other | Admitting: Primary Care

## 2022-01-21 ENCOUNTER — Encounter (INDEPENDENT_AMBULATORY_CARE_PROVIDER_SITE_OTHER): Payer: Self-pay | Admitting: Primary Care

## 2022-01-21 ENCOUNTER — Other Ambulatory Visit: Payer: Self-pay

## 2022-01-21 VITALS — BP 151/95 | HR 88 | Temp 97.6°F | Ht 69.0 in | Wt 203.0 lb

## 2022-01-21 DIAGNOSIS — I10 Essential (primary) hypertension: Secondary | ICD-10-CM | POA: Diagnosis not present

## 2022-01-21 MED ORDER — VALSARTAN-HYDROCHLOROTHIAZIDE 160-25 MG PO TABS: 1.0000 | ORAL_TABLET | Freq: Every day | ORAL | 1 refills | Status: DC

## 2022-01-21 NOTE — Patient Instructions (Signed)

## 2022-01-21 NOTE — Progress Notes (Signed)
Garrett Clark, is a 61 y.o. male  NTZ:[id number]  QPR:[id number]  DOB - July 21, 1961  Chief Complaint  Patient presents with   Blood Pressure Check       Subjective:   [name] is a 61 y.o. male here today for a follow up visit. Patient has No headache, No chest pain, No abdominal pain - No Nausea, No new weakness tingling or numbness, No Cough - SOB.  No problems updated.  ALLERGIES: No Known Allergies  PAST MEDICAL HISTORY: Past Medical History:  Diagnosis Date   Cancer ([place])    Left kidney   Hyperlipidemia    Internal hemorrhoids    Polycystic liver disease    Substance abuse ([place])     MEDICATIONS AT HOME: Prior to Admission medications   Medication Sig Start Date End Date Taking? Authorizing Provider  acetaminophen (TYLENOL) 500 MG tablet Take 1 tablet (500 mg total) by mouth every 6 (six) hours as needed. Patient taking differently: Take 500 mg by mouth every 6 (six) hours as needed for moderate pain or headache. 08/09/20  Yes Darr, Edison Nasuti, PA-C  atorvastatin (LIPITOR) 80 MG tablet Take 1 tablet (80 mg total) by mouth daily. 12/11/21  Yes Kerin Perna, NP  cyclobenzaprine (FLEXERIL) 10 MG tablet TAKE 1 TABLET(10 MG) BY MOUTH THREE TIMES DAILY AS NEEDED FOR MUSCLE SPASMS Patient taking differently: Take 10 mg by mouth 3 (three) times daily as needed for muscle spasms. 08/28/21  Yes McKenzie, Candee Furbish, MD  fenofibrate (TRICOR) 48 MG tablet Take 1 tablet (48 mg total) by mouth daily. 12/11/21  Yes Kerin Perna, NP  gabapentin (NEURONTIN) 300 MG capsule Take 1 capsule 300mg  by mouth twice dailyTAKE 1 CAPSULE(300 MG) BY MOUTH TWICE DAILY Patient taking differently: Take 300 mg by mouth 2 (two) times daily. 10/10/21  Yes Kerin Perna, NP  omeprazole (PRILOSEC) 20 MG capsule Take 1 capsule (20 mg total) by mouth daily. 10/10/21  Yes Kerin Perna, NP  valsartan-hydrochlorothiazide (DIOVAN-HCT) 160-25 MG tablet  Take 1 tablet by mouth daily. 01/21/22  Yes Kerin Perna, NP    Objective:   Vitals:   01/21/22 1343 01/21/22 1359  BP: (!) 157/98 (!) 151/95  Pulse: 74 88  Temp: 97.6 F (36.4 C)   TempSrc: Oral   SpO2: 97%   Weight: 203 lb (92.1 kg)   Height: 5\' 9"  (1.753 m)    Exam General appearance : Awake, alert, not in any distress. Speech Clear. Not toxic looking HEENT: Atraumatic and Normocephalic, pupils equally reactive to light and accomodation Neck: Supple, no JVD. No cervical lymphadenopathy.  Chest: Good air entry bilaterally, no added sounds  CVS: S1 S2 regular, no murmurs.  Abdomen: Bowel sounds present, Non tender and not distended with no gaurding, rigidity or rebound. Extremities: B/L Lower Ext shows no edema, both legs are warm to touch Neurology: Awake alert, and oriented X 3, CN II-XII intact, Non focal Skin: No Rash  Data Review No results found for: HGBA1C  Assessment & Plan  1. Essential hypertension Counseled on blood pressure goal of less than 130/80, low-sodium, DASH diet, medication compliance, 150 minutes of moderate intensity exercise per week. Discussed medication compliance, adverse effects.  - valsartan-hydrochlorothiazide (DIOVAN-HCT) 160-25 MG tablet; Take 1 tablet by mouth daily.  Dispense: 90 tablet; Refill: 1  Patient have been counseled extensively about nutrition and exercise. Other issues discussed during this visit include: low cholesterol diet, weight control and daily exercise, foot care, annual  eye examinations at Ophthalmology, importance of adherence with medications and regular follow-up. We also discussed long term complications of uncontrolled diabetes and hypertension.   Return in about 7 weeks (around 03/11/2022) for Bp/fasting labs.  The patient was given clear instructions to go to ER or return to medical center if symptoms don't improve, worsen or new problems develop. The patient verbalized understanding. The patient was told to  call to get lab results if they haven't heard anything in the next week.   This note has been created with Surveyor, quantity. Any transcriptional errors are unintentional.   Kerin Perna, NP 01/22/2022, 4:53 PM

## 2022-01-27 ENCOUNTER — Ambulatory Visit: Payer: Self-pay

## 2022-01-27 ENCOUNTER — Ambulatory Visit: Payer: Medicaid Other | Admitting: Physical Medicine and Rehabilitation

## 2022-01-27 ENCOUNTER — Other Ambulatory Visit: Payer: Self-pay

## 2022-01-27 ENCOUNTER — Encounter: Payer: Self-pay | Admitting: Physical Medicine and Rehabilitation

## 2022-01-27 VITALS — BP 120/89 | HR 76

## 2022-01-27 DIAGNOSIS — M25511 Pain in right shoulder: Secondary | ICD-10-CM

## 2022-01-27 DIAGNOSIS — M5416 Radiculopathy, lumbar region: Secondary | ICD-10-CM

## 2022-01-27 DIAGNOSIS — M5412 Radiculopathy, cervical region: Secondary | ICD-10-CM | POA: Diagnosis not present

## 2022-01-27 DIAGNOSIS — M5116 Intervertebral disc disorders with radiculopathy, lumbar region: Secondary | ICD-10-CM | POA: Diagnosis not present

## 2022-01-27 DIAGNOSIS — M5441 Lumbago with sciatica, right side: Secondary | ICD-10-CM | POA: Diagnosis not present

## 2022-01-27 DIAGNOSIS — G8929 Other chronic pain: Secondary | ICD-10-CM

## 2022-01-27 DIAGNOSIS — M542 Cervicalgia: Secondary | ICD-10-CM

## 2022-01-27 MED ORDER — METHYLPREDNISOLONE ACETATE 80 MG/ML IJ SUSP
80.0000 mg | Freq: Once | INTRAMUSCULAR | Status: AC
  Administered 2022-01-27: 80 mg

## 2022-01-27 NOTE — Progress Notes (Signed)
Garrett Clark - 61 y.o. male MRN [id number]  Date of birth: 1961/11/06  Office Visit Note: Visit Date: 01/27/2022 PCP: Kerin Perna, NP Referred by: Kerin Perna, NP  Subjective: Chief Complaint  Patient presents with   Lower Back - Pain   Right Hip - Pain   HPI:  Garrett Clark is a 61 y.o. male who comes in today at the request of G. Alphonzo Severance, MD I also treated at times by Annie Main, PA-C for what was going to be diagnostic and therapeutic cervical epidural injection.  More recently the patient has been having neck shoulder and arm pain that was felt to be possibly related to the cervical spine.  Cervical spine MRI showed mild degenerative changes overall with no focal stenosis or nerve compression.  Given the timing and failure of conservative care epidural injection was planned to see if it would help.  Patient comes in today however complaining of low back and right hip pain which she has had in the past and we have completed epidural injection for his lumbar spine.  The patient reports the right arm and shoulder are actually much better at this point he is not having much pain at all.  He reports no focal weakness no new symptoms falls or trauma.  He reports continued right lower back and hip pain that he said was relieved for 3 or 4 months with prior epidural injection which was a right L4 transforaminal injection.  He is complaining of some pain in the hips but no groin pain.  No pain past the knee however no paresthesias.  Review of Systems  Musculoskeletal:  Positive for back pain.       Right hip and thigh pain  All other systems reviewed and are negative. Otherwise per HPI.  Assessment & Plan: Visit Diagnoses:    ICD-10-CM   1. Lumbar radiculopathy  M54.16 XR C-ARM NO REPORT    Epidural Steroid injection    methylPREDNISolone acetate (DEPO-MEDROL) injection 80 mg    2. Chronic right-sided low back pain with right-sided sciatica  M54.41    G89.29      3. Radiculopathy due to lumbar intervertebral disc disorder  M51.16     4. Cervicalgia  M54.2     5. Chronic right shoulder pain  M25.511    G89.29       Plan: Findings:  1.  Chronic worsening severe low back and right hip pain which was resolved to a good degree with prior epidural injection of the lumbar spine for more than 3 months.  He has failed conservative care otherwise with medication management and therapy.  We will today complete diagnostic medically therapeutic right L4 transforaminal injection.  This was a repeat injection into the fact that helped before.  He will continue to follow-up with Dr. Anderson Malta.  2.  In terms of his right neck and shoulder pain he denies really any pain today in the right shoulder it is gotten a lot better since he saw Dr. Marlou Sa back in December.  He will continue to monitor this and we did review the MRI with him.   Meds & Orders:  Meds ordered this encounter  Medications   methylPREDNISolone acetate (DEPO-MEDROL) injection 80 mg    Orders Placed This Encounter  Procedures   XR C-ARM NO REPORT   Epidural Steroid injection    Follow-up: Return for visit to requesting provider as needed.   Procedures: No procedures performed  Lumbosacral Transforaminal Epidural Steroid Injection - Sub-Pedicular Approach with Fluoroscopic Guidance  Patient: Garrett Clark      Date of Birth: [birth year] MRN: [id number] PCP: Kerin Perna, NP      Visit Date: 01/27/2022   Universal Protocol:    Date/Time: 01/27/2022  Consent Given By: the patient  Position: PRONE  Additional Comments: Vital signs were monitored before and after the procedure. Patient was prepped and draped in the usual sterile fashion. The correct patient, procedure, and site was verified.   Injection Procedure Details:   Procedure diagnoses: Cervical radiculopathy [M54.12]    Meds Administered:  Meds ordered this encounter  Medications   methylPREDNISolone acetate  (DEPO-MEDROL) injection 80 mg    Laterality: Right  Location/Site: L4  Needle:5.0 in., 22 ga.  Short bevel or Quincke spinal needle  Needle Placement: Transforaminal  Findings:    -Comments: Excellent flow of contrast along the nerve, nerve root and into the epidural space.  Procedure Details: After squaring off the end-plates to get a true AP view, the C-arm was positioned so that an oblique view of the foramen as noted above was visualized. The target area is just inferior to the "nose of the scotty dog" or sub pedicular. The soft tissues overlying this structure were infiltrated with 2-3 ml. of 1% Lidocaine without Epinephrine.  The spinal needle was inserted toward the target using a "trajectory" view along the fluoroscope beam.  Under AP and lateral visualization, the needle was advanced so it did not puncture dura and was located close the 6 O'Clock position of the pedical in AP tracterory. Biplanar projections were used to confirm position. Aspiration was confirmed to be negative for CSF and/or blood. A 1-2 ml. volume of Isovue-250 was injected and flow of contrast was noted at each level. Radiographs were obtained for documentation purposes.   After attaining the desired flow of contrast documented above, a 0.5 to 1.0 ml test dose of 0.25% Marcaine was injected into each respective transforaminal space.  The patient was observed for 90 seconds post injection.  After no sensory deficits were reported, and normal lower extremity motor function was noted,   the above injectate was administered so that equal amounts of the injectate were placed at each foramen (level) into the transforaminal epidural space.   Additional Comments:  No complications occurred Dressing: 2 x 2 sterile gauze and Band-Aid    Post-procedure details: Patient was observed during the procedure. Post-procedure instructions were reviewed.  Patient left the clinic in stable condition.    Clinical  History: MRI CERVICAL SPINE WITHOUT CONTRAST   TECHNIQUE: Multiplanar, multisequence MR imaging of the cervical spine was performed. No intravenous contrast was administered.   COMPARISON:  04/06/2018   FINDINGS: Alignment: Physiologic.   Vertebrae: No acute fracture or suspicious osseous lesion.   Cord: Normal signal and morphology.   Posterior Fossa, vertebral arteries, paraspinal tissues: Negative.   Disc levels:   C2-C3: No significant disc bulge. No spinal canal stenosis or neuroforaminal narrowing.   C3-C4: No significant disc bulge. No spinal canal stenosis or neuroforaminal narrowing.   C4-C5: No significant disc bulge. Left-greater-than-right facet arthropathy. No spinal canal stenosis. Mild left neural foraminal narrowing, which has progressed from the prior exam   C5-C6: Mild disc bulge. No spinal canal stenosis. Mild facet and uncovertebral hypertrophy. Mild right greater than left neural foraminal narrowing, which have progressed slightly compared to the prior exam.   C6-C7: Mild disc bulge. No spinal canal stenosis. Mild facet arthropathy.  Mild bilateral neural foraminal narrowing, which has progressed slightly on the right.   C7-T1: No significant disc bulge. No spinal canal stenosis or neuroforaminal narrowing.   IMPRESSION: 1. Mild degenerative changes, without spinal canal stenosis. 2. C5-C6 mild right-greater-than-left neural foraminal narrowing. 3. C4-C5 mild left neural foraminal narrowing.     Electronically Signed By: Merilyn Baba M.D. On: 11/29/2021 19:57 -- MRI LUMBAR SPINE WITHOUT CONTRAST   FINDINGS: Segmentation: Standard. Lowest well-formed disc space labeled the L5-S1 level.   Alignment: Physiologic with preservation of the normal lumbar lordosis. No listhesis.   Vertebrae: Vertebral body height maintained without acute or chronic fracture. Bone marrow signal intensity within normal limits. No discrete or worrisome osseous  lesions. Mild reactive marrow edema noted about the left L4-5 facet due to facet arthritis (series 4, image 13). No other abnormal marrow edema.   Conus medullaris and cauda equina: Conus extends to the T12-L1 level. Conus and cauda equina appear normal.   Paraspinal and other soft tissues: Paraspinous soft tissues within normal limits. Few scattered benign appearing T2 hyperintense simple cyst noted about the right kidney. 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 Few additional scattered simple cyst noted within the visualized liver. Visualized visceral structures and retroperitoneal contents otherwise unremarkable.   Disc levels:   L1-2:  Unremarkable.   L2-3: Normal interspace. Minimal facet hypertrophy. No stenosis or impingement.   L3-4: Degenerative intervertebral disc space narrowing with diffuse disc bulge and disc desiccation. Mild facet and ligament flavum hypertrophy. No significant spinal stenosis. Mild bilateral L3 foraminal narrowing. No frank impingement.   L4-5: Minimal disc bulge. Subtle right foraminal to extraforaminal disc protrusion contacts the exiting right L4 nerve root as it courses of the right neural foramen (series 7, image 29). Finding could contribute to right-sided radicular symptoms. Mild facet and ligament flavum hypertrophy. No significant spinal stenosis. Mild bilateral L4 foraminal narrowing.   L5-S1: Mild diffuse disc bulge with disc desiccation and intervertebral disc space narrowing. Minimal facet hypertrophy. Epidural lipomatosis. No canal or lateral recess stenosis. Foramina remain patent.   IMPRESSION: 1. Subtle right foraminal to extraforaminal disc protrusion at L4-5, contacting and potentially irritating the exiting right L4 nerve root. Finding could contribute to right-sided radicular symptoms. 2. Additional mild disc bulging and facet hypertrophy at L3-4 and L5-S1 without significant stenosis or impingement. 3. Mild  reactive marrow edema about the left L4-5 facet due to facet arthritis. Finding could serve as a source for lower back pain.     Electronically Signed   By: Jeannine Boga M.D.   On: 01/17/2021 05:22     Objective:  VS:  HT:     WT:    BMI:      BP:120/89   HR:76bpm   TEMP: ( )   RESP:  Physical Exam Vitals and nursing note reviewed.  Constitutional:      General: He is not in acute distress.    Appearance: Normal appearance. He is not ill-appearing.  HENT:     Head: Normocephalic and atraumatic.     Right Ear: External ear normal.     Left Ear: External ear normal.     Nose: No congestion.  Eyes:     Extraocular Movements: Extraocular movements intact.  Cardiovascular:     Rate and Rhythm: Normal rate.     Pulses: Normal pulses.  Pulmonary:     Effort: Pulmonary effort is normal. No respiratory distress.  Abdominal:     General: There is no distension.  Palpations: Abdomen is soft.  Musculoskeletal:        General: No tenderness or signs of injury.     Cervical back: Neck supple.     Right lower leg: No edema.     Left lower leg: No edema.     Comments: Patient has good distal strength without clonus.  He has no pain over the greater trochanters and no pain with hip rotation.  He has good strength in the upper extremities he has some impingement signs of the right shoulder.  He has a negative Hoffmann's test bilaterally.  Skin:    Findings: No erythema or rash.  Neurological:     General: No focal deficit present.     Mental Status: He is alert and oriented to person, place, and time.     Sensory: No sensory deficit.     Motor: No weakness or abnormal muscle tone.     Coordination: Coordination normal.  Psychiatric:        Mood and Affect: Mood normal.        Behavior: Behavior normal.     Imaging: XR C-ARM NO REPORT  Result Date: 01/27/2022 Please see Notes tab for imaging impression.

## 2022-01-27 NOTE — Progress Notes (Signed)
Pt with more right hip pain than neck and arm pain.did repeat right L4 tf esi . Pt state any movement with his right arm makes the pain worse. Pt state she takes pain meds to help ease his pain.  Numeric Pain Rating Scale and Functional Assessment Average Pain 2   In the last MONTH (on 0-10 scale) has pain interfered with the following?  1. General activity like being  able to carry out your everyday physical activities such as walking, climbing stairs, carrying groceries, or moving a chair?  Rating(10)   +Driver, -BT, -Dye Allergies.

## 2022-01-28 NOTE — Procedures (Signed)
Lumbosacral Transforaminal Epidural Steroid Injection - Sub-Pedicular Approach with Fluoroscopic Guidance  Patient: Garrett Clark      Date of Birth: 07-Aug-1961 MRN: [id number] PCP: Kerin Perna, NP      Visit Date: 01/27/2022   Universal Protocol:    Date/Time: 01/27/2022  Consent Given By: the patient  Position: PRONE  Additional Comments: Vital signs were monitored before and after the procedure. Patient was prepped and draped in the usual sterile fashion. The correct patient, procedure, and site was verified.   Injection Procedure Details:   Procedure diagnoses: Cervical radiculopathy [M54.12]    Meds Administered:  Meds ordered this encounter  Medications   methylPREDNISolone acetate (DEPO-MEDROL) injection 80 mg    Laterality: Right  Location/Site: L4  Needle:5.0 in., 22 ga.  Short bevel or Quincke spinal needle  Needle Placement: Transforaminal  Findings:    -Comments: Excellent flow of contrast along the nerve, nerve root and into the epidural space.  Procedure Details: After squaring off the end-plates to get a true AP view, the C-arm was positioned so that an oblique view of the foramen as noted above was visualized. The target area is just inferior to the "nose of the scotty dog" or sub pedicular. The soft tissues overlying this structure were infiltrated with 2-3 ml. of 1% Lidocaine without Epinephrine.  The spinal needle was inserted toward the target using a "trajectory" view along the fluoroscope beam.  Under AP and lateral visualization, the needle was advanced so it did not puncture dura and was located close the 6 O'Clock position of the pedical in AP tracterory. Biplanar projections were used to confirm position. Aspiration was confirmed to be negative for CSF and/or blood. A 1-2 ml. volume of Isovue-250 was injected and flow of contrast was noted at each level. Radiographs were obtained for documentation purposes.   After attaining the  desired flow of contrast documented above, a 0.5 to 1.0 ml test dose of 0.25% Marcaine was injected into each respective transforaminal space.  The patient was observed for 90 seconds post injection.  After no sensory deficits were reported, and normal lower extremity motor function was noted,   the above injectate was administered so that equal amounts of the injectate were placed at each foramen (level) into the transforaminal epidural space.   Additional Comments:  No complications occurred Dressing: 2 x 2 sterile gauze and Band-Aid    Post-procedure details: Patient was observed during the procedure. Post-procedure instructions were reviewed.  Patient left the clinic in stable condition.

## 2022-03-11 ENCOUNTER — Telehealth: Payer: Self-pay | Admitting: *Deleted

## 2022-03-11 NOTE — Patient Outreach (Signed)
Care Coordination ? ?03/11/2022 ? ?[name] ?10/29/1961 ?[id number] ? ? ?Medicaid Managed Care  ? ?Unsuccessful Outreach Note ? ?03/11/2022 ?Name: Garrett Clark MRN: [id number] DOB: 07/01/1961 ? ?An unsuccessful telephone outreach to offer case management/care coordination services was attempted today.  ? ?Plan: A HIPPA compliant phone message was left for the patient providing contact information and requesting a return call.  ? ?Lurena Joiner RN, BSN ?[name] ?RN Care Coordinator ? ? ?

## 2022-03-12 ENCOUNTER — Ambulatory Visit (INDEPENDENT_AMBULATORY_CARE_PROVIDER_SITE_OTHER): Payer: Medicaid Other | Admitting: Primary Care

## 2022-03-12 ENCOUNTER — Encounter (INDEPENDENT_AMBULATORY_CARE_PROVIDER_SITE_OTHER): Payer: Self-pay | Admitting: Primary Care

## 2022-03-12 ENCOUNTER — Other Ambulatory Visit: Payer: Self-pay

## 2022-03-12 VITALS — BP 116/79 | HR 107 | Temp 97.9°F | Ht 69.0 in | Wt 206.4 lb

## 2022-03-12 DIAGNOSIS — M79602 Pain in left arm: Secondary | ICD-10-CM | POA: Diagnosis not present

## 2022-03-12 DIAGNOSIS — R202 Paresthesia of skin: Secondary | ICD-10-CM

## 2022-03-12 DIAGNOSIS — I10 Essential (primary) hypertension: Secondary | ICD-10-CM

## 2022-03-12 DIAGNOSIS — Z8042 Family history of malignant neoplasm of prostate: Secondary | ICD-10-CM

## 2022-03-12 DIAGNOSIS — M79601 Pain in right arm: Secondary | ICD-10-CM

## 2022-03-12 MED ORDER — GABAPENTIN 300 MG PO CAPS: ORAL_CAPSULE | ORAL | 3 refills | Status: DC

## 2022-03-12 NOTE — Progress Notes (Signed)
?[place] ? ? ?[name] is a 61 y.o. male presents for hypertension evaluation, Denies shortness of breath, headaches, chest pain or lower extremity edema, sudden onset, vision changes, unilateral weakness, dizziness, paresthesias  ? ?Patient reports adherence with medications. ? ?Dietary habits include: eats everything no diet does not add salt  ?Exercise habits include:walking  ?Family / Social history: Mother CVA/MI, Father MI/CVA/Prostate Cancer  ? ? ?Past Medical History:  ?Diagnosis Date  ? Cancer [facility])   ? Left kidney  ? Hyperlipidemia   ? Internal hemorrhoids   ? Polycystic liver disease   ? Substance abuse ([facility])   ? ?Past Surgical History:  ?Procedure Laterality Date  ? COLONOSCOPY    ? HEMORRHOID BANDING    ? ROBOT ASSISTED LAPAROSCOPIC NEPHRECTOMY Left 12/07/2017  ? Procedure: XI ROBOTIC ASSISTED LAPAROSCOPIC NEPHRECTOMY;  Surgeon: Cleon Gustin, MD;  Location: [facility];  Service: Urology;  Laterality: Left;  ? ?No Known Allergies ?Current Outpatient Medications on File Prior to Visit  ?Medication Sig Dispense Refill  ? atorvastatin (LIPITOR) 80 MG tablet Take 1 tablet (80 mg total) by mouth daily. 90 tablet 3  ? cyclobenzaprine (FLEXERIL) 10 MG tablet TAKE 1 TABLET(10 MG) BY MOUTH THREE TIMES DAILY AS NEEDED FOR MUSCLE SPASMS (Patient taking differently: Take 10 mg by mouth 3 (three) times daily as needed for muscle spasms.) 90 tablet 2  ? fenofibrate (TRICOR) 48 MG tablet Take 1 tablet (48 mg total) by mouth daily. 90 tablet 1  ? gabapentin (NEURONTIN) 300 MG capsule Take 1 capsule '300mg'$  by mouth twice dailyTAKE 1 CAPSULE(300 MG) BY MOUTH TWICE DAILY (Patient taking differently: Take 300 mg by mouth 2 (two) times daily.) 180 capsule 3  ? valsartan-hydrochlorothiazide (DIOVAN-HCT) 160-25 MG tablet Take 1 tablet by mouth daily. 90 tablet 1  ? acetaminophen (TYLENOL) 500 MG tablet Take 1 tablet (500 mg total) by mouth every 6 (six) hours as needed. (Patient taking  differently: Take 500 mg by mouth every 6 (six) hours as needed for moderate pain or headache.) 30 tablet 0  ? omeprazole (PRILOSEC) 20 MG capsule Take 1 capsule (20 mg total) by mouth daily. 30 capsule 3  ? ?No current facility-administered medications on file prior to visit.  ? ?Social History  ? ?Socioeconomic History  ? Marital status: Single  ?  Spouse name: Not on file  ? Number of children: 2  ? Years of education: 27  ? Highest education level: Not on file  ?Occupational History  ?  Employer: [employer]  ?Tobacco Use  ? Smoking status: Former  ?  Types: Cigarettes  ?  Quit date: 12/03/2012  ?  Years since quitting: 9.2  ? Smokeless tobacco: Never  ?Vaping Use  ? Vaping Use: Never used  ?Substance and Sexual Activity  ? Alcohol use: Yes  ?  Comment: Occasional  ? Drug use: Yes  ?  Types: Marijuana  ?  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 Sexual activity: Yes  ?Other Topics Concern  ? Not on file  ?Social History Narrative  ? Lives with girlfriend and 81 year old daughter.  Has 2 children.  Works at [employer].  Education: high school.   ? ?Social Determinants of Health  ? ?Financial Resource Strain: Not on file  ?Food Insecurity: Not on file  ?Transportation Needs: Not on file  ?Physical Activity: Not on  file  ?Stress: Not on file  ?Social Connections: Not on file  ?Intimate Partner Violence: Not on file  ? ?Family History  ?Problem Relation Age of Onset  ? Diabetes Mother   ? Hypertension Mother   ? Alcohol abuse Mother   ? Diabetes Father   ? Kidney disease Father   ? Diabetes Sister   ? Stroke Sister   ? Diabetes Paternal Grandmother   ? Cancer Paternal Grandfather   ?     type unknown  ? Colon cancer Neg Hx   ? ?OBJECTIVE: ? ?Vitals:  ? 03/12/22 1348  ?BP: 116/79  ?Pulse: (!) 107  ?Temp: 97.9 ?F (36.6 ?C)  ?TempSrc: Oral  ?SpO2: 96%  ?Weight: 206 lb 6.4 oz (93.6 kg)  ?Height: '5\' 9"'$  (1.753 m)  ? ?Physical Exam ?Vitals  reviewed.  ?Constitutional:   ?   Appearance: He is obese.  ?HENT:  ?   Head: Normocephalic.  ?   Right Ear: Tympanic membrane and external ear normal.  ?   Left Ear: Tympanic membrane and external ear normal.  ?   Nose: Nose normal.  ?Eyes:  ?   Extraocular Movements: Extraocular movements intact.  ?   Pupils: Pupils are equal, round, and reactive to light.  ?Cardiovascular:  ?   Rate and Rhythm: Normal rate and regular rhythm.  ?Pulmonary:  ?   Effort: Pulmonary effort is normal.  ?   Breath sounds: Normal breath sounds.  ?Abdominal:  ?   General: Bowel sounds are normal. There is distension.  ?   Palpations: Abdomen is soft.  ?Musculoskeletal:     ?   General: Normal range of motion.  ?   Cervical back: Normal range of motion and neck supple.  ?Skin: ?   General: Skin is warm and dry.  ?Neurological:  ?   Mental Status: He is alert and oriented to person, place, and time.  ?Psychiatric:     ?   Mood and Affect: Mood normal.     ?   Behavior: Behavior normal.     ?   Thought Content: Thought content normal.     ?   Judgment: Judgment normal.  ? ? ? ?ROS ?Comprehensive ROS Pertinent positive and negative noted in HPI  ?Last 3 Office BP readings: ?BP Readings from Last 3 Encounters:  ?03/12/22 116/79  ?01/27/22 120/89  ?01/21/22 (!) 151/95  ? ? ?BMET ?   ?Component Value Date/Time  ? NA 136 11/15/2021 0518  ? NA 141 07/29/2021 1410  ? K 4.8 11/15/2021 0518  ? CL 102 11/15/2021 0518  ? CO2 26 11/15/2021 0518  ? GLUCOSE 113 (H) 11/15/2021 0518  ? BUN 20 11/15/2021 0518  ? BUN 16 07/29/2021 1410  ? CREATININE 2.16 (H) 11/15/2021 0518  ? CREATININE 1.65 (H) 01/30/2020 1410  ? CALCIUM 9.2 11/15/2021 0518  ? GFRNONAA 35 (L) 11/15/2021 0518  ? GFRAA 50 (L) 08/23/2020 1213  ? ? ?Renal function: ?CrCl cannot be calculated (Patient's most recent lab result is older than the maximum 21 days allowed.). ? ?Clinical ASCVD: Yes  ?The 10-year ASCVD risk score (Arnett DK, et al., 2019) is: 21.9% ?  Values used to calculate the  score: ?    Age: 12 years ?    Sex: Male ?    Is Non-Hispanic African American: Yes ?    Diabetic: No ?    Tobacco smoker: Yes ?    Systolic Blood Pressure: 828 mmHg ?    Is  BP treated: Yes ?    HDL Cholesterol: 32 mg/dL ?    Total Cholesterol: 266 mg/dL ? ?ASCVD risk factors include- [place] ? ? ?ASSESSMENT & PLAN: ?Slade was seen today for blood pressure check. ? ?Diagnoses and all orders for this visit: ? ?Family history of prostate cancer ?-     PSA; Future ?-     PSA ? ?Paresthesia and pain of both upper extremities ?-     gabapentin (NEURONTIN) 300 MG capsule; Take 1 capsule '300mg'$  by mouth twice dailyTAKE 1 CAPSULE(300 MG) BY MOUTH TWICE DAILY ? ?Essential hypertension ?-Counseled on lifestyle modifications for blood pressure control including reduced dietary sodium, increased exercise, weight reduction and adequate sleep. Also, educated patient about the risk for cardiovascular events, stroke and heart attack. Also counseled patient about the importance of medication adherence. If you participate in smoking, it is important to stop using tobacco as this will increase the risks associated with uncontrolled blood pressure.  ? ?-Hypertension longstanding diagnosed currently Valsartan-hydrochlorothiazide (DIOVAN-HCT) 160-25 MG daily on current medications. Patient is adherent with current medications.  ? ?Goal BP:  ?For patients younger than 60: Goal BP < 130/80. ?For patients 60 and older: Goal BP < 140/90. ?For patients with diabetes: Goal BP < 130/80. ?Your most recent BP: 116/79 ? ?Minimize salt intake. ?Minimize alcohol intake ? ? ? ?This note has been created with Surveyor, quantity. Any transcriptional errors are unintentional.  ? ?Kerin Perna, NP ?03/12/2022, 1:58 PM ?  ?

## 2022-03-12 NOTE — Patient Instructions (Signed)
Zoster Vaccine, Recombinant injection ?What is this medication? ?ZOSTER VACCINE (ZOS ter vak SEEN) is a vaccine used to reduce the risk of getting shingles. This vaccine is not used to treat shingles or nerve pain from shingles. ?This medicine may be used for other purposes; ask your health care provider or pharmacist if you have questions. ?COMMON BRAND NAME(S): SHINGRIX ?What should I tell my care team before I take this medication? ?They need to know if you have any of these conditions: ?cancer ?immune system problems ?an unusual or allergic reaction to Zoster vaccine, other medications, foods, dyes, or preservatives ?pregnant or trying to get pregnant ?breast-feeding ?How should I use this medication? ?This vaccine is injected into a muscle. It is given by a health care provider. ?A copy of Vaccine Information Statements will be given before each vaccination. Be sure to read this information carefully each time. This sheet may change often. ?Talk to your health care provider about the use of this vaccine in children. This vaccine is not approved for use in children. ?Overdosage: If you think you have taken too much of this medicine contact a poison control center or emergency room at once. ?NOTE: This medicine is only for you. Do not share this medicine with others. ?What if I miss a dose? ?Keep appointments for follow-up (booster) doses. It is important not to miss your dose. Call your health care provider if you are unable to keep an appointment. ?What may interact with this medication? ?medicines that suppress your immune system ?medicines to treat cancer ?steroid medicines like prednisone or cortisone ?This list may not describe all possible interactions. Give your health care provider a list of all the medicines, herbs, non-prescription drugs, or dietary supplements you use. Also tell them if you smoke, drink alcohol, or use illegal drugs. Some items may interact with your medicine. ?What should I watch for  while using this medication? ?Visit your health care provider regularly. ?This vaccine, like all vaccines, may not fully protect everyone. ?What side effects may I notice from receiving this medication? ?Side effects that you should report to your doctor or health care professional as soon as possible: ?allergic reactions (skin rash, itching or hives; swelling of the face, lips, or tongue) ?trouble breathing ?Side effects that usually do not require medical attention (report these to your doctor or health care professional if they continue or are bothersome): ?chills ?headache ?fever ?nausea ?pain, redness, or irritation at site where injected ?tiredness ?vomiting ?This list may not describe all possible side effects. Call your doctor for medical advice about side effects. You may report side effects to FDA at 1-800-FDA-1088. ?Where should I keep my medication? ?This vaccine is only given by a health care provider. It will not be stored at home. ?NOTE: This sheet is a summary. It may not cover all possible information. If you have questions about this medicine, talk to your doctor, pharmacist, or health care provider. ?? 2022 Elsevier/Gold Standard (2021-08-27 00:00:00) ?Recombinant Zoster (Shingles) Vaccine: What You Need to Know ?1. Why get vaccinated? ?Recombinant zoster (shingles) vaccine can prevent shingles. ?Shingles (also called herpes zoster, or just zoster) is a painful skin rash, usually with blisters. In addition to the rash, shingles can cause fever, headache, chills, or upset stomach. Rarely, shingles can lead to complications such as pneumonia, hearing problems, blindness, brain inflammation (encephalitis), or death. ?The risk of shingles increases with age. The most common complication of shingles is long-term nerve pain called postherpetic neuralgia (PHN). PHN occurs in the  areas where the shingles rash was and can last for months or years after the rash goes away. The pain from PHN can be severe  and debilitating. ?The risk of PHN increases with age. An older adult with shingles is more likely to develop PHN and have longer lasting and more severe pain than a younger person. ?People with weakened immune systems also have a higher risk of getting shingles and complications from the disease. ?Shingles is caused by varicella-zoster virus, the same virus that causes chickenpox. After you have chickenpox, the virus stays in your body and can cause shingles later in life. Shingles cannot be passed from one person to another, but the virus that causes shingles can spread and cause chickenpox in someone who has never had chickenpox or has never received chickenpox vaccine. ?2. Recombinant shingles vaccine ?Recombinant shingles vaccine provides strong protection against shingles. By preventing shingles, recombinant shingles vaccine also protects against PHN and other complications. ?Recombinant shingles vaccine is recommended for: ?Adults 50 years and older ?Adults 19 years and older who have a weakened immune system because of disease or treatments ?Shingles vaccine is given as a two-dose series. For most people, the second dose should be given 2 to 6 months after the first dose. Some people who have or will have a weakened immune system can get the second dose 1 to 2 months after the first dose. Ask your health care provider for guidance. ?People who have had shingles in the past and people who have received varicella (chickenpox) vaccine are recommended to get recombinant shingles vaccine. The vaccine is also recommended for people who have already gotten another type of shingles vaccine, the live shingles vaccine. There is no live virus in recombinant shingles vaccine. ?Shingles vaccine may be given at the same time as other vaccines. ?3. Talk with your health care provider ?Tell your vaccination provider if the person getting the vaccine: ?Has had an allergic reaction after a previous dose of recombinant  shingles vaccine, or has any severe, life-threatening allergies ?Is currently experiencing an episode of shingles ?Is pregnant ?In some cases, your health care provider may decide to postpone shingles vaccination until a future visit. ?People with minor illnesses, such as a cold, may be vaccinated. People who are moderately or severely ill should usually wait until they recover before getting recombinant shingles vaccine. ?Your health care provider can give you more information. ?4. Risks of a vaccine reaction ?A sore arm with mild or moderate pain is very common after recombinant shingles vaccine. Redness and swelling can also happen at the site of the injection. ?Tiredness, muscle pain, headache, shivering, fever, stomach pain, and nausea are common after recombinant shingles vaccine. ?These side effects may temporarily prevent a vaccinated person from doing regular activities. Symptoms usually go away on their own in 2 to 3 days. You should still get the second dose of recombinant shingles vaccine even if you had one of these reactions after the first dose. ?Guillain-Barr? syndrome (GBS), a serious nervous system disorder, has been reported very rarely after recombinant zoster vaccine. ?People sometimes faint after medical procedures, including vaccination. Tell your provider if you feel dizzy or have vision changes or ringing in the ears. ?As with any medicine, there is a very remote chance of a vaccine causing a severe allergic reaction, other serious injury, or death. ?5. What if there is a serious problem? ?An allergic reaction could occur after the vaccinated person leaves the clinic. If you see signs of a severe allergic  reaction (hives, swelling of the face and throat, difficulty breathing, a fast heartbeat, dizziness, or weakness), call 9-1-1 and get the person to the nearest hospital. ?For other signs that concern you, call your health care provider. ?Adverse reactions should be reported to the Vaccine  Adverse Event Reporting System (VAERS). Your health care provider will usually file this report, or you can do it yourself. Visit the VAERS website at www.vaers.SamedayNews.es or call 212-086-0064. VAERS is only fo

## 2022-03-14 ENCOUNTER — Telehealth: Payer: Self-pay | Admitting: *Deleted

## 2022-03-14 DIAGNOSIS — Z8042 Family history of malignant neoplasm of prostate: Secondary | ICD-10-CM | POA: Diagnosis not present

## 2022-03-14 NOTE — Patient Outreach (Signed)
Care Coordination ? ?03/14/2022 ? ?[name] ?December 16, 1961 ?[id number] ? ? ?Medicaid Managed Care  ? ?Unsuccessful Outreach Note ? ?03/14/2022 ?Name: Garrett Clark MRN: [id number] DOB: [birth year] ? ?A second unsuccessful telephone outreach to offer case management/care coordination services was attempted today.  ? ?Plan: The care management team will reach out to the patient again over the next 7 days.  ? ?Lurena Joiner RN, BSN ?[place] ?RN Care Coordinator ? ? ?

## 2022-03-15 LAB — PSA: Prostate Specific Ag, Serum: 0.7 ng/mL (ref 0.0–4.0)

## 2022-03-18 ENCOUNTER — Telehealth (INDEPENDENT_AMBULATORY_CARE_PROVIDER_SITE_OTHER): Payer: Self-pay

## 2022-03-18 NOTE — Telephone Encounter (Signed)
Patient is aware of normal PSA. Garrett Clark, CMA  ?

## 2022-03-18 NOTE — Telephone Encounter (Signed)
-----   Message from Kerin Perna, NP sent at 03/16/2022 11:44 PM EDT ----- ?PSA is normal ?

## 2022-05-07 ENCOUNTER — Other Ambulatory Visit: Payer: Self-pay | Admitting: Urology

## 2022-05-07 DIAGNOSIS — M5441 Lumbago with sciatica, right side: Secondary | ICD-10-CM

## 2022-06-12 ENCOUNTER — Ambulatory Visit (INDEPENDENT_AMBULATORY_CARE_PROVIDER_SITE_OTHER): Payer: Medicaid Other | Admitting: Primary Care

## 2022-06-12 ENCOUNTER — Encounter (INDEPENDENT_AMBULATORY_CARE_PROVIDER_SITE_OTHER): Payer: Self-pay | Admitting: Primary Care

## 2022-06-12 VITALS — BP 104/74 | HR 89 | Temp 98.2°F | Ht 69.0 in | Wt 195.4 lb

## 2022-06-12 DIAGNOSIS — I10 Essential (primary) hypertension: Secondary | ICD-10-CM

## 2022-06-12 DIAGNOSIS — Z23 Encounter for immunization: Secondary | ICD-10-CM | POA: Diagnosis not present

## 2022-06-12 DIAGNOSIS — E7800 Pure hypercholesterolemia, unspecified: Secondary | ICD-10-CM

## 2022-06-12 MED ORDER — VALSARTAN-HYDROCHLOROTHIAZIDE 80-12.5 MG PO TABS: 1.0000 | ORAL_TABLET | Freq: Every day | ORAL | 3 refills | Status: DC

## 2022-06-12 MED ORDER — VALSARTAN-HYDROCHLOROTHIAZIDE 160-25 MG PO TABS: 1.0000 | ORAL_TABLET | Freq: Every day | ORAL | 1 refills | Status: DC

## 2022-06-12 NOTE — Progress Notes (Signed)
[facility]  Keir Traughber, is a 61 y.o. male  [id number]  [id number]  DOB - Jul 11, 1961  Chief Complaint  Patient presents with   Hyperlipidemia       Subjective:   Garrett Clark is a 61 y.o. male here today for a follow up visit. Patient has No headache, No chest pain, No abdominal pain - No Nausea, No new weakness tingling or numbness, No Cough - shortness of breath  No problems updated.  No Known Allergies  Past Medical History:  Diagnosis Date   Cancer (HCC)    Left kidney   Hyperlipidemia    Internal hemorrhoids    Polycystic liver disease    Substance abuse (HCC)     Current Outpatient Medications on File Prior to Visit  Medication Sig Dispense Refill   acetaminophen (TYLENOL) 500 MG tablet Take 1 tablet (500 mg total) by mouth every 6 (six) hours as needed. (Patient taking differently: Take 500 mg by mouth every 6 (six) hours as needed for moderate pain or headache.) 30 tablet 0   atorvastatin (LIPITOR) 80 MG tablet Take 1 tablet (80 mg total) by mouth daily. 90 tablet 3   cyclobenzaprine (FLEXERIL) 10 MG tablet Take 1 tablet (10 mg total) by mouth 3 (three) times daily as needed for muscle spasms. 90 tablet 1   fenofibrate (TRICOR) 48 MG tablet Take 1 tablet (48 mg total) by mouth daily. 90 tablet 1   gabapentin (NEURONTIN) 300 MG capsule Take 1 capsule 300mg  by mouth twice dailyTAKE 1 CAPSULE(300 MG) BY MOUTH TWICE DAILY 180 capsule 3   omeprazole (PRILOSEC) 20 MG capsule Take 1 capsule (20 mg total) by mouth daily. 30 capsule 3   No current facility-administered medications on file prior to visit.    Objective:   Vitals:   06/12/22 1339  BP: 104/74  Pulse: 89  Temp: 98.2 F (36.8 C)  TempSrc: Oral  SpO2: 96%  Weight: 195 lb 6.4 oz (88.6 kg)  Height: 5\' 9"  (1.753 m)    Exam General appearance : Awake, alert, not in any distress. Speech Clear. Not toxic looking HEENT: Atraumatic and Normocephalic, pupils equally reactive to  light and accomodation Neck: Supple, no JVD. No cervical lymphadenopathy.  Chest: Good air entry bilaterally, no added sounds  CVS: S1 S2 regular, no murmurs.  Abdomen: Bowel sounds present, Non tender and not distended with no gaurding, rigidity or rebound. Extremities: B/L Lower Ext shows no edema, both legs are warm to touch Neurology: Awake alert, and oriented X 3, CN II-XII intact, Non focal Skin: No Rash  Data Review No results found for: "HGBA1C"  Assessment & Plan  Garrett Clark was seen today for hyperlipidemia.  Diagnoses and all orders for this visit:  Need for shingles vaccine -     Varicella-zoster vaccine IM (Shingrix)  Pure hypercholesterolemia -     Lipid Panel  Essential hypertension BP 104/74 will Discontinue: valsartan-hydrochlorothiazide (DIOVAN-HCT) 160-25 MG tablet; Take 1 tablet by mouth daily. Change dose and re-evaluate 4-6 wks   Other orders -     valsartan-hydrochlorothiazide (DIOVAN-HCT) 80-12.5 MG tablet; Take 1 tablet by mouth daily.    Patient have been counseled extensively about nutrition and exercise. Other issues discussed during this visit include: low cholesterol diet, weight control and daily exercise, foot care, annual eye examinations at Ophthalmology, importance of adherence with medications and regular follow-up. We also discussed long term complications of uncontrolled diabetes and hypertension.   Return in about 4 weeks (around 07/10/2022) for  4-6 weeks Bp ck .  The patient was given clear instructions to go to ER or return to medical center if symptoms don't improve, worsen or new problems develop. The patient verbalized understanding. The patient was told to call to get lab results if they haven't heard anything in the next week.   This note has been created with Education officer, environmental. Any transcriptional errors are unintentional.   Grayce Sessions, NP 06/15/2022, 10:30 PM

## 2022-06-13 LAB — LIPID PANEL
Chol/HDL Ratio: 5.1 ratio — ABNORMAL HIGH (ref 0.0–5.0)
Cholesterol, Total: 164 mg/dL (ref 100–199)
HDL: 32 mg/dL — ABNORMAL LOW (ref >39)
LDL Chol Calc (NIH): 104 mg/dL — ABNORMAL HIGH (ref 0–99)
Triglycerides: 159 mg/dL — ABNORMAL HIGH (ref 0–149)
VLDL Cholesterol Cal: 28 mg/dL (ref 5–40)

## 2022-06-17 ENCOUNTER — Other Ambulatory Visit (INDEPENDENT_AMBULATORY_CARE_PROVIDER_SITE_OTHER): Payer: Self-pay | Admitting: Primary Care

## 2022-06-17 MED ORDER — ATORVASTATIN CALCIUM 40 MG PO TABS: 40.0000 mg | ORAL_TABLET | Freq: Every day | ORAL | 1 refills | Status: DC

## 2022-06-18 ENCOUNTER — Other Ambulatory Visit (INDEPENDENT_AMBULATORY_CARE_PROVIDER_SITE_OTHER): Payer: Self-pay | Admitting: Primary Care

## 2022-06-18 ENCOUNTER — Telehealth (INDEPENDENT_AMBULATORY_CARE_PROVIDER_SITE_OTHER): Payer: Self-pay

## 2022-06-18 DIAGNOSIS — E782 Mixed hyperlipidemia: Secondary | ICD-10-CM

## 2022-06-18 MED ORDER — FENOFIBRATE 48 MG PO TABS: 48.0000 mg | ORAL_TABLET | Freq: Every day | ORAL | 1 refills | Status: DC

## 2022-06-18 NOTE — Telephone Encounter (Signed)
-----   Message from Kerin Perna, NP sent at 06/18/2022  1:21 PM EDT ----- Cholesterol is improving continue Tricor '48mg'$  and atorvastatin both daily/nightly.  Healthy lifestyle diet of fruits vegetables fish nuts whole grains and low saturated fat . Foods high in cholesterol or liver, fatty meats,cheese, butter avocados, nuts and seeds, chocolate and fried foods.

## 2022-06-18 NOTE — Telephone Encounter (Signed)
Patient aware that cholesterol is improving but to continue taking cholesterol medications. Verbalized understanding. [name], CMA

## 2022-07-16 ENCOUNTER — Encounter (INDEPENDENT_AMBULATORY_CARE_PROVIDER_SITE_OTHER): Payer: Self-pay | Admitting: Primary Care

## 2022-07-16 ENCOUNTER — Ambulatory Visit (INDEPENDENT_AMBULATORY_CARE_PROVIDER_SITE_OTHER): Payer: Medicaid Other | Admitting: Primary Care

## 2022-07-16 VITALS — BP 109/74 | HR 87 | Temp 97.8°F | Ht 69.0 in | Wt 198.6 lb

## 2022-07-16 DIAGNOSIS — Z0130 Encounter for examination of blood pressure without abnormal findings: Secondary | ICD-10-CM | POA: Diagnosis not present

## 2022-07-16 NOTE — Progress Notes (Signed)
Garrett Clark, is a 61 y.o. male  [id number]  [id number]  DOB - 05/01/1961  Chief Complaint  Patient presents with   Blood Pressure Check       Subjective:   Garrett Clark is a 61 y.o. male here today for a follow up visit on previous visit Bp was low and change medication not much difference in BP today . Patient has No headache, No chest pain, No abdominal pain - No Nausea, No new weakness tingling or numbness, No Cough - shortness of breath  No problems updated.  No Known Allergies  Past Medical History:  Diagnosis Date   Cancer ([place])    Left kidney   Hyperlipidemia    Internal hemorrhoids    Polycystic liver disease    Substance abuse ([place])     Current Outpatient Medications on File Prior to Visit  Medication Sig Dispense Refill   acetaminophen (TYLENOL) 500 MG tablet Take 1 tablet (500 mg total) by mouth every 6 (six) hours as needed. (Patient taking differently: Take 500 mg by mouth every 6 (six) hours as needed for moderate pain or headache.) 30 tablet 0   atorvastatin (LIPITOR) 40 MG tablet Take 1 tablet (40 mg total) by mouth daily. 90 tablet 1   cyclobenzaprine (FLEXERIL) 10 MG tablet Take 1 tablet (10 mg total) by mouth 3 (three) times daily as needed for muscle spasms. 90 tablet 1   fenofibrate (TRICOR) 48 MG tablet Take 1 tablet (48 mg total) by mouth daily. 90 tablet 1   gabapentin (NEURONTIN) 300 MG capsule Take 1 capsule '300mg'$  by mouth twice dailyTAKE 1 CAPSULE(300 MG) BY MOUTH TWICE DAILY 180 capsule 3   omeprazole (PRILOSEC) 20 MG capsule Take 1 capsule (20 mg total) by mouth daily. 30 capsule 3   valsartan-hydrochlorothiazide (DIOVAN-HCT) 80-12.5 MG tablet Take 1 tablet by mouth daily. 90 tablet 3   No current facility-administered medications on file prior to visit.   Comprehensive ROS Pertinent positive and negative noted in HPI   Objective:   Vitals:   07/16/22 1346  BP: 109/74  Pulse: 87  Temp: 97.8 F  (36.6 C)  TempSrc: Oral  SpO2: 96%  Weight: 198 lb 9.6 oz (90.1 kg)  Height: '5\' 9"'$  (1.753 m)    Exam General appearance : Awake, alert, not in any distress. Speech Clear. Not toxic looking HEENT: Atraumatic and Normocephalic, pupils equally reactive to light and accomodation Neck: Supple, no JVD. No cervical lymphadenopathy.  Chest: Good air entry bilaterally, no added sounds  CVS: S1 S2 regular, no murmurs.  Abdomen: Bowel sounds present, Non tender and not distended with no gaurding, rigidity or rebound. Extremities: B/L Lower Ext shows no edema, both legs are warm to touch Neurology: Awake alert, and oriented X 3,  Non focal Skin: No Rash  Data Review No results found for: "HGBA1C"  Assessment & Plan  Garrett Clark was seen today for blood pressure check.  Diagnoses and all orders for this visit:  Blood pressure check Blood pressure remains below 120/80 continue to follow low-sodium, DASH diet, medication compliance, 150 minutes of moderate intensity exercise per week. Discussed medication compliance, adverse effects. Stay hydrated.     Patient have been counseled extensively about nutrition and exercise. Other issues discussed during this visit include: low cholesterol diet, weight control and daily exercise, foot care, annual eye examinations at Ophthalmology, importance of adherence with medications and regular follow-up. We also discussed long term complications of uncontrolled diabetes and hypertension.  No follow-ups on file.  The patient was given clear instructions to go to ER or return to medical center if symptoms don't improve, worsen or new problems develop. The patient verbalized understanding. The patient was told to call to get lab results if they haven't heard anything in the next week.   This note has been created with Surveyor, quantity. Any transcriptional errors are unintentional.   Kerin Perna,  NP 07/16/2022, 2:06 PM

## 2022-08-12 ENCOUNTER — Telehealth: Payer: Self-pay | Admitting: Primary Care

## 2022-08-12 NOTE — Telephone Encounter (Signed)
..  Patient declines further follow up and engagement by the Managed Medicaid Team. Appropriate care team members and provider have been notified via electronic communication. The Managed Medicaid Team is available to follow up with the patient after provider conversation with the patient regarding recommendation for engagement and subsequent re-referral to the Managed Medicaid Team.    Jennifer Alley Care Guide, High Risk Medicaid Managed Care Embedded Care Coordination   [facility]   

## 2022-08-21 ENCOUNTER — Other Ambulatory Visit: Payer: Medicaid Other

## 2022-08-21 ENCOUNTER — Other Ambulatory Visit: Payer: Self-pay | Admitting: Urology

## 2022-08-21 DIAGNOSIS — C642 Malignant neoplasm of left kidney, except renal pelvis: Secondary | ICD-10-CM | POA: Diagnosis not present

## 2022-08-22 LAB — COMPREHENSIVE METABOLIC PANEL
ALT: 26 IU/L (ref 0–44)
AST: 31 IU/L (ref 0–40)
Albumin/Globulin Ratio: 1.4 (ref 1.2–2.2)
Albumin: 4.6 g/dL (ref 3.8–4.9)
Alkaline Phosphatase: 105 IU/L (ref 44–121)
BUN/Creatinine Ratio: 7 — ABNORMAL LOW (ref 9–20)
BUN: 11 mg/dL (ref 6–24)
Bilirubin Total: 1 mg/dL (ref 0.0–1.2)
CO2: 23 mmol/L (ref 20–29)
Calcium: 9.4 mg/dL (ref 8.7–10.2)
Chloride: 102 mmol/L (ref 96–106)
Creatinine, Ser: 1.51 mg/dL — ABNORMAL HIGH (ref 0.76–1.27)
Globulin, Total: 3.3 g/dL (ref 1.5–4.5)
Glucose: 88 mg/dL (ref 70–99)
Potassium: 4.6 mmol/L (ref 3.5–5.2)
Sodium: 140 mmol/L (ref 134–144)
Total Protein: 7.9 g/dL (ref 6.0–8.5)
eGFR: 53 mL/min/{1.73_m2} — ABNORMAL LOW (ref >59)

## 2022-08-28 ENCOUNTER — Other Ambulatory Visit: Payer: Self-pay

## 2022-08-28 DIAGNOSIS — C642 Malignant neoplasm of left kidney, except renal pelvis: Secondary | ICD-10-CM

## 2022-08-29 ENCOUNTER — Ambulatory Visit (HOSPITAL_COMMUNITY)
Admission: RE | Admit: 2022-08-29 | Discharge: 2022-08-29 | Disposition: A | Discharge status: Home / Self Care | Payer: Medicaid Other | Source: Ambulatory Visit | Attending: Urology | Admitting: Urology

## 2022-08-29 ENCOUNTER — Ambulatory Visit (INDEPENDENT_AMBULATORY_CARE_PROVIDER_SITE_OTHER): Payer: Medicaid Other | Admitting: Urology

## 2022-08-29 ENCOUNTER — Encounter: Payer: Self-pay | Admitting: Urology

## 2022-08-29 VITALS — BP 112/76 | HR 88

## 2022-08-29 DIAGNOSIS — C642 Malignant neoplasm of left kidney, except renal pelvis: Secondary | ICD-10-CM | POA: Diagnosis present

## 2022-08-29 DIAGNOSIS — C649 Malignant neoplasm of unspecified kidney, except renal pelvis: Secondary | ICD-10-CM | POA: Diagnosis not present

## 2022-08-29 DIAGNOSIS — Z85528 Personal history of other malignant neoplasm of kidney: Secondary | ICD-10-CM | POA: Diagnosis not present

## 2022-08-29 LAB — URINALYSIS, ROUTINE W REFLEX MICROSCOPIC
Bilirubin, UA: NEGATIVE
Glucose, UA: NEGATIVE
Ketones, UA: NEGATIVE
Leukocytes,UA: NEGATIVE
Nitrite, UA: NEGATIVE
RBC, UA: NEGATIVE
Specific Gravity, UA: 1.025 (ref 1.005–1.030)
Urobilinogen, Ur: 1 mg/dL (ref 0.2–1.0)
pH, UA: 5.5 (ref 5.0–7.5)

## 2022-08-29 LAB — MICROSCOPIC EXAMINATION
Bacteria, UA: NONE SEEN
Epithelial Cells (non renal): NONE SEEN /hpf (ref 0–10)
RBC, Urine: NONE SEEN /hpf (ref 0–2)
Renal Epithel, UA: NONE SEEN /hpf
WBC, UA: NONE SEEN /hpf (ref 0–5)

## 2022-08-29 NOTE — Patient Instructions (Signed)
Kidney Cancer  Kidney cancer is an abnormal growth of cells in one or both kidneys. The kidneys filter waste from your blood and produce urine. Kidney cancer may spread to other parts of your body. This type of cancer may also be called renal cell carcinoma. What are the causes? The cause of this condition is not always known. In some cases, abnormal changes to genes (genetic mutations) can cause cells to form cancer. What increases the risk? You may be more likely to develop kidney cancer if you: Are over age 98. The risk increases with age. Have a family history of kidney cancer. Are of African-American, Native American, or Native [place] descent. Smoke. Are male. Are obese. Have high blood pressure (hypertension). Have advanced kidney disease, especially if you need long-term dialysis. Have certain conditions that are passed from parent to child (inherited), such as von Hippel-Lindau disease, tuberous sclerosis, or hereditary papillary renal carcinoma. Have been exposed to certain chemicals. What are the signs or symptoms? In the early stages, kidney cancer does not cause symptoms. As the cancer grows, symptoms may include: Blood in the urine. Pain in the upper back or abdomen, just below the rib cage. You may feel pain on one or both sides of the body. Fatigue. Unexplained weight loss. Fever. How is this diagnosed? This condition may be diagnosed based on: Your symptoms and medical history. A physical exam. Blood and urine tests. X-rays. Imaging tests, such as CT scans, MRIs, and PET scans. Having dye injected into your blood through an IV, and then having X-rays taken of: Your kidneys and the rest of the organs involved in making and storing urine (intravenous pyelogram). Your blood vessels (angiogram). Removal and testing of a kidney tissue sample (biopsy). Your cancer will be assessed (staged), based on how severe it is and how much it has spread. How is this  treated? Treatment depends on the type and stage of the cancer. Treatment may include one or more of the following: Surgery. This may include surgery to remove: Just the tumor (nephron-sparing surgery). The entire kidney (nephrectomy). The kidney, some of the surrounding healthy tissue, nearby lymph nodes, and the adrenal gland in certain cases (radical nephrectomy). Medicines that kill cancer cells (chemotherapy). High-energy rays that kill cancer cells (radiation therapy). Targeted therapy. This targets specific parts of cancer cells and the area around them to block the growth and the spread of the cancer. Targeted therapy can help to limit the damage to healthy cells. Medicines that help your body's disease-fighting system (immune system) fight cancer cells (immunotherapy). Freezing cancer cells using gas or liquid that is delivered through a needle (cryoablation). Destroying cancer cells using high-energy radio waves that are delivered through a needle-like probe (radiofrequency ablation). A procedure to block the artery that supplies blood to the tumor, which kills the cancer cells (embolization). Follow these instructions at home: Eating and drinking Some of your treatments might affect your appetite and your ability to chew and swallow. If you are having problems eating, or if you do not have an appetite, meet with a diet and nutrition specialist (dietitian). If you have side effects that affect eating, it may help to: Eat smaller meals and snacks often. Drink high-nutrition and high-calorie shakes or supplements. Eat bland and soft foods that are easy to eat. Not eat foods that are hot, spicy, or hard to swallow. Lifestyle Do not drink alcohol. Do not use any products that contain nicotine or tobacco, such as cigarettes and e-cigarettes. If you need  help quitting, ask your health care provider. General instructions  Take over-the-counter and prescription medicines only as told by  your health care provider. This includes vitamins, supplements, and herbal products. Consider joining a support group to help you cope with the stress of having kidney cancer. Work with your health care provider to manage any side effects of treatment. Keep all follow-up visits as told by your health care provider. This is important. Where to find more information American Cancer Society: https://www.cancer.[place] ([place]): https://www.cancer.gov Contact a health care provider if you: Notice that you bruise or bleed easily. Are losing weight without trying. Have new or increased fatigue or weakness. Get help right away if you have: Blood in your urine. A sudden increase in pain. A fever. Shortness of breath. Chest pain. Yellow skin or whites of your eyes (jaundice). Summary Kidney cancer is an abnormal growth of cells (tumor) in one or both kidneys. Tumors may spread to other parts of your body. In the early stages, kidney cancer does not cause symptoms. As the cancer grows, symptoms may include blood in the urine, pain in the upper back or abdomen, unexplained weight loss, fatigue, and fever. Treatment depends on the type and stage of the cancer. It may include surgery to remove the tumor, procedures and medicines to kill the cancer cells, or medicines to help your body fight cancer cells. This information is not intended to replace advice given to you by your health care provider. Make sure you discuss any questions you have with your health care provider. Document Revised: 02/25/2022 Document Reviewed: 07/30/2021 Elsevier Patient Education  [place].

## 2022-08-29 NOTE — Progress Notes (Signed)
08/29/2022 12:48 PM   Garrett Clark 1961-10-07 [id number]  Referring provider: Kerin Perna, NP [address] [place],  [place] [address]  Followup RCC   HPI: Mr Garrett Clark is a 61yo here for followup for left RCC. He underwent left radical nephrectomy 11/2017. Creatinine 1.53. Chest xray normal. No other complaints today   PMH: Past Medical History:  Diagnosis Date   Cancer ([place])    Left kidney   Hyperlipidemia    Internal hemorrhoids    Polycystic liver disease    Substance abuse ([place])     Surgical History: Past Surgical History:  Procedure Laterality Date   COLONOSCOPY     HEMORRHOID BANDING     ROBOT ASSISTED LAPAROSCOPIC NEPHRECTOMY Left 12/07/2017   Procedure: XI ROBOTIC ASSISTED LAPAROSCOPIC NEPHRECTOMY;  Surgeon: Cleon Gustin, MD;  Location: [facility];  Service: Urology;  Laterality: Left;    Home Medications:  Allergies as of 08/29/2022   No Known Allergies      Medication List        Accurate as of August 29, 2022 12:48 PM. If you have any questions, ask your nurse or doctor.          acetaminophen 500 MG tablet Commonly known as: TYLENOL Take 1 tablet (500 mg total) by mouth every 6 (six) hours as needed. What changed: reasons to take this   atorvastatin 40 MG tablet Commonly known as: LIPITOR Take 1 tablet (40 mg total) by mouth daily.   cyclobenzaprine 10 MG tablet Commonly known as: FLEXERIL Take 1 tablet (10 mg total) by mouth 3 (three) times daily as needed for muscle spasms.   fenofibrate 48 MG tablet Commonly known as: Tricor Take 1 tablet (48 mg total) by mouth daily.   gabapentin 300 MG capsule Commonly known as: NEURONTIN Take 1 capsule '300mg'$  by mouth twice dailyTAKE 1 CAPSULE(300 MG) BY MOUTH TWICE DAILY   omeprazole 20 MG capsule Commonly known as: PRILOSEC Take 1 capsule (20 mg total) by mouth daily.   valsartan-hydrochlorothiazide 80-12.5 MG tablet Commonly known as: DIOVAN-HCT Take 1 tablet by mouth  daily.        Allergies: No Known Allergies  Family History: Family History  Problem Relation Age of Onset   Diabetes Mother    Hypertension Mother    Alcohol abuse Mother    Diabetes Father    Kidney disease Father    Diabetes Sister    Stroke Sister    Diabetes Paternal Grandmother    Cancer Paternal Grandfather        type unknown   Colon cancer Neg Hx     Social History:  reports that he quit smoking about 9 years ago. His smoking use included cigarettes. He has never used smokeless tobacco. He reports current alcohol use. He reports current drug use. Drug: Marijuana.  ROS: All other review of systems were reviewed and are negative except what is noted above in HPI  Physical Exam: BP 112/76   Pulse 88   Constitutional:  Alert and oriented, No acute distress. HEENT: [place] AT, moist mucus membranes.  Trachea midline, no masses. Cardiovascular: No clubbing, cyanosis, or edema. Respiratory: Normal respiratory effort, no increased work of breathing. GI: Abdomen is soft, nontender, nondistended, no abdominal masses GU: No CVA tenderness.  Lymph: No cervical or inguinal lymphadenopathy. Skin: No rashes, bruises or suspicious lesions. Neurologic: Grossly intact, no focal deficits, moving all 4 extremities. Psychiatric: Normal mood and affect.  Laboratory Data: Lab Results  Component Value Date  WBC 4.9 11/15/2021   HGB 16.1 11/15/2021   HCT 49.3 11/15/2021   MCV 93.2 11/15/2021   PLT 292 11/15/2021    Lab Results  Component Value Date   CREATININE 1.51 (H) 08/21/2022    No results found for: "PSA"  No results found for: "TESTOSTERONE"  No results found for: "HGBA1C"  Urinalysis    Component Value Date/Time   COLORURINE YELLOW 11/15/2021 0518   APPEARANCEUR CLEAR 11/15/2021 0518   APPEARANCEUR Clear 08/29/2020 1647   LABSPEC 1.031 (H) 11/15/2021 0518   PHURINE 5.0 11/15/2021 0518   GLUCOSEU NEGATIVE 11/15/2021 0518   HGBUR NEGATIVE 11/15/2021 0518    BILIRUBINUR NEGATIVE 11/15/2021 0518   BILIRUBINUR Negative 08/29/2020 [place] 11/15/2021 0518   PROTEINUR 100 (A) 11/15/2021 0518   UROBILINOGEN 1.0 03/07/2020 1204   NITRITE NEGATIVE 11/15/2021 0518   LEUKOCYTESUR NEGATIVE 11/15/2021 0518    Lab Results  Component Value Date   LABMICR See below: 08/29/2020   WBCUA None seen 08/29/2020   LABEPIT None seen 08/29/2020   BACTERIA NONE SEEN 11/15/2021    Pertinent Imaging: Chest xray today: Images reviewed and discussed with the patient  No results found for this or any previous visit.  No results found for this or any previous visit.  No results found for this or any previous visit.  No results found for this or any previous visit.  No results found for this or any previous visit.  No results found for this or any previous visit.  No results found for this or any previous visit.  Results for orders placed during the hospital encounter of 11/15/21  CT Renal Stone Study  Narrative CLINICAL DATA:  Right flank pain  EXAM: CT ABDOMEN AND PELVIS WITHOUT CONTRAST  TECHNIQUE: Multidetector CT imaging of the abdomen and pelvis was performed following the standard protocol without IV contrast.  COMPARISON:  11/09/2019  FINDINGS: Lower chest: No pleural or pericardial effusion. Visualized lung bases clear.  Hepatobiliary: Multiple small hepatic cysts stable. No new lesion or biliary ductal dilatation. Gallbladder physiologically distended with no calcified gallstones.  Pancreas: Unremarkable. No pancreatic ductal dilatation or surrounding inflammatory changes.  Spleen: Normal in size without focal abnormality.  Adrenals/Urinary Tract: Adrenal glands unremarkable. Post left nephrectomy. Right kidney unremarkable. Urinary bladder incompletely distended.  Stomach/Bowel: Stomach is nondistended, unremarkable. Small bowel decompressed. Normal appendix. The colon is  nondilated, unremarkable.  Vascular/Lymphatic: No significant vascular findings are present. No enlarged abdominal or pelvic lymph nodes.  Reproductive: Mild prostate enlargement  Other: No ascites.  No free air.  Musculoskeletal: Bilateral hip DJD. No fracture or worrisome bone lesion.  IMPRESSION: 1. No acute findings.  No urolithiasis. 2. Stable changes post left nephrectomy without residual or recurrent disease.   Electronically Signed By: Lucrezia Europe M.D. On: 11/15/2021 08:10   Assessment & Plan:    1. Renal cell carcinoma of left kidney [facility]) Patient is 5 years after left radical nephrectomy. He can followup prn - Urinalysis, Routine w reflex microscopic   No follow-ups on file.  Nicolette Bang, MD  [facility]

## 2022-09-09 ENCOUNTER — Other Ambulatory Visit: Payer: Self-pay | Admitting: Urology

## 2022-09-09 DIAGNOSIS — M5441 Lumbago with sciatica, right side: Secondary | ICD-10-CM

## 2022-11-24 ENCOUNTER — Other Ambulatory Visit (INDEPENDENT_AMBULATORY_CARE_PROVIDER_SITE_OTHER): Payer: Self-pay | Admitting: Primary Care

## 2022-11-24 NOTE — Telephone Encounter (Signed)
Requested Prescriptions  Pending Prescriptions Disp Refills   atorvastatin (LIPITOR) 80 MG tablet [Pharmacy Med Name: ATORVASTATIN '80MG'$  TABLETS] 90 tablet 3    Sig: TAKE 1 TABLET(80 MG) BY MOUTH DAILY     Cardiovascular:  Antilipid - Statins Failed - 11/24/2022  6:40 AM      Failed - Lipid Panel in normal range within the last 12 months    Cholesterol, Total  Date Value Ref Range Status  06/12/2022 164 100 - 199 mg/dL Final   LDL Chol Calc (NIH)  Date Value Ref Range Status  06/12/2022 104 (H) 0 - 99 mg/dL Final   HDL  Date Value Ref Range Status  06/12/2022 32 (L) >39 mg/dL Final   Triglycerides  Date Value Ref Range Status  06/12/2022 159 (H) 0 - 149 mg/dL Final         Passed - Patient is not pregnant      Passed - Valid encounter within last 12 months    Recent Outpatient Visits           4 months ago Blood pressure check   [place] Kerin Perna, NP   5 months ago Need for shingles vaccine   [facility] Kerin Perna, NP   8 months ago Family history of prostate cancer   [place] Kerin Perna, NP   10 months ago Essential hypertension   [place], Michelle P, NP   11 months ago Pure hypercholesterolemia   [place] Kerin Perna, NP       Future Appointments             In 1 month Oletta Lamas, Milford Cage, NP [place]

## 2022-12-10 ENCOUNTER — Ambulatory Visit (INDEPENDENT_AMBULATORY_CARE_PROVIDER_SITE_OTHER): Payer: Medicaid Other | Admitting: Primary Care

## 2022-12-10 ENCOUNTER — Encounter (INDEPENDENT_AMBULATORY_CARE_PROVIDER_SITE_OTHER): Payer: Self-pay | Admitting: Primary Care

## 2022-12-10 VITALS — BP 161/92 | HR 56 | Ht 70.8 in | Wt 191.0 lb

## 2022-12-10 DIAGNOSIS — G8929 Other chronic pain: Secondary | ICD-10-CM

## 2022-12-10 DIAGNOSIS — M545 Low back pain, unspecified: Secondary | ICD-10-CM | POA: Diagnosis not present

## 2022-12-10 LAB — POCT URINALYSIS DIP (CLINITEK)
Bilirubin, UA: NEGATIVE
Blood, UA: NEGATIVE
Glucose, UA: NEGATIVE mg/dL
Ketones, POC UA: NEGATIVE mg/dL
Leukocytes, UA: NEGATIVE
Nitrite, UA: NEGATIVE
POC PROTEIN,UA: 30 — AB
Spec Grav, UA: 1.02 (ref 1.010–1.025)
Urobilinogen, UA: >=8 E.U./dL — AB
pH, UA: 7 (ref 5.0–8.0)

## 2022-12-10 NOTE — Progress Notes (Signed)
[place]  Garrett Clark, is a 61 y.o. male  TKP:[id number]  [id number]  DOB - May 24, 1961  Chief Complaint  Patient presents with   Leg Pain    Right leg pain. Extends from back of right knee up to buttocks.        Subjective:   Garrett Clark is a 61 y.o. male here today for a acute visit. C/o  low back pain that goes to but check and down his leg. He woke up with this pain 6 months ago and is getting worst felt worst when driving.  Note patient only has 1 kidney on the right side and is also followed by urology.  Patient has No headache, No chest pain, No abdominal pain - No Nausea, No new weakness tingling or numbness, No Cough - shortness of breath.  Patient chief complaint is right leg pain that radiates up from his back from right knee to buttocks.  No abnormal findings on exam  No problems updated.  No Known Allergies  Past Medical History:  Diagnosis Date   Cancer ([place])    Left kidney   Hyperlipidemia    Internal hemorrhoids    Polycystic liver disease    Substance abuse ([place])     Current Outpatient Medications on File Prior to Visit  Medication Sig Dispense Refill   acetaminophen (TYLENOL) 500 MG tablet Take 1 tablet (500 mg total) by mouth every 6 (six) hours as needed. (Patient taking differently: Take 500 mg by mouth every 6 (six) hours as needed for moderate pain or headache.) 30 tablet 0   atorvastatin (LIPITOR) 40 MG tablet Take 1 tablet (40 mg total) by mouth daily. 90 tablet 1   cyclobenzaprine (FLEXERIL) 10 MG tablet TAKE 1 TABLET(10 MG) BY MOUTH THREE TIMES DAILY AS NEEDED FOR MUSCLE SPASMS 90 tablet 1   fenofibrate (TRICOR) 48 MG tablet Take 1 tablet (48 mg total) by mouth daily. 90 tablet 1   gabapentin (NEURONTIN) 300 MG capsule Take 1 capsule '300mg'$  by mouth twice dailyTAKE 1 CAPSULE(300 MG) BY MOUTH TWICE DAILY 180 capsule 3   omeprazole (PRILOSEC) 20 MG capsule Take 1 capsule (20 mg total) by mouth daily. 30 capsule 3    valsartan-hydrochlorothiazide (DIOVAN-HCT) 80-12.5 MG tablet Take 1 tablet by mouth daily. 90 tablet 3   atorvastatin (LIPITOR) 80 MG tablet TAKE 1 TABLET(80 MG) BY MOUTH DAILY 90 tablet 1   No current facility-administered medications on file prior to visit.    Objective:   Vitals:   12/10/22 1414  BP: (Abnormal) 161/92  Pulse: (Abnormal) 56  SpO2: 100%  Weight: 191 lb (86.6 kg)  Height: 5' 10.8" (1.798 m)    Exam General appearance : Awake, alert, not in any distress. Speech Clear. Not toxic looking HEENT: Atraumatic and Normocephalic, pupils equally reactive to light and accomodation Neck: Supple, no JVD. No cervical lymphadenopathy.  Chest: Good air entry bilaterally, no added sounds  CVS: S1 S2 regular, no murmurs.  Abdomen: Bowel sounds present, Non tender and not distended with no gaurding, rigidity or rebound. Extremities: B/L Lower Ext shows no edema, both legs are warm to touch Neurology: Awake alert, and oriented X 3,  Non focal Skin: No Rash  Data Review No results found for: "HGBA1C"  Assessment & Plan  Garrett Clark was seen today for leg pain.  Diagnoses and all orders for this visit:  Chronic right-sided low back pain without sciatica Denies trauma from L4-S5 down right leg. Note this is the side that he  has a kidney on.  Urinalysis to rule out any other causative factors -     Ambulatory referral to Orthopedic Surgery  Note blood pressure is elevated did not take medication will need to return for nurse visit for BP check only There are no diagnoses linked to this encounter.   Patient have been counseled extensively about nutrition and exercise. Other issues discussed during this visit include: low cholesterol diet, weight control and daily exercise, foot care, annual eye examinations at Ophthalmology, importance of adherence with medications and regular follow-up. We also discussed long term complications of uncontrolled diabetes and hypertension.     The  patient was given clear instructions to go to ER or return to medical center if symptoms don't improve, worsen or new problems develop. The patient verbalized understanding. The patient was told to call to get lab results if they haven't heard anything in the next week.   This note has been created with Surveyor, quantity. Any transcriptional errors are unintentional.   Kerin Perna, NP 12/16/2022, 10:55 PM

## 2022-12-23 ENCOUNTER — Other Ambulatory Visit (INDEPENDENT_AMBULATORY_CARE_PROVIDER_SITE_OTHER): Payer: Self-pay | Admitting: Primary Care

## 2022-12-24 NOTE — Telephone Encounter (Signed)
Requested Prescriptions  Pending Prescriptions Disp Refills   atorvastatin (LIPITOR) 40 MG tablet [Pharmacy Med Name: ATORVASTATIN '40MG'$  TABLETS] 90 tablet 1    Sig: TAKE 1 TABLET(40 MG) BY MOUTH DAILY     Cardiovascular:  Antilipid - Statins Failed - 12/23/2022  5:13 PM      Failed - Lipid Panel in normal range within the last 12 months    Cholesterol, Total  Date Value Ref Range Status  06/12/2022 164 100 - 199 mg/dL Final   LDL Chol Calc (NIH)  Date Value Ref Range Status  06/12/2022 104 (H) 0 - 99 mg/dL Final   HDL  Date Value Ref Range Status  06/12/2022 32 (L) >39 mg/dL Final   Triglycerides  Date Value Ref Range Status  06/12/2022 159 (H) 0 - 149 mg/dL Final         Passed - Patient is not pregnant      Passed - Valid encounter within last 12 months    Recent Outpatient Visits           2 weeks ago Chronic right-sided low back pain without sciatica   [place] Kerin Perna, NP   5 months ago Blood pressure check   [place] [facility] Kerin Perna, NP   6 months ago Need for shingles vaccine   [facility] Kerin Perna, NP   9 months ago Family history of prostate cancer   [place] Kerin Perna, NP   11 months ago Essential hypertension   [place] Kerin Perna, NP       Future Appointments             Tomorrow Magnant, Gerrianne Scale, PA-C [place]   In 3 weeks Kerin Perna, NP [place]

## 2022-12-25 ENCOUNTER — Encounter: Payer: Self-pay | Admitting: Surgical

## 2022-12-25 ENCOUNTER — Ambulatory Visit (INDEPENDENT_AMBULATORY_CARE_PROVIDER_SITE_OTHER): Payer: Medicaid Other

## 2022-12-25 ENCOUNTER — Ambulatory Visit (INDEPENDENT_AMBULATORY_CARE_PROVIDER_SITE_OTHER): Payer: Medicaid Other | Admitting: Surgical

## 2022-12-25 DIAGNOSIS — M5116 Intervertebral disc disorders with radiculopathy, lumbar region: Secondary | ICD-10-CM

## 2022-12-25 DIAGNOSIS — M79604 Pain in right leg: Secondary | ICD-10-CM | POA: Diagnosis not present

## 2022-12-25 NOTE — Progress Notes (Signed)
Office Visit Note   Patient: Garrett Clark           Date of Birth: 03-30-1961           MRN: [id number] Visit Date: 12/25/2022 Requested by: Kerin Perna, NP [place],  [place] [address] PCP: Kerin Perna, NP  Subjective: Chief Complaint  Patient presents with   Lower Back - Pain    HPI: Garrett Clark is a 62 y.o. male who presents to the office reporting low back pain with right hip pain.  He has right hip pain in the buttock region that travels down the posterior aspect of the right thigh into the knee.  He also has occasional groin pain that is worse with walking.  He describes burning and tingling sensation in the posterior aspect of the thigh.  He has had prior difficulty with similar pain in this leg with MRI from January 2022 that demonstrated mild bilateral L3 foraminal narrowing as well as right foraminal disc protrusion at L4-L5 contacting the right L4 nerve root.  He has had prior ESI's with good relief though these only last for a month or 2.  Last ESI with Dr. Ernestina Patches was 01/27/2022.  States that the most recent bout of pain started several months ago and began as intermittent but now has progressed to fairly constant pain.  He takes Tylenol and gabapentin with some relief of pain but does not give a ton of relief.  He cannot take anti-inflammatories due to his history of radical left nephrectomy from renal cell carcinoma.  Still has right-sided kidney..                ROS: All systems reviewed are negative as they relate to the chief complaint within the history of present illness.  Patient denies fevers or chills.  Assessment & Plan: Visit Diagnoses:  1. Radiculopathy due to lumbar intervertebral disc disorder   2. Pain in right leg     Plan: Patient is a 62 year old male who presents for evaluation of right leg pain.  Has history of of L4-L5 right-sided foraminal disc protrusion which she has had ESI's for in the past that provided  good relief.  Most of his symptoms seem related to his back based on the radicular nature extending from the back into the buttock and down to the posterior right knee.  It is reproduced with straight leg raise and is associated with burning and tingling.  However, he does have some groin pain that he has not really had in the past that is reproducible with range of motion of the hip joint as well as positive Stinchfield sign today.  He has negative radiographs with no significant findings on hip x-rays today.  Plan to try diagnostic lumbar spine ESI with Dr. Ernestina Patches.  Follow-up in 5 weeks for clinical recheck so that we may reassess how his pain is doing after the ESI.  At that point, if he is having continued pain that was not improved with ESI, could consider either hip injection or MRI of the pelvis for further evaluation of occult hip arthritis.  Patient agreed with this plan.  If he has significant relief of all of his symptoms at that time but relief is short-lived, may refer to Dr. Laurance Flatten for consideration of surgical intervention  Follow-Up Instructions: No follow-ups on file.   Orders:  Orders Placed This Encounter  Procedures   XR HIP UNILAT W OR W/O PELVIS  2-3 VIEWS RIGHT   No orders of the defined types were placed in this encounter.     Procedures: No procedures performed   Clinical Data: No additional findings.  Objective: Vital Signs: There were no vitals taken for this visit.  Physical Exam:  Constitutional: Patient appears well-developed HEENT:  Head: Normocephalic Eyes:EOM are normal Neck: Normal range of motion Cardiovascular: Normal rate Pulmonary/chest: Effort normal Neurologic: Patient is alert Skin: Skin is warm Psychiatric: Patient has normal mood and affect  Ortho Exam: Ortho exam demonstrates intact hip flexion, quadricep, hamstring, dorsiflexion, plantarflexion.  He has positive straight leg raise reproducing pain that radiates from the buttock down to the  right knee.  Positive FADIR sign on the right, negative on the left.  Positive Stinchfield sign on the right, negative on the left.  Both these signs reproduce groin pain.  No knee effusion noted.  No significant pain with passive motion of the right knee.  Ambulates without Trendelenburg gait.  Specialty Comments:  No specialty comments available.  Imaging: No results found.   PMFS History: Patient Active Problem List   Diagnosis Date Noted   GERD (gastroesophageal reflux disease) 01/13/2019   History of left radical nephrectomy 01/13/2019   Renal cell carcinoma of left kidney ([place]) 12/07/2017   Prolapsed internal hemorrhoids, grade 2 06/19/2017   Pure hypercholesterolemia 03/30/2017   History of cocaine abuse ([place]) 03/23/2017   Past Medical History:  Diagnosis Date   Cancer ([place])    Left kidney   Hyperlipidemia    Internal hemorrhoids    Polycystic liver disease    Substance abuse ([place])     Family History  Problem Relation Age of Onset   Diabetes Mother    Hypertension Mother    Alcohol abuse Mother    Diabetes Father    Kidney disease Father    Diabetes Sister    Stroke Sister    Diabetes Paternal Grandmother    Cancer Paternal Grandfather        type unknown   Colon cancer Neg Hx     Past Surgical History:  Procedure Laterality Date   COLONOSCOPY     HEMORRHOID BANDING     ROBOT ASSISTED LAPAROSCOPIC NEPHRECTOMY Left 12/07/2017   Procedure: XI ROBOTIC ASSISTED LAPAROSCOPIC NEPHRECTOMY;  Surgeon: Cleon Gustin, MD;  Location: [facility];  Service: Urology;  Laterality: Left;   Social History   Occupational History    Employer: [employer]  Tobacco Use   Smoking status: Former    Types: Cigarettes    Quit date: 12/03/2012    Years since quitting: 10.0   Smokeless tobacco: Never  Vaping Use   Vaping Use: Never used  Substance and Sexual Activity   Alcohol use: Not Currently    Comment: Occasional   Drug use: Yes    Types: Marijuana    Comment: was  addicted to cocaine for 23 yrs but clean now for about  12-13 yrs; last  marijuana  use was this mo rning    Sexual activity: Yes

## 2022-12-26 ENCOUNTER — Other Ambulatory Visit: Payer: Self-pay

## 2022-12-26 DIAGNOSIS — M5116 Intervertebral disc disorders with radiculopathy, lumbar region: Secondary | ICD-10-CM

## 2022-12-26 DIAGNOSIS — M5416 Radiculopathy, lumbar region: Secondary | ICD-10-CM

## 2022-12-31 ENCOUNTER — Ambulatory Visit (HOSPITAL_COMMUNITY)
Admission: RE | Admit: 2022-12-31 | Discharge: 2022-12-31 | Disposition: A | Discharge status: Home / Self Care | Payer: Medicaid Other | Source: Ambulatory Visit | Attending: Family Medicine | Admitting: Family Medicine

## 2022-12-31 ENCOUNTER — Encounter (HOSPITAL_COMMUNITY): Payer: Self-pay

## 2022-12-31 VITALS — BP 145/84 | HR 57 | Temp 98.6°F | Resp 14

## 2022-12-31 DIAGNOSIS — Z202 Contact with and (suspected) exposure to infections with a predominantly sexual mode of transmission: Secondary | ICD-10-CM | POA: Diagnosis not present

## 2022-12-31 NOTE — ED Triage Notes (Signed)
Pt reports that girlfriend tested for gonorrhea and needs testing/treatment. Denies any s/s.

## 2022-12-31 NOTE — ED Provider Notes (Signed)
  [place]   [id number] 12/31/22 Arrival Time: 1522  ASSESSMENT & PLAN:  1. Possible exposure to STD    No empiric tx.  Pending: Labs Reviewed  CYTOLOGY, (ORAL, ANAL, URETHRAL) ANCILLARY ONLY    Will notify of any positive results. Instructed to refrain from sexual activity for at least seven days.  Reviewed expectations re: course of current medical issues. Questions answered. Outlined signs and symptoms indicating need for more acute intervention. Patient verbalized understanding. After Visit Summary given.   SUBJECTIVE:  Garrett Clark is a 62 y.o. male who reports girlfriend tested + for gonorrhea. He has no symptoms. Desires testing.  OBJECTIVE:  Vitals:   12/31/22 1553  BP: (!) 145/84  Pulse: (!) 57  Resp: 14  Temp: 98.6 F (37 C)  TempSrc: Oral  SpO2: 99%    General appearance: alert, cooperative, appears stated age and no distress GU: normal appearing genitalia Skin: warm and dry Psychological: alert and cooperative; normal mood and affect.    Labs Reviewed  CYTOLOGY, (ORAL, ANAL, URETHRAL) ANCILLARY ONLY    No Known Allergies  Past Medical History:  Diagnosis Date   Cancer ([place])    Left kidney   Hyperlipidemia    Internal hemorrhoids    Polycystic liver disease    Substance abuse ([place])    Family History  Problem Relation Age of Onset   Diabetes Mother    Hypertension Mother    Alcohol abuse Mother    Diabetes Father    Kidney disease Father    Diabetes Sister    Stroke Sister    Diabetes Paternal Grandmother    Cancer Paternal Grandfather        type unknown   Colon cancer Neg Hx    Social History   Socioeconomic History   Marital status: Single    Spouse name: Not on file   Number of children: 2   Years of education: 12   Highest education level: Not on file  Occupational History    Employer: [employer]  Tobacco Use   Smoking status: Former    Types: Cigarettes    Quit date: 12/03/2012    Years since  quitting: 10.0   Smokeless tobacco: Never  Vaping Use   Vaping Use: Never used  Substance and Sexual Activity   Alcohol use: Not Currently    Comment: Occasional   Drug use: Yes    Types: Marijuana    Comment: was addicted to cocaine for 23 yrs but clean now for about  12-13 yrs; last  marijuana  use was this mo rning    Sexual activity: Yes  Other Topics Concern   Not on file  Social History Narrative   Lives with girlfriend and 3 year old daughter.  Has 2 children.  Works at [employer].  Education: high school.    Social Determinants of Health   Financial Resource Strain: Not on file  Food Insecurity: Not on file  Transportation Needs: Not on file  Physical Activity: Not on file  Stress: Not on file  Social Connections: Not on file  Intimate Partner Violence: Not on file           Vanessa Kick, MD 12/31/22 [phone]

## 2023-01-01 ENCOUNTER — Telehealth (HOSPITAL_COMMUNITY): Payer: Self-pay | Admitting: Emergency Medicine

## 2023-01-01 LAB — CYTOLOGY, (ORAL, ANAL, URETHRAL) ANCILLARY ONLY
Chlamydia: NEGATIVE
Comment: NEGATIVE
Comment: NEGATIVE
Comment: NORMAL
Neisseria Gonorrhea: NEGATIVE
Trichomonas: POSITIVE — AB

## 2023-01-01 MED ORDER — METRONIDAZOLE 500 MG PO TABS: 2000.0000 mg | ORAL_TABLET | Freq: Once | ORAL | 0 refills | Status: AC

## 2023-01-02 ENCOUNTER — Other Ambulatory Visit (INDEPENDENT_AMBULATORY_CARE_PROVIDER_SITE_OTHER): Payer: Self-pay

## 2023-01-02 ENCOUNTER — Ambulatory Visit (INDEPENDENT_AMBULATORY_CARE_PROVIDER_SITE_OTHER): Payer: Self-pay

## 2023-01-02 DIAGNOSIS — M79601 Pain in right arm: Secondary | ICD-10-CM

## 2023-01-02 MED ORDER — GABAPENTIN 300 MG PO CAPS: ORAL_CAPSULE | ORAL | 0 refills | Status: DC

## 2023-01-02 NOTE — Telephone Encounter (Signed)
Requested Prescriptions  Pending Prescriptions Disp Refills   gabapentin (NEURONTIN) 300 MG capsule 180 capsule 0    Sig: Take 1 capsule '300mg'$  by mouth twice dailyTAKE 1 CAPSULE(300 MG) BY MOUTH TWICE DAILY     Neurology: Anticonvulsants - gabapentin Failed - 01/02/2023  1:55 PM      Failed - Cr in normal range and within 360 days    Creat  Date Value Ref Range Status  01/30/2020 1.65 (H) 0.70 - 1.33 mg/dL Final    Comment:    For patients >73 years of age, the reference limit for Creatinine is approximately 13% higher for people identified as African-American. .    Creatinine, Ser  Date Value Ref Range Status  08/21/2022 1.51 (H) 0.76 - 1.27 mg/dL Final         Passed - Completed PHQ-2 or PHQ-9 in the last 360 days      Passed - Valid encounter within last 12 months    Recent Outpatient Visits           3 weeks ago Chronic right-sided low back pain without sciatica   [place] Kerin Perna, NP   5 months ago Blood pressure check   [facility] Kerin Perna, NP   6 months ago Need for shingles vaccine   [facility] Kerin Perna, NP   9 months ago Family history of prostate cancer   [place] Kerin Perna, NP   11 months ago Essential hypertension   [place], [place], NP       Future Appointments             In 2 weeks Oletta Lamas, Mil[place] Cage, NP [place]   In 2 weeks Magnant, Gerrianne Scale, PA-C [place]

## 2023-01-02 NOTE — Telephone Encounter (Signed)
  Chief Complaint: Out of Gabapentin Symptoms: pain Frequency:  Pertinent Negatives: Patient denies  Disposition: '[]'$ ED /'[]'$ Urgent Care (no appt availability in office) / '[]'$ Appointment(In office/virtual)/ '[]'$  [facility]/ '[]'$ Home Care/ '[]'$ Refused Recommended Disposition /'[]'$ [facility]/ '[]'$  Follow-up with PCP Additional Notes: Pt needs refill of Gabapentin   Summary: medication   Patient states that the pharmacy will not refill his gabapentin (NEURONTIN) 300 MG capsule. He has to be seen by his pcp for refill. Patient does have a appointment for 01/19/23 and is wanting to know what he can take. He is hurting and is in the bed. Please advise     Reason for Disposition  [1] Prescription refill request for NON-ESSENTIAL medicine (i.e., no harm to patient if med not taken) AND [2] triager unable to refill per department policy  Answer Assessment - Initial Assessment Questions 1. DRUG NAME: "What medicine do you need to have refilled?"     Gabapentin 2. REFILLS REMAINING: "How many refills are remaining?" (Note: The label on the medicine or pill bottle will show how many refills are remaining. If there are no refills remaining, then a renewal may be needed.)      3. EXPIRATION DATE: "What is the expiration date?" (Note: The label states when the prescription will expire, and thus can no longer be refilled.)      4. PRESCRIBING HCP: "Who prescribed it?" Reason: If prescribed by specialist, call should be referred to that group.     Juluis Mire 5. SYMPTOMS: "Do you have any symptoms?"     Pain 6. PREGNANCY: "Is there any chance that you are pregnant?" "When was your last menstrual period?"  Protocols used: Medication Refill and Renewal Call-A-AH

## 2023-01-08 ENCOUNTER — Ambulatory Visit (INDEPENDENT_AMBULATORY_CARE_PROVIDER_SITE_OTHER): Payer: Medicaid Other | Admitting: Physical Medicine and Rehabilitation

## 2023-01-08 ENCOUNTER — Ambulatory Visit: Payer: Self-pay

## 2023-01-08 VITALS — BP 127/78 | HR 58

## 2023-01-08 DIAGNOSIS — M5416 Radiculopathy, lumbar region: Secondary | ICD-10-CM

## 2023-01-08 MED ORDER — METHYLPREDNISOLONE ACETATE 80 MG/ML IJ SUSP
80.0000 mg | Freq: Once | INTRAMUSCULAR | Status: AC
  Administered 2023-01-08: 80 mg

## 2023-01-08 NOTE — Patient Instructions (Signed)

## 2023-01-08 NOTE — Progress Notes (Signed)
Functional Pain Scale - descriptive words and definitions  Mild (2)   Noticeable when not distracted/no impact on ADL's/sleep only slightly affected and able to   use both passive and active distraction for comfort. Mild range order  Average Pain 1   +Driver, -BT, -Dye Allergies.

## 2023-01-19 ENCOUNTER — Ambulatory Visit (INDEPENDENT_AMBULATORY_CARE_PROVIDER_SITE_OTHER): Payer: Medicaid Other | Admitting: Primary Care

## 2023-01-19 ENCOUNTER — Encounter (INDEPENDENT_AMBULATORY_CARE_PROVIDER_SITE_OTHER): Payer: Self-pay | Admitting: Primary Care

## 2023-01-19 VITALS — BP 150/92 | HR 109 | Resp 16 | Ht 69.0 in | Wt 194.6 lb

## 2023-01-19 DIAGNOSIS — I10 Essential (primary) hypertension: Secondary | ICD-10-CM

## 2023-01-19 DIAGNOSIS — M5441 Lumbago with sciatica, right side: Secondary | ICD-10-CM

## 2023-01-19 NOTE — Patient Instructions (Signed)
Hypertension, Adult Hypertension is another name for high blood pressure. High blood pressure forces your heart to work harder to pump blood. This can cause problems over time. There are two numbers in a blood pressure reading. There is a top number (systolic) over a bottom number (diastolic). It is best to have a blood pressure that is below 120/80. What are the causes? The cause of this condition is not known. Some other conditions can lead to high blood pressure. What increases the risk? Some lifestyle factors can make you more likely to develop high blood pressure: Smoking. Not getting enough exercise or physical activity. Being overweight. Having too much fat, sugar, calories, or salt (sodium) in your diet. Drinking too much alcohol. Other risk factors include: Having any of these conditions: Heart disease. Diabetes. High cholesterol. Kidney disease. Obstructive sleep apnea. Having a family history of high blood pressure and high cholesterol. Age. The risk increases with age. Stress. What are the signs or symptoms? High blood pressure may not cause symptoms. Very high blood pressure (hypertensive crisis) may cause: Headache. Fast or uneven heartbeats (palpitations). Shortness of breath. Nosebleed. Vomiting or feeling like you may vomit (nauseous). Changes in how you see. Very bad chest pain. Feeling dizzy. Seizures. How is this treated? This condition is treated by making healthy lifestyle changes, such as: Eating healthy foods. Exercising more. Drinking less alcohol. Your doctor may prescribe medicine if lifestyle changes do not help enough and if: Your top number is above 130. Your bottom number is above 80. Your personal target blood pressure may vary. Follow these instructions at home: Eating and drinking  If told, follow the DASH eating plan. To follow this plan: Fill one half of your plate at each meal with fruits and vegetables. Fill one fourth of your plate  at each meal with whole grains. Whole grains include whole-wheat pasta, brown rice, and whole-grain bread. Eat or drink low-fat dairy products, such as skim milk or low-fat yogurt. Fill one fourth of your plate at each meal with low-fat (lean) proteins. Low-fat proteins include fish, chicken without skin, eggs, beans, and tofu. Avoid fatty meat, cured and processed meat, or chicken with skin. Avoid pre-made or processed food. Limit the amount of salt in your diet to less than 1,500 mg each day. Do not drink alcohol if: Your doctor tells you not to drink. You are pregnant, may be pregnant, or are planning to become pregnant. If you drink alcohol: Limit how much you have to: 0-1 drink a day for women. 0-2 drinks a day for men. Know how much alcohol is in your drink. In the [place] one drink equals one 12 oz bottle of beer (355 mL), one 5 oz glass of wine (148 mL), or one 1 oz glass of hard liquor (44 mL). Lifestyle  Work with your doctor to stay at a healthy weight or to lose weight. Ask your doctor what the best weight is for you. Get at least 30 minutes of exercise that causes your heart to beat faster (aerobic exercise) most days of the week. This may include walking, swimming, or biking. Get at least 30 minutes of exercise that strengthens your muscles (resistance exercise) at least 3 days a week. This may include lifting weights or doing Pilates. Do not smoke or use any products that contain nicotine or tobacco. If you need help quitting, ask your doctor. Check your blood pressure at home as told by your doctor. Keep all follow-up visits. Medicines Take over-the-counter and prescription medicines  only as told by your doctor. Follow directions carefully. ?Do not skip doses of blood pressure medicine. The medicine does not work as well if you skip doses. Skipping doses also puts you at risk for problems. ?Ask your doctor about side effects or reactions to medicines that you should watch  for. ?Contact a doctor if: ?You think you are having a reaction to the medicine you are taking. ?You have headaches that keep coming back. ?You feel dizzy. ?You have swelling in your ankles. ?You have trouble with your vision. ?Get help right away if: ?You get a very bad headache. ?You start to feel mixed up (confused). ?You feel weak or numb. ?You feel faint. ?You have very bad pain in your: ?Chest. ?Belly (abdomen). ?You vomit more than once. ?You have trouble breathing. ?These symptoms may be an emergency. Get help right away. Call 911. ?Do not wait to see if the symptoms will go away. ?Do not drive yourself to the hospital. ?Summary ?Hypertension is another name for high blood pressure. ?High blood pressure forces your heart to work harder to pump blood. ?For most people, a normal blood pressure is less than 120/80. ?Making healthy choices can help lower blood pressure. If your blood pressure does not get lower with healthy choices, you may need to take medicine. ?This information is not intended to replace advice given to you by your health care provider. Make sure you discuss any questions you have with your health care provider. ?Document Revised: 09/26/2021 Document Reviewed: 09/26/2021 ?Elsevier Patient Education ? 2023 [employer] Inc[employer] ? ?

## 2023-01-19 NOTE — Progress Notes (Signed)
[place]  Garrett Clark, is a 62 y.o. male  NTZ:[id number]  QPR:[id number]  DOB - Oct 24, 1961  Chief Complaint  Patient presents with   Hypertension       Subjective:   [name] is a 62 y.o. male here today for a follow up visit for blood pressure management.  Prior to the game he he got into a verbal altercation and was very upset which can also contribute to his elevated blood pressure at this time.  He also is still experiencing pain on the left side and followed by orthopedics.  Patient has No headache, No chest pain, No abdominal pain - No Nausea, No new weakness tingling or numbness, No Cough - shortness of breath  No problems updated.  No Known Allergies  Past Medical History:  Diagnosis Date   Cancer ([place])    Left kidney   Hyperlipidemia    Internal hemorrhoids    Polycystic liver disease    Substance abuse ([place])     Current Outpatient Medications on File Prior to Visit  Medication Sig Dispense Refill   atorvastatin (LIPITOR) 40 MG tablet TAKE 1 TABLET(40 MG) BY MOUTH DAILY 90 tablet 1   atorvastatin (LIPITOR) 80 MG tablet TAKE 1 TABLET(80 MG) BY MOUTH DAILY 90 tablet 1   cyclobenzaprine (FLEXERIL) 10 MG tablet TAKE 1 TABLET(10 MG) BY MOUTH THREE TIMES DAILY AS NEEDED FOR MUSCLE SPASMS 90 tablet 1   fenofibrate (TRICOR) 48 MG tablet Take 1 tablet (48 mg total) by mouth daily. 90 tablet 1   gabapentin (NEURONTIN) 300 MG capsule Take 1 capsule '300mg'$  by mouth twice dailyTAKE 1 CAPSULE(300 MG) BY MOUTH TWICE DAILY 180 capsule 0   omeprazole (PRILOSEC) 20 MG capsule Take 1 capsule (20 mg total) by mouth daily. 30 capsule 3   valsartan-hydrochlorothiazide (DIOVAN-HCT) 80-12.5 MG tablet Take 1 tablet by mouth daily. 90 tablet 3   Current Facility-Administered Medications on File Prior to Visit  Medication Dose Route Frequency Provider Last Rate Last Admin   methylPREDNISolone acetate (DEPO-MEDROL) injection 80 mg  80 mg Other Once Magnus Sinning, MD       Comprehensive ROS Pertinent positive and negative noted in HPI   Objective:   Vitals:   01/19/23 1334  BP: (Abnormal) 190/93  Pulse: (Abnormal) 109  Resp: 16  SpO2: 99%  Weight: 194 lb 9.6 oz (88.3 kg)  Height: '5\' 9"'$  (1.753 m)    Exam General appearance : Awake, alert, not in any distress. Speech Clear. Not toxic looking HEENT: Atraumatic and Normocephalic, pupils equally reactive to light and accomodation Neck: Supple, no JVD. No cervical lymphadenopathy.  Chest: Good air entry bilaterally, no added sounds  CVS: S1 S2 regular, no murmurs.  Abdomen: Bowel sounds present, Non tender and not distended with no gaurding, rigidity or rebound. Extremities: B/L Lower Ext shows no edema, both legs are warm to touch Neurology: Awake alert, and oriented X 3,  Non focal Skin: No Rash  Data Review No results found for: "HGBA1C"  Assessment & Plan  Garrett Clark was seen today for hypertension.  Diagnoses and all orders for this visit:  Essential hypertension Endorses taking Bp meds daily but today upset before and during visit this visit seen by orth on 01/08/23 BP was 127/78 Explained that having normal blood pressure is the goal and medications are helping to get to goal and maintain normal blood pressure. DIET: Limit salt intake, read nutrition labels to check salt content, limit fried and high fatty foods  Avoid using multisymptom OTC cold preparations that generally contain sudafed which can rise BP. Consult with pharmacist on best cold relief products to use for persons with HTN EXERCISE Discussed incorporating exercise such as walking - 30 minutes most days of the week and can do in 10 minute intervals     Low back pain with right-sided sciatica, unspecified back pain laterality, unspecified chronicity Followed by ortho  Was on gabapentin prior to which helps with the pain.   Patient have been counseled extensively about nutrition and exercise. Other issues  discussed during this visit include: low cholesterol diet, weight control and daily exercise, foot care, annual eye examinations at Ophthalmology, importance of adherence with medications and regular follow-up. We also discussed long term complications of uncontrolled diabetes and hypertension.   Return in about 2 weeks (around 02/02/2023) for Bp ck nurse visit .  The patient was given clear instructions to go to ER or return to medical center if symptoms don't improve, worsen or new problems develop. The patient verbalized understanding. The patient was told to call to get lab results if they haven't heard anything in the next week.   This note has been created with Surveyor, quantity. Any transcriptional errors are unintentional.   Kerin Perna, NP 01/19/2023, 1:56 PM

## 2023-01-20 NOTE — Procedures (Signed)
Lumbar Epidural Steroid Injection - Interlaminar Approach with Fluoroscopic Guidance  Patient: Garrett Clark      Date of Birth: 20-Nov-1961 MRN: [id number] PCP: Kerin Perna, NP      Visit Date: 01/08/2023   Universal Protocol:     Consent Given By: the patient  Position: PRONE  Additional Comments: Vital signs were monitored before and after the procedure. Patient was prepped and draped in the usual sterile fashion. The correct patient, procedure, and site was verified.   Injection Procedure Details:   Procedure diagnoses: Lumbar radiculopathy [M54.16]   Meds Administered:  Meds ordered this encounter  Medications   methylPREDNISolone acetate (DEPO-MEDROL) injection 80 mg     Laterality: Right  Location/Site:  L5-S1  Needle: 3.5 in., 20 ga. Tuohy  Needle Placement: Paramedian epidural  Findings:   -Comments: Excellent flow of contrast into the epidural space.  Procedure Details: Using a paramedian approach from the side mentioned above, the region overlying the inferior lamina was localized under fluoroscopic visualization and the soft tissues overlying this structure were infiltrated with 4 ml. of 1% Lidocaine without Epinephrine. The Tuohy needle was inserted into the epidural space using a paramedian approach.   The epidural space was localized using loss of resistance along with counter oblique bi-planar fluoroscopic views.  After negative aspirate for air, blood, and CSF, a 2 ml. volume of Isovue-250 was injected into the epidural space and the flow of contrast was observed. Radiographs were obtained for documentation purposes.    The injectate was administered into the level noted above.   Additional Comments:  No complications occurred Dressing: 2 x 2 sterile gauze and Band-Aid    Post-procedure details: Patient was observed during the procedure. Post-procedure instructions were reviewed.  Patient left the clinic in stable condition.

## 2023-01-20 NOTE — Progress Notes (Signed)
Garrett Clark - 62 y.o. male MRN [id number]  Date of birth: November 12, 1961  Office Visit Note: Visit Date: 01/08/2023 PCP: Kerin Perna, NP Referred by: Kerin Perna, NP  Subjective: Chief Complaint  Patient presents with   Lower Back - Pain   HPI:  Garrett Clark is a 62 y.o. male who comes in today at the request of Annie Main, PA-C for planned Right L5-S1 Lumbar Interlaminar epidural steroid injection with fluoroscopic guidance.  The patient has failed conservative care including home exercise, medications, time and activity modification.  This injection will be diagnostic and hopefully therapeutic.  Please see requesting physician notes for further details and justification.   ROS Otherwise per HPI.  Assessment & Plan: Visit Diagnoses:    ICD-10-CM   1. Lumbar radiculopathy  M54.16 XR C-ARM NO REPORT    Epidural Steroid injection    methylPREDNISolone acetate (DEPO-MEDROL) injection 80 mg      Plan: No additional findings.   Meds & Orders:  Meds ordered this encounter  Medications   methylPREDNISolone acetate (DEPO-MEDROL) injection 80 mg    Orders Placed This Encounter  Procedures   XR C-ARM NO REPORT   Epidural Steroid injection    Follow-up: Return for visit to requesting provider as needed.   Procedures: No procedures performed  Lumbar Epidural Steroid Injection - Interlaminar Approach with Fluoroscopic Guidance  Patient: Garrett Clark      Date of Birth: 1961-04-05 MRN: [id number] PCP: Kerin Perna, NP      Visit Date: 01/08/2023   Universal Protocol:     Consent Given By: the patient  Position: PRONE  Additional Comments: Vital signs were monitored before and after the procedure. Patient was prepped and draped in the usual sterile fashion. The correct patient, procedure, and site was verified.   Injection Procedure Details:   Procedure diagnoses: Lumbar radiculopathy [M54.16]   Meds Administered:  Meds ordered this  encounter  Medications   methylPREDNISolone acetate (DEPO-MEDROL) injection 80 mg     Laterality: Right  Location/Site:  L5-S1  Needle: 3.5 in., 20 ga. Tuohy  Needle Placement: Paramedian epidural  Findings:   -Comments: Excellent flow of contrast into the epidural space.  Procedure Details: Using a paramedian approach from the side mentioned above, the region overlying the inferior lamina was localized under fluoroscopic visualization and the soft tissues overlying this structure were infiltrated with 4 ml. of 1% Lidocaine without Epinephrine. The Tuohy needle was inserted into the epidural space using a paramedian approach.   The epidural space was localized using loss of resistance along with counter oblique bi-planar fluoroscopic views.  After negative aspirate for air, blood, and CSF, a 2 ml. volume of Isovue-250 was injected into the epidural space and the flow of contrast was observed. Radiographs were obtained for documentation purposes.    The injectate was administered into the level noted above.   Additional Comments:  No complications occurred Dressing: 2 x 2 sterile gauze and Band-Aid    Post-procedure details: Patient was observed during the procedure. Post-procedure instructions were reviewed.  Patient left the clinic in stable condition.   Clinical History: No specialty comments available.     Objective:  VS:  HT:    WT:   BMI:     BP:127/78  HR:(!) 58bpm  TEMP: ( )  RESP:  Physical Exam Vitals and nursing note reviewed.  Constitutional:      General: He is not in acute distress.    Appearance: Normal  appearance. He is not ill-appearing.  HENT:     Head: Normocephalic and atraumatic.     Right Ear: External ear normal.     Left Ear: External ear normal.     Nose: No congestion.  Eyes:     Extraocular Movements: Extraocular movements intact.  Cardiovascular:     Rate and Rhythm: Normal rate.     Pulses: Normal pulses.  Pulmonary:      Effort: Pulmonary effort is normal. No respiratory distress.  Abdominal:     General: There is no distension.     Palpations: Abdomen is soft.  Musculoskeletal:        General: No tenderness or signs of injury.     Cervical back: Neck supple.     Right lower leg: No edema.     Left lower leg: No edema.     Comments: Patient has good distal strength without clonus.  Skin:    Findings: No erythema or rash.  Neurological:     General: No focal deficit present.     Mental Status: He is alert and oriented to person, place, and time.     Sensory: No sensory deficit.     Motor: No weakness or abnormal muscle tone.     Coordination: Coordination normal.  Psychiatric:        Mood and Affect: Mood normal.        Behavior: Behavior normal.      Imaging: No results found.

## 2023-01-21 ENCOUNTER — Ambulatory Visit (INDEPENDENT_AMBULATORY_CARE_PROVIDER_SITE_OTHER): Payer: Medicaid Other | Admitting: Surgical

## 2023-01-21 ENCOUNTER — Encounter: Payer: Self-pay | Admitting: Surgical

## 2023-01-21 DIAGNOSIS — M5416 Radiculopathy, lumbar region: Secondary | ICD-10-CM

## 2023-01-21 NOTE — Progress Notes (Signed)
Follow-up Office Visit Note   Patient: Garrett Clark           Date of Birth: [birth year]           MRN: [id number] Visit Date: 01/21/2023 Requested by: Kerin Perna, NP [address] [place],  [place] [address] PCP: Kerin Perna, NP  Subjective: Chief Complaint  Patient presents with   Lower Back - Pain    HPI: Garrett Clark is a 62 y.o. male who returns to the office for follow-up visit.    Plan at last visit was: Patient is a 62 year old male who presents for evaluation of right leg pain. Has history of of L4-L5 right-sided foraminal disc protrusion which she has had ESI's for in the past that provided good relief. Most of his symptoms seem related to his back based on the radicular nature extending from the back into the buttock and down to the posterior right knee. It is reproduced with straight leg raise and is associated with burning and tingling. However, he does have some groin pain that he has not really had in the past that is reproducible with range of motion of the hip joint as well as positive Stinchfield sign today. He has negative radiographs with no significant findings on hip x-rays today. Plan to try diagnostic lumbar spine ESI with Dr. Ernestina Patches. Follow-up in 5 weeks for clinical recheck so that we may reassess how his pain is doing after the ESI. At that point, if he is having continued pain that was not improved with ESI, could consider either hip injection or MRI of the pelvis for further evaluation of occult hip arthritis. Patient agreed with this plan. If he has significant relief of all of his symptoms at that time but relief is short-lived, may refer to Dr. Laurance Flatten for consideration of surgical intervention   Since then, patient notes he had lumbar spine ESI with Dr. Ernestina Patches that provided near 100% relief of his right-sided radicular symptoms for about 1 week before symptoms returned.  He is still having pain in his low back and buttock that radiates into  the posterior aspect of the thigh and knee with tingling sensation.  No groin pain.  Describes pain in this region as a cramping sensation.  Taking gabapentin and Tylenol which help ease it off but do not resolve it.  He is also had a little bit of increased left low back pain over the last week as well.              ROS: All systems reviewed are negative as they relate to the chief complaint within the history of present illness.  Patient denies fevers or chills.  Assessment & Plan: Visit Diagnoses:  1. Lumbar radiculopathy     Plan: Garrett Clark is a 62 y.o. male who returns to the office for follow-up visit for right leg radicular pain.  Plan from last visit was noted above in HPI.  They now return with significant relief from [facility] by Dr. Ernestina Patches that provided 100% relief but only lasted for about a week.  He now returns with the same symptoms he had prior to injection.  This is bothering him enough to consider surgery but he is not 100% on board for spine surgery.  Plan for him to follow-up with Dr. Laurance Flatten for potential surgical discussion versus consideration of other nonoperative treatment.  Follow-Up Instructions: No follow-ups on file.   Orders:  Orders Placed This Encounter  Procedures  Ambulatory referral to Orthopedic Surgery   No orders of the defined types were placed in this encounter.     Procedures: No procedures performed   Clinical Data: No additional findings.  Objective: Vital Signs: There were no vitals taken for this visit.  Physical Exam:  Constitutional: Patient appears well-developed HEENT:  Head: Normocephalic Eyes:EOM are normal Neck: Normal range of motion Cardiovascular: Normal rate Pulmonary/chest: Effort normal Neurologic: Patient is alert Skin: Skin is warm Psychiatric: Patient has normal mood and affect  Ortho Exam: Ortho exam demonstrates 5/5 motor strength of bilateral hip flexion, quadricep, hamstring, dorsiflexion, plantarflexion.   Negative straight leg raise bilaterally.  No pain with hip range of motion.  Negative Stinchfield sign bilaterally.  Ambulates without Trendelenburg gait.  Specialty Comments:  No specialty comments available.  Imaging: No results found.   PMFS History: Patient Active Problem List   Diagnosis Date Noted   GERD (gastroesophageal reflux disease) 01/13/2019   History of left radical nephrectomy 01/13/2019   Renal cell carcinoma of left kidney ([place]) 12/07/2017   Prolapsed internal hemorrhoids, grade 2 06/19/2017   Pure hypercholesterolemia 03/30/2017   History of cocaine abuse ([place]) 03/23/2017   Past Medical History:  Diagnosis Date   Cancer ([place])    Left kidney   Hyperlipidemia    Internal hemorrhoids    Polycystic liver disease    Substance abuse ([place])     Family History  Problem Relation Age of Onset   Diabetes Mother    Hypertension Mother    Alcohol abuse Mother    Diabetes Father    Kidney disease Father    Diabetes Sister    Stroke Sister    Diabetes Paternal Grandmother    Cancer Paternal Grandfather        type unknown   Colon cancer Neg Hx     Past Surgical History:  Procedure Laterality Date   COLONOSCOPY     HEMORRHOID BANDING     ROBOT ASSISTED LAPAROSCOPIC NEPHRECTOMY Left 12/07/2017   Procedure: XI ROBOTIC ASSISTED LAPAROSCOPIC NEPHRECTOMY;  Surgeon: Cleon Gustin, MD;  Location: [facility];  Service: Urology;  Laterality: Left;   Social History   Occupational History    Employer: [employer]  Tobacco Use   Smoking status: Former    Types: Cigarettes    Quit date: 12/03/2012    Years since quitting: 10.1   Smokeless tobacco: Never  Vaping Use   Vaping Use: Never used  Substance and Sexual Activity   Alcohol use: Not Currently    Comment: Occasional   Drug use: Yes    Types: Marijuana    Comment: was addicted to cocaine for 23 yrs but clean now for about  12-13 yrs; last  marijuana  use was this mo rning    Sexual activity: Yes

## 2023-01-23 ENCOUNTER — Ambulatory Visit (INDEPENDENT_AMBULATORY_CARE_PROVIDER_SITE_OTHER): Payer: Medicaid Other | Admitting: Orthopedic Surgery

## 2023-01-23 ENCOUNTER — Ambulatory Visit (INDEPENDENT_AMBULATORY_CARE_PROVIDER_SITE_OTHER): Payer: Medicaid Other

## 2023-01-23 ENCOUNTER — Encounter: Payer: Self-pay | Admitting: Orthopedic Surgery

## 2023-01-23 VITALS — BP 129/78 | HR 79 | Ht 69.0 in | Wt 195.0 lb

## 2023-01-23 DIAGNOSIS — M5416 Radiculopathy, lumbar region: Secondary | ICD-10-CM

## 2023-01-23 NOTE — Progress Notes (Signed)
Orthopedic Spine Surgery Office Note  Assessment: Patient is a 62 y.o. male with left sided buttock and posterior thigh pain, possible radiculopathy   Plan: -Explained that initially conservative treatment is tried as a significant number of patients may experience relief with these treatment modalities. Discussed that the conservative treatments include:  -activity modification  -physical therapy  -over the counter pain medications  -medrol dosepak  -lumbar steroid injections -Patient has tried physical therapy, activity modification, steroid injection  -Patient has done 6 weeks of conservative treatment under the care of Dr. Marlou Sa with physical therapy and steroid injection but has not had relief of his pain so recommended MRI of the lumbar spine to evaluate for radiculopathy -Patient should return to office in 5 weeks, x-rays at next visit: none   Patient expressed understanding of the plan and all questions were answered to the patient's satisfaction.   ___________________________________________________________________________   History:  Patient is a 62 y.o. male who presents today for lumbar spine.  Patient has had over 2 years of right buttock and right posterior thigh pain.  There is no trauma or injury that brought on the pain.  Pain has gotten progressively worse with time.  He feels on a daily basis.  He is not sure of anything that aggravates it or makes it better.  He has tried physical therapy and injection.  The injection provided him with very little relief.  No symptoms on the left side.   Weakness: denies Symptoms of imbalance: denies Paresthesias and numbness: denies Bowel or bladder incontinence: denies  Saddle anesthesia: denies   Treatments tried: physical therapy, activity modification, steroid injection  Review of systems: Denies fevers and chills, night sweats, unexplained weight loss,  pain that wakes them at night. History of kidney cancer  Past  medical history: HTN HLD Hemorrhoids Polycystic liver disease History of substance abuse  Allergies: NKDA  Past surgical history:  Hemorrhoid banding Left nephrectomy   Social history: Denies use of nicotine product (smoking, vaping, patches, smokeless) Alcohol use: denies  Reports marijuana, denies other recreational drug use   Physical Exam:  General: no acute distress, appears stated age Neurologic: alert, answering questions appropriately, following commands Respiratory: unlabored breathing on room air, symmetric chest rise Psychiatric: appropriate affect, normal cadence to speech   MSK (spine):  -Strength exam      Left  Right EHL    5/5  5/5 TA    5/5  5/5 GSC    5/5  5/5 Knee extension  5/5  5/5 Hip flexion   5/5  5/5  -Sensory exam    Sensation intact to light touch in L3-S1 nerve distributions of bilateral lower extremities  -Achilles DTR: 1/4 on the left, 1/4 on the right -Patellar tendon DTR: 1/4 on the left, 1/4 on the right  -Straight leg raise: negative -Contralateral straight leg raise: negative -Femoral nerve stretch test: negative bilaterally -Clonus: no beats bilaterally  -Left hip exam: No pain through range of motion, negative Stinchfield, negative FABER -Right hip exam: No pain through range of motion, negative Stinchfield, negative FABER  Imaging: XR of the lumbar spine from 01/23/2023 was independently reviewed and interpreted, showing disc height loss at L5/S1 and L3/4. No fracture or dislocation. No evidence of instability on flexion/extension views.    Patient name: Garrett Clark Patient MRN: [id number] Date of visit: 01/23/23

## 2023-02-02 ENCOUNTER — Ambulatory Visit (INDEPENDENT_AMBULATORY_CARE_PROVIDER_SITE_OTHER): Payer: Medicaid Other

## 2023-02-03 NOTE — Progress Notes (Signed)
Bp check

## 2023-02-11 ENCOUNTER — Inpatient Hospital Stay: Admission: RE | Admit: 2023-02-11 | Payer: Medicaid Other | Source: Ambulatory Visit

## 2023-02-13 ENCOUNTER — Other Ambulatory Visit: Payer: Medicaid Other

## 2023-02-26 ENCOUNTER — Ambulatory Visit: Payer: Medicaid Other | Admitting: Orthopedic Surgery

## 2023-03-01 ENCOUNTER — Ambulatory Visit
Admission: RE | Admit: 2023-03-01 | Discharge: 2023-03-01 | Disposition: A | Discharge status: Home / Self Care | Payer: Medicaid Other | Source: Ambulatory Visit | Attending: Orthopedic Surgery | Admitting: Orthopedic Surgery

## 2023-03-01 DIAGNOSIS — M545 Low back pain, unspecified: Secondary | ICD-10-CM | POA: Diagnosis not present

## 2023-03-01 DIAGNOSIS — M5416 Radiculopathy, lumbar region: Secondary | ICD-10-CM

## 2023-03-05 ENCOUNTER — Ambulatory Visit (INDEPENDENT_AMBULATORY_CARE_PROVIDER_SITE_OTHER): Payer: Medicaid Other | Admitting: Orthopedic Surgery

## 2023-03-05 DIAGNOSIS — M5432 Sciatica, left side: Secondary | ICD-10-CM

## 2023-03-05 NOTE — Progress Notes (Signed)
Orthopedic Spine Surgery Office Note  Assessment: Patient is a 62 y.o. male with right buttock and thigh pain.  Worse with sitting and resolves with standing.  MRI shows no evidence of nerve root compression   Plan: -I discussed the fact that his MRI does not show any areas of stenosis or nerve root compression.  So I do not think that the pain is coming from his back.  He describes more symptoms of piriformis syndrome or sciatica because he only really gets symptoms when seated.  I described some modifications that he can make to reduce his pain I told him he can use Tylenol/NSAIDs.  There is surgical release of the sciatic nerve but I told is not a procedure I do.  If he gets to the point that he is considering surgery, I would refer him to tertiary center -Patient should return on an as needed basis   Patient expressed understanding of the plan and all questions were answered to the patient's satisfaction.   ___________________________________________________________________________  History: Patient is a 62 y.o. male who has been previously seen in the office for symptoms of right buttock and posterior thigh pain.  Today, he tells me that he has this pain only when he sitting particular for a long time.  He says he gets it frequently when he is driving.  He does not have the pain when he standing.  Standing pretty much relieves the pain.  He has no left-sided symptoms.  Pain does not radiate past the knee.  There have been no interval changes in his symptoms since the last time I saw him.  Previous treatments: physical therapy, activity modification, steroid injection   Physical Exam:  General: no acute distress, appears stated age Neurologic: alert, answering questions appropriately, following commands Respiratory: unlabored breathing on room air, symmetric chest rise Psychiatric: appropriate affect, normal cadence to speech   MSK (spine):  -Strength exam      Left  Right EHL     5/5  5/5 TA    5/5  5/5 GSC    5/5  5/5 Knee extension  5/5  5/5 Hip flexion   5/5  5/5  -Sensory exam    Sensation intact to light touch in L3-S1 nerve distributions of bilateral lower extremities  -Straight leg raise: negative -Contralateral straight leg raise: negative -Femoral nerve stretch test: negative bilaterally -Clonus: no beats bilaterally  Imaging: XR of the lumbar spine from 01/23/2023 was previously independently reviewed and interpreted, showing disc height loss at L5/S1 and L3/4. No fracture or dislocation. No evidence of instability on flexion/extension views.    MRI of the lumbar spine from 03/01/2023 was independently reviewed and interpreted, showing no central, lateral recess, or foraminal stenosis.  Early disc desiccation at L3/4.  No other significant degenerative disc disease.   Patient name: Garrett Clark Patient MRN: [id number] Date of visit: 03/05/23

## 2023-04-01 ENCOUNTER — Other Ambulatory Visit (HOSPITAL_COMMUNITY)
Admission: RE | Admit: 2023-04-01 | Discharge: 2023-04-01 | Disposition: A | Discharge status: Home / Self Care | Payer: Medicaid Other | Source: Ambulatory Visit | Attending: Primary Care | Admitting: Primary Care

## 2023-04-01 ENCOUNTER — Ambulatory Visit (INDEPENDENT_AMBULATORY_CARE_PROVIDER_SITE_OTHER): Payer: Medicaid Other | Admitting: Primary Care

## 2023-04-01 ENCOUNTER — Encounter (INDEPENDENT_AMBULATORY_CARE_PROVIDER_SITE_OTHER): Payer: Self-pay | Admitting: Primary Care

## 2023-04-01 VITALS — BP 144/90 | HR 78 | Temp 97.9°F | Resp 16

## 2023-04-01 DIAGNOSIS — Z0000 Encounter for general adult medical examination without abnormal findings: Secondary | ICD-10-CM | POA: Diagnosis not present

## 2023-04-01 DIAGNOSIS — I10 Essential (primary) hypertension: Secondary | ICD-10-CM

## 2023-04-01 DIAGNOSIS — R300 Dysuria: Secondary | ICD-10-CM | POA: Diagnosis not present

## 2023-04-01 MED ORDER — HYDRALAZINE HCL 25 MG PO TABS
25.0000 mg | ORAL_TABLET | Freq: Once | ORAL | Status: AC
  Administered 2023-04-01: 25 mg via ORAL

## 2023-04-01 NOTE — Progress Notes (Signed)
F/u HTN Refill on cyclobenazeprine

## 2023-04-02 LAB — URINE CYTOLOGY ANCILLARY ONLY
Chlamydia: NEGATIVE
Comment: NEGATIVE
Comment: NEGATIVE
Comment: NORMAL
Neisseria Gonorrhea: NEGATIVE
Trichomonas: NEGATIVE

## 2023-04-05 NOTE — Progress Notes (Signed)
[facility]  Garrett Clark is a 62 y.o. male presents to office today for annual physical exam examination.    Concerns today include: 1.  Hypertension and dysuria  Health Maintenance  Topic Date Due   COVID-19 Vaccine (1) Never done   Zoster Vaccines- Shingrix (2 of 2) 08/07/2022   INFLUENZA VACCINE  07/23/2023   DTaP/Tdap/Td (2 - Td or Tdap) 03/24/2027   COLONOSCOPY (Pts 45-15yrs Insurance coverage will need to be confirmed)  05/09/2027   Hepatitis C Screening  Completed   HIV Screening  Completed   HPV VACCINES  Aged Out     Past Medical History:  Diagnosis Date   Cancer    Left kidney   Hyperlipidemia    Internal hemorrhoids    Polycystic liver disease    Substance abuse    Social History   Socioeconomic History   Marital status: Single    Spouse name: Not on file   Number of children: 2   Years of education: 12   Highest education level: Not on file  Occupational History    Employer: [employer]  Tobacco Use   Smoking status: Former    Types: Cigarettes    Quit date: 12/03/2012    Years since quitting: 10.3   Smokeless tobacco: Never  Vaping Use   Vaping Use: Never used  Substance and Sexual Activity   Alcohol use: Not Currently    Comment: Occasional   Drug use: Yes    Types: Marijuana    Comment: was addicted to cocaine for 23 yrs but clean now for about  12-13 yrs; last  marijuana  use was this mo rning    Sexual activity: Yes  Other Topics Concern   Not on file  Social History Narrative   Lives with girlfriend and 66 year old daughter.  Has 2 children.  Works at [employer].  Education: high school.    Social Determinants of Health   Financial Resource Strain: Not on file  Food Insecurity: Not on file  Transportation Needs: Not on file  Physical Activity: Not on file  Stress: Not on file  Social Connections: Not on file  Intimate Partner Violence: Not on file   Past Surgical History:  Procedure Laterality Date    COLONOSCOPY     HEMORRHOID BANDING     ROBOT ASSISTED LAPAROSCOPIC NEPHRECTOMY Left 12/07/2017   Procedure: XI ROBOTIC ASSISTED LAPAROSCOPIC NEPHRECTOMY;  Surgeon: Malen Gauze, MD;  Location: [facility];  Service: Urology;  Laterality: Left;   Family History  Problem Relation Age of Onset   Diabetes Mother    Hypertension Mother    Alcohol abuse Mother    Diabetes Father    Kidney disease Father    Diabetes Sister    Stroke Sister    Diabetes Paternal Grandmother    Cancer Paternal Grandfather        type unknown   Colon cancer Neg Hx     Current Outpatient Medications:    atorvastatin (LIPITOR) 40 MG tablet, TAKE 1 TABLET(40 MG) BY MOUTH DAILY, Disp: 90 tablet, Rfl: 1   gabapentin (NEURONTIN) 300 MG capsule, Take 1 capsule  by mouth twice dailyTAKE 1 CAPSULE(300 MG) BY MOUTH TWICE DAILY, Disp: 180 capsule, Rfl: 0   valsartan-hydrochlorothiazide (DIOVAN-HCT) 80-12.5 MG tablet, Take 1 tablet by mouth daily., Disp: 90 tablet, Rfl: 3   cyclobenzaprine (FLEXERIL) 10 MG tablet, TAKE 1 TABLET(10 MG) BY MOUTH THREE TIMES DAILY AS NEEDED FOR MUSCLE SPASMS (Patient not taking: Reported  on 04/01/2023), Disp: 90 tablet, Rfl: 1   fenofibrate (TRICOR) 48 MG tablet, Take 1 tablet (48 mg total) by mouth daily. (Patient not taking: Reported on 04/01/2023), Disp: 90 tablet, Rfl: 1 Outpatient Encounter Medications as of 04/01/2023  Medication Sig   atorvastatin (LIPITOR) 40 MG tablet TAKE 1 TABLET(40 MG) BY MOUTH DAILY   gabapentin (NEURONTIN) 300 MG capsule Take 1 capsule  by mouth twice dailyTAKE 1 CAPSULE(300 MG) BY MOUTH TWICE DAILY   valsartan-hydrochlorothiazide (DIOVAN-HCT) 80-12.5 MG tablet Take 1 tablet by mouth daily.   cyclobenzaprine (FLEXERIL) 10 MG tablet TAKE 1 TABLET(10 MG) BY MOUTH THREE TIMES DAILY AS NEEDED FOR MUSCLE SPASMS (Patient not taking: Reported on 04/01/2023)   fenofibrate (TRICOR) 48 MG tablet Take 1 tablet (48 mg total) by mouth daily. (Patient not taking:  Reported on 04/01/2023)   [DISCONTINUED] omeprazole (PRILOSEC) 20 MG capsule Take 1 capsule (20 mg total) by mouth daily. (Patient not taking: Reported on 04/01/2023)   [EXPIRED] hydrALAZINE (APRESOLINE) tablet 25 mg    No facility-administered encounter medications on file as of 04/01/2023.    No Known Allergies   ROS: Review of Systems A comprehensive review of systems was negative.    Physical exam Blood Pressure (Abnormal) 144/90   Pulse 78   Temperature 97.9 F (36.6 C) (Oral)   Respiration 16   Oxygen Saturation 100%  General appearance: alert, cooperative, and appears stated age Head: Normocephalic, without obvious abnormality, atraumatic Eyes: conjunctivae/corneas clear. PERRL, EOM's intact. Fundi benign. Ears: normal TM's and external ear canals both ears Nose: Nares normal. Septum midline. Mucosa normal. No drainage or sinus tenderness. Neck: no adenopathy, no carotid bruit, no JVD, supple, symmetrical, trachea midline, and thyroid not enlarged, symmetric, no tenderness/mass/nodules Back: symmetric, no curvature. ROM normal. No CVA tenderness. Lungs: clear to auscultation bilaterally Chest wall: no tenderness Heart: regular rate and rhythm, S1, S2 normal, no murmur, click, rub or gallop Abdomen: soft, non-tender; bowel sounds normal; no masses,  no organomegaly Extremities: extremities normal, atraumatic, no cyanosis or edema Pulses: 2+ and symmetric Skin: Skin color, texture, turgor normal. No rashes or lesions Lymph nodes: Cervical, supraclavicular, and axillary nodes normal. Neurologic: Alert and oriented X 3, normal strength and tone. Normal symmetric reflexes. Normal coordination and gait    Assessment/ Plan: Garrett Clark here for annual physical exam.  Garrett Clark was seen today for hypertension.  Diagnoses and all orders for this visit: Annual physical exam    Essential hypertension BP goal - < 130/80 Explained that having normal blood pressure is the  goal and medications are helping to get to goal and maintain normal blood pressure. DIET: Limit salt intake, read nutrition labels to check salt content, limit fried and high fatty foods  Avoid using multisymptom OTC cold preparations that generally contain sudafed which can rise BP. Consult with pharmacist on best cold relief products to use for persons with HTN EXERCISE Discussed incorporating exercise such as walking - 30 minutes most days of the week and can do in 10 minute intervals    -     hydrALAZINE (APRESOLINE) tablet 25 mg  Dysuria -     GC/Chlamydia Probe Amp([facility]) -     Urine cytology ancillary only    Counseled on healthy lifestyle choices, including diet (rich in fruits, vegetables and lean meats and low in salt and simple carbohydrates) and exercise (at least 30 minutes of moderate physical activity daily).  Patient to follow up in 1 year for annual exam or sooner if  needed.  The above assessment and management plan was discussed with the patient. The patient verbalized understanding of and has agreed to the management plan. Patient is aware to call the clinic if symptoms persist or worsen. Patient is aware when to return to the clinic for a follow-up visit. Patient educated on when it is appropriate to go to the emergency department.   This note has been created with Education officer, environmental. Any transcriptional errors are unintentional.   Grayce Sessions, NP 04/05/2023, 12:45 AM

## 2023-04-07 ENCOUNTER — Telehealth (INDEPENDENT_AMBULATORY_CARE_PROVIDER_SITE_OTHER): Payer: Self-pay | Admitting: Primary Care

## 2023-04-07 NOTE — Telephone Encounter (Signed)
Pt says Marcelino Duster was supposed to send in an authorization for his medication cyclobenzaprine (FLEXERIL) 10 MG tablet . Pt says he has been without his medication for 3 weeks and needs it because his back has been hurting him.

## 2023-04-07 NOTE — Telephone Encounter (Signed)
Will forward to provider  

## 2023-04-08 ENCOUNTER — Encounter (INDEPENDENT_AMBULATORY_CARE_PROVIDER_SITE_OTHER): Payer: Self-pay | Admitting: Primary Care

## 2023-04-08 ENCOUNTER — Ambulatory Visit (INDEPENDENT_AMBULATORY_CARE_PROVIDER_SITE_OTHER): Payer: Medicaid Other | Admitting: Primary Care

## 2023-04-08 VITALS — BP 147/88 | HR 93 | Resp 16 | Wt 187.0 lb

## 2023-04-08 DIAGNOSIS — M79605 Pain in left leg: Secondary | ICD-10-CM

## 2023-04-08 DIAGNOSIS — R079 Chest pain, unspecified: Secondary | ICD-10-CM

## 2023-04-08 NOTE — Patient Instructions (Signed)
cyclobenzaprine (FLEXERIL) 10 MG tablet       Summary: TAKE 1 TABLET(10 MG) BY MOUTH THREE TIMES DAILY AS NEEDED FOR MUSCLE SPASMS, Normal Start: 09/27/2023Ord/Sold: 09/17/2022 (O)Ordered On: 09/27/2023Pharmacy: [id number] - [place], [place] - [address] Associated: Taking: Long-term: Med Note:      Patient not taking. Reported on 04/01/2023  Change Reorder Discontinue Patient Sig: TAKE 1 TABLET(10 MG) BY MOUTH THREE TIMES DAILY AS NEEDED FOR MUSCLE SPASMS Authorized By: Malen Gauze, MD Dispense: 90 tablet Refills: 1 ordered Dose History   Call orthopedics for pain meds

## 2023-04-08 NOTE — Telephone Encounter (Signed)
Patient seen today any pain in musculoskeletals call orthopedics

## 2023-04-08 NOTE — Progress Notes (Signed)
[facility]  Garrett Clark, is a 62 y.o. male  ZOX:[id number]  [id number]:[id number]  DOB - 1961/06/05  Chief Complaint  Patient presents with   Hypertension   Leg Pain    B/l   Back Pain       Subjective:   Garrett Clark is a 62 y.o. male here today for a follow up visit HTN. Patient has No headache, No chest pain, No abdominal pain - No Nausea, No new weakness tingling or numbness, No Cough - shortness of breath. His main concern is back and leg pain followed by orthopedic advised to call for medication for pain.  He also described having sharp chest pain yesterday that lasted a couple of hours denies shortness of breath or radiating factors. Today, he has a pain near groin and feels popping in his left hip- .  No problems updated.  No Known Allergies  Past Medical History:  Diagnosis Date   Cancer    Left kidney   Hyperlipidemia    Internal hemorrhoids    Polycystic liver disease    Substance abuse     Current Outpatient Medications on File Prior to Visit  Medication Sig Dispense Refill   atorvastatin (LIPITOR) 40 MG tablet TAKE 1 TABLET(40 MG) BY MOUTH DAILY 90 tablet 1   cyclobenzaprine (FLEXERIL) 10 MG tablet TAKE 1 TABLET(10 MG) BY MOUTH THREE TIMES DAILY AS NEEDED FOR MUSCLE SPASMS (Patient not taking: Reported on 04/01/2023) 90 tablet 1   fenofibrate (TRICOR) 48 MG tablet Take 1 tablet (48 mg total) by mouth daily. (Patient not taking: Reported on 04/01/2023) 90 tablet 1   gabapentin (NEURONTIN) 300 MG capsule Take 1 capsule  by mouth twice dailyTAKE 1 CAPSULE(300 MG) BY MOUTH TWICE DAILY 180 capsule 0   valsartan-hydrochlorothiazide (DIOVAN-HCT) 80-12.5 MG tablet Take 1 tablet by mouth daily. 90 tablet 3   No current facility-administered medications on file prior to visit.    Objective:   Vitals:   04/08/23 1045 04/08/23 1046  BP: (Abnormal) 159/84 (Abnormal) 147/88  Pulse: 93   Resp: 16   SpO2: 99%   Weight: 187 lb (84.8 kg)      Comprehensive ROS Pertinent positive and negative noted in HPI   Exam General appearance : Awake, alert, not in any distress. Speech Clear. Not toxic looking HEENT: Atraumatic and Normocephalic, pupils equally reactive to light and accomodation Neck: Supple, no JVD. No cervical lymphadenopathy.  Chest: Good air entry bilaterally, no added sounds  CVS: S1 S2 regular, no murmurs.  Abdomen: Bowel sounds present, Non tender and not distended with no gaurding, rigidity or rebound. Extremities: B/L Lower Ext shows no edema, both legs are warm to touch Neurology: Awake alert, and oriented X 3, CN II-XII intact, Non focal Skin: No Rash  Data Review No results found for: "HGBA1C"  Assessment & Plan  Garrett Clark was seen today for hypertension, leg pain and back pain.  Diagnoses and all orders for this visit:  Acute leg pain, left Refer to current orthopedic [facility]  Chest pain, unspecified type  described having sharp chest pain yesterday that lasted a couple of hours denies shortness of breath or radiating factors. EKG completed results were Sinus Rhythm WITHIN NORMAL LIMITS.  Discussed signs and symptoms of a heart attack and to take note chest pain occurs what he is doing, what he is eating, what motion he is performing monitor caffeine intake chocolate intake and alcohol and smoking  Patient have been counseled extensively about nutrition and exercise. Other  issues discussed during this visit include: low cholesterol diet, weight control and daily exercise, foot care, annual eye examinations at Ophthalmology, importance of adherence with medications and regular follow-up. We also discussed long term complications of uncontrolled diabetes and hypertension.   Return in about 3 months (around 07/08/2023).  The patient was given clear instructions to go to ER or return to medical center if symptoms don't improve, worsen or new problems develop. The patient verbalized understanding. The  patient was told to call to get lab results if they haven't heard anything in the next week.   This note has been created with Education officer, environmental. Any transcriptional errors are unintentional.   Grayce Sessions, NP 04/08/2023, 10:56 AM

## 2023-04-10 ENCOUNTER — Telehealth: Payer: Self-pay | Admitting: Orthopedic Surgery

## 2023-04-10 NOTE — Telephone Encounter (Signed)
Patient states he needs a refill on his Cyclobenzaprine . Please advise

## 2023-04-19 ENCOUNTER — Other Ambulatory Visit: Payer: Self-pay | Admitting: Surgical

## 2023-04-19 DIAGNOSIS — M5441 Lumbago with sciatica, right side: Secondary | ICD-10-CM

## 2023-04-19 MED ORDER — CYCLOBENZAPRINE HCL 10 MG PO TABS: ORAL_TABLET | ORAL | 1 refills | Status: DC

## 2023-04-19 NOTE — Telephone Encounter (Signed)
refilled 

## 2023-04-20 ENCOUNTER — Encounter: Payer: Self-pay | Admitting: Orthopedic Surgery

## 2023-04-20 ENCOUNTER — Ambulatory Visit (INDEPENDENT_AMBULATORY_CARE_PROVIDER_SITE_OTHER): Payer: Medicaid Other | Admitting: Orthopedic Surgery

## 2023-04-20 VITALS — BP 147/84 | HR 91 | Ht 69.0 in | Wt 187.0 lb

## 2023-04-20 DIAGNOSIS — M7661 Achilles tendinitis, right leg: Secondary | ICD-10-CM | POA: Diagnosis not present

## 2023-04-20 DIAGNOSIS — M7662 Achilles tendinitis, left leg: Secondary | ICD-10-CM

## 2023-04-20 MED ORDER — METHYLPREDNISOLONE 4 MG PO TBPK: ORAL_TABLET | ORAL | 0 refills | Status: DC

## 2023-04-20 NOTE — Progress Notes (Signed)
Orthopedic Surgery Progress Note   Assessment: Patient is a 62 y.o. male with bilateral Achilles tendonitis   Plan: -Patient has tried no specific treatment thus far -Medrol dosepak prescribed. Told him to take tylenol as well. Instructed him not to use NSAIDs. Can use Voltaren gel over the affected areas -Referral provided for PT -Follow up on an as needed basis  ___________________________________________________________________________  Subjective: Patient has had about 4 days of severe heel pain. He is more painful on the left side. There was no trauma or injury that brought on the pain. He has been unable to work due to the pain. The pain is worse with walking and improves if he sits or lays down. He has not had pain like this before. Denies paresthesias and numbness. Has been weight bearing in regular shoes.    Physical Exam:  General: no acute distress, appears stated age Neurologic: alert, answering questions appropriately, following commands Respiratory: unlabored breathing on room air, symmetric chest rise Psychiatric: appropriate affect, normal cadence to speech  MSK:   -Bilateral lower extremity  TTP over the Achilles insertion (L>R), no other tenderness to palpation over the foot ankle including over the plantar fascia  Negative thompson EHL/TA/GSC intact Plantarflexes and dorsiflexes toes Sensation intact to light touch in sural, saphenous, tibial, deep peroneal, and superficial peroneal nerve distributions Foot warm and well perfused, palpable DP pulse  Imaging: None obtained at today's visit  Patient name: Garrett Clark Patient MRN: [id number] Date: 04/20/23

## 2023-05-04 ENCOUNTER — Telehealth: Payer: Self-pay | Admitting: Primary Care

## 2023-05-04 ENCOUNTER — Ambulatory Visit (INDEPENDENT_AMBULATORY_CARE_PROVIDER_SITE_OTHER): Payer: Medicaid Other | Admitting: Primary Care

## 2023-05-04 NOTE — Telephone Encounter (Signed)
Please contact pt and let him know that he didn't need to be seen. He will need to come to July appt

## 2023-05-04 NOTE — Telephone Encounter (Signed)
Copied from CRM [id number]. Topic: General - Other >> May 04, 2023 12:59 PM Ja-Kwan M wrote: Reason for CRM: Pt would like to ask Marcelino Duster if he really needs to come in for the appt today since he saw her last month. Pt requests call back asap. Cb# [phone]

## 2023-05-05 ENCOUNTER — Other Ambulatory Visit (INDEPENDENT_AMBULATORY_CARE_PROVIDER_SITE_OTHER): Payer: Self-pay | Admitting: Primary Care

## 2023-05-05 DIAGNOSIS — M79601 Pain in right arm: Secondary | ICD-10-CM

## 2023-05-05 NOTE — Telephone Encounter (Signed)
Requested Prescriptions  Pending Prescriptions Disp Refills   gabapentin (NEURONTIN) 300 MG capsule [Pharmacy Med Name: GABAPENTIN 300MG  CAPSULES] 180 capsule 0    Sig: TAKE 1 CAPSULE BY MOUTH TWICE DAILY     Neurology: Anticonvulsants - gabapentin Failed - 05/05/2023 10:58 AM      Failed - Cr in normal range and within 360 days    Creat  Date Value Ref Range Status  01/30/2020 1.65 (H) 0.70 - 1.33 mg/dL Final    Comment:    For patients >60 years of age, the reference limit for Creatinine is approximately 13% higher for people identified as African-American. .    Creatinine, Ser  Date Value Ref Range Status  08/21/2022 1.51 (H) 0.76 - 1.27 mg/dL Final         Passed - Completed PHQ-2 or PHQ-9 in the last 360 days      Passed - Valid encounter within last 12 months    Recent Outpatient Visits           3 weeks ago Acute leg pain, left   [place] [facility] Grayce Sessions, NP   1 month ago Essential hypertension   [place] [facility] Grayce Sessions, NP   3 months ago Essential hypertension   [place] [facility] Grayce Sessions, NP   4 months ago Chronic right-sided low back pain without sciatica   [place] [facility] Grayce Sessions, NP   9 months ago Blood pressure check   [place] [facility] Grayce Sessions, NP       Future Appointments             In 2 months Randa Evens, Kinnie Scales, NP  [facility]

## 2023-05-11 ENCOUNTER — Telehealth (INDEPENDENT_AMBULATORY_CARE_PROVIDER_SITE_OTHER): Payer: Self-pay | Admitting: Primary Care

## 2023-05-11 NOTE — Telephone Encounter (Signed)
Spoke with pt and he confirmed he would come for apt in July

## 2023-05-15 ENCOUNTER — Telehealth: Payer: Self-pay | Admitting: Orthopedic Surgery

## 2023-05-15 ENCOUNTER — Telehealth: Payer: Self-pay | Admitting: Primary Care

## 2023-05-15 NOTE — Telephone Encounter (Signed)
Copied from CRM [id number]. Topic: General - Other >> May 15, 2023 12:40 PM [place] B wrote: Reason for CRM: pt called in asking to speak with Dr Randa Evens or her nurse. He wouldn't give details about why

## 2023-05-15 NOTE — Telephone Encounter (Signed)
FYI------I called and spoke with patient he states that the last several times he has attempted intercourse with his girlfriend he has not been able to finish. He wanted to find if it is related to the medications he is taking? I spoke with Dr. Christell Constant, he advised that this is not a known side effect of his medications, however he should contact his PCP. I advised patient of this and he states that he has called them but had not heard anything back yet. I advised that they hopefully call him back this afternoon.

## 2023-05-15 NOTE — Telephone Encounter (Signed)
Patient would like a call back from nurse stated it was personal please advise

## 2023-05-19 ENCOUNTER — Telehealth (INDEPENDENT_AMBULATORY_CARE_PROVIDER_SITE_OTHER): Payer: Self-pay | Admitting: Primary Care

## 2023-05-19 NOTE — Telephone Encounter (Signed)
Pt returned call and stated he is requesting to speak with Marcelino Duster I asked if there was anything I could help him with pt stated no he would rather speak to [place]. Made pt aware that provider is seeing pt's but I will send this message to her requesting a call

## 2023-05-19 NOTE — Telephone Encounter (Signed)
Spoke to pt. He will be at July apt

## 2023-05-19 NOTE — Telephone Encounter (Signed)
Returned pt call pt didn't answer and was unable to lvm due to vm being full  

## 2023-05-20 NOTE — Telephone Encounter (Signed)
Patient concern that since his Bp meds was changed his sex life has changed. Asked would you rather have a stroke or heart attack having a orgasm from stopping your Bp meds. Patient responded no he would cont. Medication. Also, of Bp is control on meds will discuss ED meds.

## 2023-06-04 ENCOUNTER — Other Ambulatory Visit (INDEPENDENT_AMBULATORY_CARE_PROVIDER_SITE_OTHER): Payer: Self-pay | Admitting: Primary Care

## 2023-06-05 ENCOUNTER — Other Ambulatory Visit (INDEPENDENT_AMBULATORY_CARE_PROVIDER_SITE_OTHER): Payer: Self-pay | Admitting: Primary Care

## 2023-06-05 MED ORDER — ATORVASTATIN CALCIUM 40 MG PO TABS: 40.0000 mg | ORAL_TABLET | Freq: Every day | ORAL | 0 refills | Status: DC

## 2023-06-05 NOTE — Telephone Encounter (Signed)
Will forward to provider  

## 2023-06-17 ENCOUNTER — Other Ambulatory Visit (INDEPENDENT_AMBULATORY_CARE_PROVIDER_SITE_OTHER): Payer: Self-pay

## 2023-06-17 ENCOUNTER — Other Ambulatory Visit (INDEPENDENT_AMBULATORY_CARE_PROVIDER_SITE_OTHER): Payer: Self-pay | Admitting: Primary Care

## 2023-06-17 MED ORDER — ATORVASTATIN CALCIUM 40 MG PO TABS: 40.0000 mg | ORAL_TABLET | Freq: Every day | ORAL | 0 refills | Status: DC

## 2023-07-06 ENCOUNTER — Ambulatory Visit (INDEPENDENT_AMBULATORY_CARE_PROVIDER_SITE_OTHER): Payer: Medicaid Other | Admitting: Primary Care

## 2023-07-06 ENCOUNTER — Encounter (INDEPENDENT_AMBULATORY_CARE_PROVIDER_SITE_OTHER): Payer: Self-pay | Admitting: Primary Care

## 2023-07-06 VITALS — BP 110/63 | HR 85 | Resp 16 | Wt 202.0 lb

## 2023-07-06 DIAGNOSIS — E663 Overweight: Secondary | ICD-10-CM | POA: Diagnosis not present

## 2023-07-06 DIAGNOSIS — E782 Mixed hyperlipidemia: Secondary | ICD-10-CM

## 2023-07-06 DIAGNOSIS — I10 Essential (primary) hypertension: Secondary | ICD-10-CM

## 2023-07-06 DIAGNOSIS — N189 Chronic kidney disease, unspecified: Secondary | ICD-10-CM | POA: Diagnosis not present

## 2023-07-06 NOTE — Progress Notes (Signed)
[facility]   [name] is a 62 y.o. overweight male presents for hypertension evaluation, has right kidney only. Denies shortness of breath, headaches, chest pain or lower extremity edema, sudden onset, vision changes, unilateral weakness, dizziness, paresthesias   Patient reports adherence with medications.  Dietary habits include: decreased sodium intake , not reading labels Exercise habits include:walk Family / Social history: DM- GM fraternal and MI-father and GF    Past Medical History:  Diagnosis Date   Cancer (HCC)    Left kidney   Hyperlipidemia    Internal hemorrhoids    Polycystic liver disease    Substance abuse (HCC)    Past Surgical History:  Procedure Laterality Date   COLONOSCOPY     HEMORRHOID BANDING     ROBOT ASSISTED LAPAROSCOPIC NEPHRECTOMY Left 12/07/2017   Procedure: XI ROBOTIC ASSISTED LAPAROSCOPIC NEPHRECTOMY;  Surgeon: Malen Gauze, MD;  Location: [facility];  Service: Urology;  Laterality: Left;   No Known Allergies Current Outpatient Medications on File Prior to Visit  Medication Sig Dispense Refill   atorvastatin (LIPITOR) 40 MG tablet Take 1 tablet (40 mg total) by mouth daily. 90 tablet 0   cyclobenzaprine (FLEXERIL) 10 MG tablet TAKE 1 TABLET(10 MG) BY MOUTH THREE TIMES DAILY AS NEEDED FOR MUSCLE SPASMS 90 tablet 1   fenofibrate (TRICOR) 48 MG tablet Take 1 tablet (48 mg total) by mouth daily. (Patient not taking: Reported on 04/01/2023) 90 tablet 1   gabapentin (NEURONTIN) 300 MG capsule TAKE 1 CAPSULE BY MOUTH TWICE DAILY 180 capsule 0   methylPREDNISolone (MEDROL DOSEPAK) 4 MG TBPK tablet Take as prescribed on the box (Patient not taking: Reported on 07/06/2023) 21 tablet 0   valsartan-hydrochlorothiazide (DIOVAN-HCT) 80-12.5 MG tablet Take 1 tablet by mouth daily. 90 tablet 3   No current facility-administered medications on file prior to visit.   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  Has 2 children.  Works at [employer].  Education: high school.    Social Determinants of Health   Financial Resource Strain: Not on file  Food Insecurity: Not on file  Transportation Needs: Not on file  Physical Activity: Not on file  Stress: Not on file  Social Connections: Not on file  Intimate Partner Violence: Not on file   Family History  Problem Relation Age of Onset   Diabetes Mother    Hypertension Mother    Alcohol abuse Mother    Diabetes Father    Kidney disease Father    Diabetes  Sister    Stroke Sister    Diabetes Paternal Grandmother    Cancer Paternal Grandfather        type unknown   Colon cancer Neg Hx      OBJECTIVE:  Vitals:   07/06/23 1350  BP: 110/63  Pulse: 85  Resp: 16  SpO2: 100%  Weight: 202 lb (91.6 kg)    Physical Exam Vitals reviewed.  Constitutional:      Appearance: Normal appearance.  HENT:     Head: Normocephalic.     Right Ear: Tympanic membrane and external ear normal.     Left Ear: Tympanic membrane and external ear normal.     Nose: Nose normal.  Eyes:     Extraocular Movements: Extraocular movements intact.     Pupils: Pupils are equal, round, and reactive to light.   Cardiovascular:     Rate and Rhythm: Normal rate and regular rhythm.  Pulmonary:     Effort: Pulmonary effort is normal.     Breath sounds: Normal breath sounds.  Abdominal:     General: Bowel sounds are normal. There is distension.     Palpations: Abdomen is soft.  Musculoskeletal:        General: Normal range of motion.     Cervical back: Normal range of motion.  Skin:    General: Skin is warm and dry.  Neurological:     Mental Status: He is alert and oriented to person, place, and time.  Psychiatric:        Mood and Affect: Mood normal.        Behavior: Behavior normal.    ROS  Last 3 Office BP readings: BP Readings from Last 3 Encounters:  07/06/23 110/63  04/20/23 (Abnormal) 147/84  04/08/23 (Abnormal) 147/88    BMET    Component Value Date/Time   NA 140 08/21/2022 1357   K 4.6 08/21/2022 1357   CL 102 08/21/2022 1357   CO2 23 08/21/2022 1357   GLUCOSE 88 08/21/2022 1357   GLUCOSE 113 (H) 11/15/2021 0518   BUN 11 08/21/2022 1357   CREATININE 1.51 (H) 08/21/2022 1357   CREATININE 1.65 (H) 01/30/2020 1410   CALCIUM 9.4 08/21/2022 1357   GFRNONAA 35 (L) 11/15/2021 0518   GFRAA 50 (L) 08/23/2020 1213    Renal function: CrCl cannot be calculated (Patient's most recent lab result is older than the maximum 21 days allowed.).  Clinical ASCVD: Yes  The 10-year ASCVD risk score (Arnett DK, et al., 2019) is: 11%   Values used to calculate the score:     Age: 35 years     Sex: Male     Is Non-Hispanic African American: Yes     Diabetic: No     Tobacco smoker: No     Systolic Blood Pressure: 110 mmHg     Is BP treated: Yes     HDL Cholesterol: 32 mg/dL     Total Cholesterol: 164 mg/dL  ASCVD risk factors include- [place]   ASSESSMENT & PLAN: Garrett Clark "Garrett Clark" was seen today for hypertension.  Diagnoses and all orders for this visit:  Chronic kidney disease, unspecified CKD stage CMP   Overweight (BMI 25.0-29.9) Discussed diet and exercise for person with  BMI >25. Instructed: You must burn more calories than you eat. Losing 5 percent of your body weight should be considered a success. In the longer term, losing more than 15 percent of your body weight and staying at this weight is an extremely good result. However,  keep in mind that even losing 5 percent of your body weight leads to important health benefits, so try not to get discouraged if you're not able to lose more than this. Will recheck weight in 3-6 months.   Mixed hyperlipidemia On statin and fenofibrate Ck on f/u   Essential hypertension UNREMARKABLE -Counseled on lifestyle modifications for blood pressure control including reduced dietary sodium, increased exercise, weight reduction and adequate sleep. Also, educated patient about the risk for cardiovascular events, stroke and heart attack. Also counseled patient about the importance of medication adherence. If you participate in smoking, it is important to stop using tobacco as this will increase the risks associated with uncontrolled blood pressure.   Goal BP:  For patients younger than 60: Goal BP < 130/80. For patients 60 and older: Goal BP < 140/90. For patients with diabetes: Goal BP < 130/80. Your most recent BP: 110/63  Minimize salt intake. Minimize alcohol intake    This note has been created with Education officer, environmental. Any transcriptional errors are unintentional.   Grayce Sessions, NP 07/06/2023, 2:04 PM

## 2023-07-07 ENCOUNTER — Ambulatory Visit (INDEPENDENT_AMBULATORY_CARE_PROVIDER_SITE_OTHER): Payer: Medicaid Other

## 2023-07-07 ENCOUNTER — Other Ambulatory Visit (INDEPENDENT_AMBULATORY_CARE_PROVIDER_SITE_OTHER): Payer: Self-pay

## 2023-07-07 DIAGNOSIS — E782 Mixed hyperlipidemia: Secondary | ICD-10-CM

## 2023-07-07 DIAGNOSIS — I10 Essential (primary) hypertension: Secondary | ICD-10-CM | POA: Diagnosis not present

## 2023-07-07 DIAGNOSIS — N189 Chronic kidney disease, unspecified: Secondary | ICD-10-CM | POA: Diagnosis not present

## 2023-07-08 LAB — CMP14+EGFR
ALT: 23 IU/L (ref 0–44)
AST: 25 IU/L (ref 0–40)
Albumin: 4.2 g/dL (ref 3.8–4.9)
Alkaline Phosphatase: 118 IU/L (ref 44–121)
BUN/Creatinine Ratio: 13 (ref 10–24)
BUN: 22 mg/dL (ref 8–27)
Bilirubin Total: 0.6 mg/dL (ref 0.0–1.2)
CO2: 25 mmol/L (ref 20–29)
Calcium: 9.1 mg/dL (ref 8.6–10.2)
Chloride: 101 mmol/L (ref 96–106)
Creatinine, Ser: 1.75 mg/dL — ABNORMAL HIGH (ref 0.76–1.27)
Globulin, Total: 3.5 g/dL (ref 1.5–4.5)
Glucose: 90 mg/dL (ref 70–99)
Potassium: 4.8 mmol/L (ref 3.5–5.2)
Sodium: 140 mmol/L (ref 134–144)
Total Protein: 7.7 g/dL (ref 6.0–8.5)
eGFR: 44 mL/min/{1.73_m2} — ABNORMAL LOW (ref >59)

## 2023-07-15 ENCOUNTER — Ambulatory Visit (INDEPENDENT_AMBULATORY_CARE_PROVIDER_SITE_OTHER): Payer: Self-pay | Admitting: *Deleted

## 2023-07-15 ENCOUNTER — Telehealth: Payer: Self-pay

## 2023-07-15 NOTE — Telephone Encounter (Signed)
  Chief Complaint: Wanting to know why his kidney function is declining since he only has one kidney.   He is going to call Dr. Wilkie Aye, his kidney doctor and make him aware of his GFR results too. Symptoms: tired and sleepy Frequency: daily Pertinent Negatives: Patient denies N/A Disposition: [] ED /[] Urgent Care (no appt availability in office) / [] Appointment(In office/virtual)/ []  [facility]/ [x] Home Care/ [] Refused Recommended Disposition /[] [facility]/ []  Follow-up with PCP Additional Notes: FYI for Gwinda Passe, NP that he is going to call his kidney doctor.

## 2023-07-15 NOTE — Telephone Encounter (Signed)
Noted  

## 2023-07-15 NOTE — Telephone Encounter (Signed)
Reason for Disposition  General information question, no triage required and triager able to answer question    He was questioning his kidney function test and how his kidney was doing.  Answer Assessment - Initial Assessment Questions 1. REASON FOR CALL or QUESTION: "What is your reason for calling today?" or "How can I best help you?" or "What question do you have that I can help answer?"     He called in questioning his kidney function.   "She did a kidney test to see how my kidneys are doing". I looked at this labs from Gwinda Passe, NP and read the message to him about his GFR declining.   It was 44 now.   He is wanting to know why his kidney is declining.   "I only have one kidney".   I let him know I would send a message to Gwinda Passe, NP with his question.   He said is is tired and sleepy all the time.   "I'm going to call my kidney doctor.    I haven't seen him in over a year because he said I was doing better.    I'll call Dr. Wilkie Aye.   I let him know if that's who he sees for his kidney it was not a bad idea to let him know.    He thanked me for helping him and is going to call [facility].   He did my other kidney surgery.  I did send a message to Gwinda Passe, NP for her information.  Protocols used: Information Only Call - No Triage-A-AH

## 2023-07-15 NOTE — Telephone Encounter (Signed)
Patient left a voice message 07-15-2023  Needing to discuss kidney count is dropping.  Please advise.  Call:  [phone]

## 2023-07-16 NOTE — Telephone Encounter (Signed)
Returned call with no answer and no way to leave voicemail.

## 2023-07-26 ENCOUNTER — Other Ambulatory Visit (INDEPENDENT_AMBULATORY_CARE_PROVIDER_SITE_OTHER): Payer: Self-pay | Admitting: Primary Care

## 2023-07-26 DIAGNOSIS — I10 Essential (primary) hypertension: Secondary | ICD-10-CM

## 2023-08-04 ENCOUNTER — Ambulatory Visit: Payer: Medicaid Other | Admitting: Urology

## 2023-08-10 ENCOUNTER — Ambulatory Visit: Payer: Medicaid Other | Admitting: Urology

## 2023-08-10 ENCOUNTER — Encounter: Payer: Self-pay | Admitting: Urology

## 2023-08-10 VITALS — BP 101/69 | HR 97 | Temp 97.7°F

## 2023-08-10 DIAGNOSIS — N183 Chronic kidney disease, stage 3 unspecified: Secondary | ICD-10-CM | POA: Diagnosis not present

## 2023-08-10 DIAGNOSIS — Z905 Acquired absence of kidney: Secondary | ICD-10-CM

## 2023-08-10 DIAGNOSIS — C642 Malignant neoplasm of left kidney, except renal pelvis: Secondary | ICD-10-CM | POA: Diagnosis not present

## 2023-08-10 LAB — URINALYSIS, ROUTINE W REFLEX MICROSCOPIC
Bilirubin, UA: NEGATIVE
Glucose, UA: NEGATIVE
Leukocytes,UA: NEGATIVE
Nitrite, UA: NEGATIVE
Protein,UA: NEGATIVE
RBC, UA: NEGATIVE
Specific Gravity, UA: 1.025 (ref 1.005–1.030)
Urobilinogen, Ur: 0.2 mg/dL (ref 0.2–1.0)
pH, UA: 6 (ref 5.0–7.5)

## 2023-08-10 NOTE — Patient Instructions (Signed)
Please call [phone] to schedule your renal ultrasound.

## 2023-08-10 NOTE — Progress Notes (Unsigned)
Name: Garrett Clark DOB: 1961/01/08 MRN: [id number]  History of Present Illness: [name] is a 62 y.o. male who presents today for follow up visit at [facility] . - GU history: 1. Left renal cell carcinoma. Underwent left radical nephrectomy on 12/07/2017 by Dr. Ronne Binning.   At last visit with Dr. Ronne Binning on 08/29/2022: Doing well. Per note: "Patient is 5 years after left radical nephrectomy. He can followup prn".  Relevant results: - 11/12/2021: GFR 35, creatinine 2.16 (ER visit for right flank pain; CT unremarkable; possible dehydration) - 08/21/2022: GFR 53, creatinine 1.51  Since last visit: 07/06/2023: Seen by PCP; discussed CKD and other comorbidities. Patient reports that his GFR was 44; unable to view those results per today's chart review.   Today: He reports concern about his renal function based on lab results.   He denies increased urinary urgency, frequency, nocturia, dysuria, gross hematuria, hesitancy, straining to void, or sensations of incomplete emptying. Reports occasional flank pain but relates that to his physical job.    Fall Screening: Do you usually have a device to assist in your mobility? No   Medications: Current Outpatient Medications  Medication Sig Dispense Refill   atorvastatin (LIPITOR) 40 MG tablet Take 1 tablet (40 mg total) by mouth daily. 90 tablet 0   cyclobenzaprine (FLEXERIL) 10 MG tablet TAKE 1 TABLET(10 MG) BY MOUTH THREE TIMES DAILY AS NEEDED FOR MUSCLE SPASMS 90 tablet 1   gabapentin (NEURONTIN) 300 MG capsule TAKE 1 CAPSULE BY MOUTH TWICE DAILY 180 capsule 0   valsartan-hydrochlorothiazide (DIOVAN-HCT) 80-12.5 MG tablet Take 1 tablet by mouth daily. 90 tablet 3   No current facility-administered medications for this visit.    Allergies: No Known Allergies  Past Medical History:  Diagnosis Date   Cancer (HCC)    Left kidney   Hyperlipidemia    Internal hemorrhoids    Polycystic liver disease    Substance  abuse (HCC)    Past Surgical History:  Procedure Laterality Date   COLONOSCOPY     HEMORRHOID BANDING     ROBOT ASSISTED LAPAROSCOPIC NEPHRECTOMY Left 12/07/2017   Procedure: XI ROBOTIC ASSISTED LAPAROSCOPIC NEPHRECTOMY;  Surgeon: Malen Gauze, MD;  Location: [facility];  Service: Urology;  Laterality: Left;   Family History  Problem Relation Age of Onset   Diabetes Mother    Hypertension Mother    Alcohol abuse Mother    Diabetes Father    Kidney disease Father    Diabetes Sister    Stroke Sister    Diabetes Paternal Grandmother    Cancer Paternal Grandfather        type unknown   Colon cancer Neg Hx    Social History   Socioeconomic History   Marital status: Single    Spouse name: Not on file   Number of children: 2   Years of education: 12   Highest education level: Not on file  Occupational History    Employer: [employer]  Tobacco Use   Smoking status: Former    Current packs/day: 0.00    Types: Cigarettes    Quit date: 12/03/2012    Years since quitting: 10.6   Smokeless tobacco: Never  Vaping Use   Vaping status: Never Used  Substance and Sexual Activity   Alcohol use: Not Currently    Comment: Occasional   Drug use: Yes    Types: Marijuana    Comment: 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  Has 2 children.  Works at [employer].  Education: high school.    Social Determinants of Health   Financial Resource Strain: Not on file  Food Insecurity: Not on file  Transportation Needs: Not on file  Physical Activity: Not on file  Stress: Not on file  Social Connections: Not on file  Intimate Partner Violence: Not on file    Review of Systems Constitutional: Patient denies any unintentional weight loss or change in strength lntegumentary: Patient  denies any rashes or pruritus Cardiovascular: Patient denies chest pain or syncope Respiratory: Patient denies shortness of breath Gastrointestinal: Patient denies nausea, vomiting, constipation, or diarrhea Musculoskeletal: Patient denies muscle cramps or weakness Neurologic: Patient denies convulsions or seizures Psychiatric: Patient denies memory problems Allergic/Immunologic: Patient denies recent allergic reaction(s) Hematologic/Lymphatic: Patient denies bleeding tendencies Endocrine: Patient denies heat/cold intolerance  GU: As per HPI.  OBJECTIVE Vitals:   08/10/23 1152  BP: 101/69  Pulse: 97  Temp: 97.7 F (36.5 C)   There is no height or weight on file to calculate BMI.  Physical Examination  Constitutional: No obvious distress; patient is non-toxic appearing  Cardiovascular: No visible lower extremity edema.  Respiratory: The patient does not have audible wheezing/stridor; respirations do not appear labored  Gastrointestinal: Abdomen non-distended Musculoskeletal: Normal ROM of UEs  Skin: No obvious rashes/open sores  Neurologic: CN 2-12 grossly intact Psychiatric: Answered questions appropriately with normal affect  Hematologic/Lymphatic/Immunologic: No obvious bruises or sites of spontaneous bleeding  UA: negative   ASSESSMENT Renal cell carcinoma of left kidney (HCC) - Plan: Urinalysis, Routine w reflex microscopic, US RENAL, Ambulatory referral to Nephrology  History of left radical nephrectomy - Plan: US RENAL, Ambulatory referral to Nephrology  Stage 3 chronic kidney disease, unspecified whether stage 3a or 3b CKD (HCC) - Plan: US RENAL, Ambulatory referral to Nephrology  Low suspicion for malignancy. We agreed to obtain renal US for surveillance and he was advised to establish care with nephrology for suspected medical renal disease (CKD). We agreed to follow up on an as-needed basis. Pt verbalized understanding and agreement. All questions were  answered.  PLAN Advised the following: 1. RUS. 2. Referral placed to Nephrology. 3. Urology: Return for PRN unless otherwise indicated.  Orders Placed This Encounter  Procedures   US RENAL    Standing Status:   Future    Standing Expiration Date:   08/09/2024    Order Specific Question:   Reason for Exam (SYMPTOM  OR DIAGNOSIS REQUIRED)    Answer:   kidney stone known or suspected    Order Specific Question:   Preferred imaging location?    Answer:   [facility]   Urinalysis, Routine w reflex microscopic   Ambulatory referral to Nephrology    Referral Priority:   Routine    Referral Type:   Consultation    Referral Reason:   Specialty Services Required    Requested Specialty:   Nephrology    Number of Visits Requested:   1    It has been explained that the patient is to follow regularly with their PCP in addition to all other providers involved in their care and to follow instructions provided by these respective offices. Patient advised to contact urology clinic if any urologic-pertaining questions, concerns, new symptoms or  problems arise in the interim period.  Patient Instructions  Please call [phone] to schedule your renal ultrasound.  Electronically signed by:  Donnita Falls, FNP   08/10/23    12:33 PM

## 2023-08-13 ENCOUNTER — Ambulatory Visit (HOSPITAL_COMMUNITY)
Admission: RE | Admit: 2023-08-13 | Discharge: 2023-08-13 | Disposition: A | Discharge status: Home / Self Care | Payer: Medicaid Other | Source: Ambulatory Visit | Attending: Urology | Admitting: Urology

## 2023-08-13 DIAGNOSIS — Z905 Acquired absence of kidney: Secondary | ICD-10-CM | POA: Diagnosis not present

## 2023-08-13 DIAGNOSIS — N183 Chronic kidney disease, stage 3 unspecified: Secondary | ICD-10-CM | POA: Diagnosis present

## 2023-08-13 DIAGNOSIS — C642 Malignant neoplasm of left kidney, except renal pelvis: Secondary | ICD-10-CM | POA: Insufficient documentation

## 2023-08-31 DIAGNOSIS — N1832 Chronic kidney disease, stage 3b: Secondary | ICD-10-CM | POA: Diagnosis not present

## 2023-08-31 DIAGNOSIS — I129 Hypertensive chronic kidney disease with stage 1 through stage 4 chronic kidney disease, or unspecified chronic kidney disease: Secondary | ICD-10-CM | POA: Diagnosis not present

## 2023-08-31 DIAGNOSIS — E785 Hyperlipidemia, unspecified: Secondary | ICD-10-CM | POA: Diagnosis not present

## 2023-08-31 DIAGNOSIS — R809 Proteinuria, unspecified: Secondary | ICD-10-CM | POA: Diagnosis not present

## 2023-08-31 DIAGNOSIS — R8290 Unspecified abnormal findings in urine: Secondary | ICD-10-CM | POA: Diagnosis not present

## 2023-10-02 DIAGNOSIS — N1832 Chronic kidney disease, stage 3b: Secondary | ICD-10-CM | POA: Diagnosis not present

## 2023-10-02 DIAGNOSIS — I129 Hypertensive chronic kidney disease with stage 1 through stage 4 chronic kidney disease, or unspecified chronic kidney disease: Secondary | ICD-10-CM | POA: Diagnosis not present

## 2023-10-02 DIAGNOSIS — R809 Proteinuria, unspecified: Secondary | ICD-10-CM | POA: Diagnosis not present

## 2023-10-02 DIAGNOSIS — R8290 Unspecified abnormal findings in urine: Secondary | ICD-10-CM | POA: Diagnosis not present

## 2023-10-02 DIAGNOSIS — E785 Hyperlipidemia, unspecified: Secondary | ICD-10-CM | POA: Diagnosis not present

## 2023-10-05 DIAGNOSIS — E785 Hyperlipidemia, unspecified: Secondary | ICD-10-CM | POA: Diagnosis not present

## 2023-10-05 DIAGNOSIS — I129 Hypertensive chronic kidney disease with stage 1 through stage 4 chronic kidney disease, or unspecified chronic kidney disease: Secondary | ICD-10-CM | POA: Diagnosis not present

## 2023-10-05 DIAGNOSIS — R809 Proteinuria, unspecified: Secondary | ICD-10-CM | POA: Diagnosis not present

## 2023-10-05 DIAGNOSIS — N1832 Chronic kidney disease, stage 3b: Secondary | ICD-10-CM | POA: Diagnosis not present

## 2023-10-06 ENCOUNTER — Ambulatory Visit (INDEPENDENT_AMBULATORY_CARE_PROVIDER_SITE_OTHER): Payer: Medicaid Other | Admitting: Primary Care

## 2023-10-07 ENCOUNTER — Other Ambulatory Visit: Payer: Self-pay | Admitting: Surgical

## 2023-10-07 DIAGNOSIS — M5441 Lumbago with sciatica, right side: Secondary | ICD-10-CM

## 2023-10-13 ENCOUNTER — Other Ambulatory Visit (INDEPENDENT_AMBULATORY_CARE_PROVIDER_SITE_OTHER): Payer: Self-pay | Admitting: Primary Care

## 2023-10-13 DIAGNOSIS — M5441 Lumbago with sciatica, right side: Secondary | ICD-10-CM

## 2023-10-13 NOTE — Telephone Encounter (Unsigned)
Copied from CRM [id number]. Topic: General - Other >> Oct 13, 2023 10:48 AM Everette C wrote: Reason for CRM: Medication Refill - Medication: cyclobenzaprine (FLEXERIL) 10 MG tablet [[id number]]  Has the patient contacted their pharmacy? Yes.   (Agent: If no, request that the patient contact the pharmacy for the refill. If patient does not wish to contact the pharmacy document the reason why and proceed with request.) (Agent: If yes, when and what did the pharmacy advise?)  Preferred Pharmacy (with phone number or street name): [facility] [phone] - [place], [place] - [address] AT [facility] [address] [place] [address] Phone: [phone] Fax: [phone] Hours: Not open 24 hours   Has the patient been seen for an appointment in the last year OR does the patient have an upcoming appointment? Yes.    Agent: Please be advised that RX refills may take up to 3 business days. We ask that you follow-up with your pharmacy.

## 2023-10-14 NOTE — Telephone Encounter (Signed)
Requested medication (s) are due for refill today: No  Requested medication (s) are on the active medication list: Yes  Last refill:  10/07/23  Future visit scheduled: Yes  Notes to clinic:  Last filled by Dr. Simona Huh. Not delegated.    Requested Prescriptions  Pending Prescriptions Disp Refills   cyclobenzaprine (FLEXERIL) 10 MG tablet 90 tablet 1    Sig: TAKE 1 TABLET(10 MG) BY MOUTH THREE TIMES DAILY AS NEEDED FOR MUSCLE SPASMS     Not Delegated - Analgesics:  Muscle Relaxants Failed - 10/13/2023 11:32 AM      Failed - This refill cannot be delegated      Passed - Valid encounter within last 6 months    Recent Outpatient Visits           3 months ago Chronic kidney disease, unspecified CKD stage   [place] [facility] Grayce Sessions, NP   6 months ago Acute leg pain, left   [place] [facility] Grayce Sessions, NP   6 months ago Essential hypertension   [place] [facility] Grayce Sessions, NP   8 months ago Essential hypertension   [place] [facility] Grayce Sessions, NP   10 months ago Chronic right-sided low back pain without sciatica   [place] [facility] Grayce Sessions, NP       Future Appointments             In 6 days Randa Evens, Kinnie Scales, NP [place] [facility]

## 2023-10-15 ENCOUNTER — Other Ambulatory Visit (INDEPENDENT_AMBULATORY_CARE_PROVIDER_SITE_OTHER): Payer: Self-pay | Admitting: Primary Care

## 2023-10-15 NOTE — Telephone Encounter (Signed)
No recent cholesterol level. Will forward to provider

## 2023-10-19 ENCOUNTER — Telehealth (INDEPENDENT_AMBULATORY_CARE_PROVIDER_SITE_OTHER): Payer: Self-pay | Admitting: Primary Care

## 2023-10-19 NOTE — Telephone Encounter (Signed)
Spoke to pt. Will be at apt.  

## 2023-10-20 ENCOUNTER — Telehealth (INDEPENDENT_AMBULATORY_CARE_PROVIDER_SITE_OTHER): Payer: Medicaid Other | Admitting: Primary Care

## 2023-10-20 DIAGNOSIS — M79601 Pain in right arm: Secondary | ICD-10-CM

## 2023-10-20 DIAGNOSIS — R202 Paresthesia of skin: Secondary | ICD-10-CM

## 2023-10-20 DIAGNOSIS — M79602 Pain in left arm: Secondary | ICD-10-CM | POA: Diagnosis not present

## 2023-10-20 DIAGNOSIS — E782 Mixed hyperlipidemia: Secondary | ICD-10-CM | POA: Diagnosis not present

## 2023-10-20 MED ORDER — GABAPENTIN 300 MG PO CAPS: ORAL_CAPSULE | ORAL | 0 refills | Status: DC

## 2023-10-21 ENCOUNTER — Ambulatory Visit (INDEPENDENT_AMBULATORY_CARE_PROVIDER_SITE_OTHER): Payer: Medicaid Other

## 2023-10-21 DIAGNOSIS — E782 Mixed hyperlipidemia: Secondary | ICD-10-CM | POA: Diagnosis not present

## 2023-10-22 LAB — LIPID PANEL
Chol/HDL Ratio: 4.2 ratio (ref 0.0–5.0)
Cholesterol, Total: 135 mg/dL (ref 100–199)
HDL: 32 mg/dL — ABNORMAL LOW (ref >39)
LDL Chol Calc (NIH): 87 mg/dL (ref 0–99)
Triglycerides: 84 mg/dL (ref 0–149)
VLDL Cholesterol Cal: 16 mg/dL (ref 5–40)

## 2023-10-25 NOTE — Progress Notes (Addendum)
  [facility]  Virtual Visit  I connected with Garrett Clark, on 10/25/2023 at 9:42 PM through an audio and video application or by telephone and verified that I am speaking with the correct person using two identifiers.   Consent: I discussed the limitations, risks, security and privacy concerns of performing an evaluation and management service by telephone and the availability of in person appointments. I also discussed with the patient that there may be a patient responsible charge related to this service. The patient expressed understanding and agreed to proceed.   Location of Patient: Home  Location of Provider: [facility]   Persons participating in Telemedicine visit: Rien Patrice Exie Parody,  NP   History of Present Illness: Garrett Clark is a 62 year old male having a virtual visit he voices concerns about numbness and tingling and pain bilateral upper extremities.  He also has hyperlipidemia and then will come into the office for labs.   Past Medical History:  Diagnosis Date   Cancer (HCC)    Left kidney   Hyperlipidemia    Internal hemorrhoids    Polycystic liver disease    Substance abuse (HCC)    No Known Allergies  Current Outpatient Medications on File Prior to Visit  Medication Sig Dispense Refill   atorvastatin (LIPITOR) 40 MG tablet Take 1 tablet (40 mg total) by mouth daily. 90 tablet 0   cyclobenzaprine (FLEXERIL) 10 MG tablet TAKE 1 TABLET(10 MG) BY MOUTH THREE TIMES DAILY AS NEEDED FOR MUSCLE SPASMS 90 tablet 1   valsartan-hydrochlorothiazide (DIOVAN-HCT) 80-12.5 MG tablet Take 1 tablet by mouth daily. 90 tablet 3   No current facility-administered medications on file prior to visit.    Observations/Objective: See HPI  Assessment and Plan: Diagnoses and all orders for this visit:  Mixed hyperlipidemia -     Lipid panel; Future  Paresthesia and pain of both upper  extremities -     gabapentin (NEURONTIN) 300 MG capsule; TAKE 1 CAPSULE BY MOUTH TWICE DAILY    Follow Up Instructions: 6 weeks or earlier if peristasis does not improve on medication prescribed   I discussed the assessment and treatment plan with the patient. The patient was provided an opportunity to ask questions and all were answered. The patient agreed with the plan and demonstrated an understanding of the instructions.   The patient was advised to call back or seek an in-person evaluation if the symptoms worsen or if the condition fails to improve as anticipated.     I provided 20 minutes total of non-face-to-face time during this encounter including median intraservice time, reviewing previous notes, investigations, ordering medications, medical decision making, coordinating care and patient verbalized understanding at the end of the visit.    This note has been created with Education officer, environmental. Any transcriptional errors are unintentional.   Grayce Sessions, NP 10/25/2023, 9:42 PM

## 2023-11-01 ENCOUNTER — Other Ambulatory Visit (INDEPENDENT_AMBULATORY_CARE_PROVIDER_SITE_OTHER): Payer: Self-pay | Admitting: Primary Care

## 2023-11-01 MED ORDER — ATORVASTATIN CALCIUM 20 MG PO TABS: 20.0000 mg | ORAL_TABLET | Freq: Every day | ORAL | 1 refills | Status: DC

## 2023-12-03 ENCOUNTER — Ambulatory Visit (HOSPITAL_COMMUNITY)
Admission: EM | Admit: 2023-12-03 | Discharge: 2023-12-03 | Disposition: A | Discharge status: Home / Self Care | Payer: Medicaid Other | Attending: Family Medicine | Admitting: Family Medicine

## 2023-12-03 ENCOUNTER — Encounter (HOSPITAL_COMMUNITY): Payer: Self-pay | Admitting: *Deleted

## 2023-12-03 DIAGNOSIS — Z202 Contact with and (suspected) exposure to infections with a predominantly sexual mode of transmission: Secondary | ICD-10-CM | POA: Diagnosis not present

## 2023-12-03 MED ORDER — METRONIDAZOLE 500 MG PO TABS: 2000.0000 mg | ORAL_TABLET | Freq: Once | ORAL | 0 refills | Status: AC

## 2023-12-03 NOTE — ED Triage Notes (Signed)
Pt states that his GF went to doctor and tested positive for Trich   He would like STI testing he denies any sx.

## 2023-12-03 NOTE — Discharge Instructions (Signed)
Staff will notify you if there is anything positive on the swab.  Take metronidazole 500 mg--take 4 tablets by mouth once, all at 1 time.  Take it after eating a decent sized meal.  Avoid drinking alcohol within 72 hours of taking this medication

## 2023-12-03 NOTE — ED Provider Notes (Signed)
[facility]    CSN: [id number] Arrival date & time: 12/03/23  1308      History   Chief Complaint Chief Complaint  Patient presents with   SEXUALLY TRANSMITTED DISEASE    HPI Garrett Clark is a 62 y.o. male.   HPI Here for possible exposure to trichomoniasis.   His girlfriend tested positive and he is here for testing.  He does not have any dysuria or discharge or penile itching.  No fever or vomiting   no allergies to medications    Past Medical History:  Diagnosis Date   Cancer (HCC)    Left kidney   Hyperlipidemia    Internal hemorrhoids    Polycystic liver disease    Substance abuse [facility])     Patient Active Problem List   Diagnosis Date Noted   Stage 3 chronic kidney disease (HCC) 08/10/2023   GERD (gastroesophageal reflux disease) 01/13/2019   History of left radical nephrectomy 01/13/2019   Renal cell carcinoma of left kidney (HCC) 12/07/2017   Prolapsed internal hemorrhoids, grade 2 06/19/2017   Pure hypercholesterolemia 03/30/2017   History of cocaine abuse (HCC) 03/23/2017    Past Surgical History:  Procedure Laterality Date   COLONOSCOPY     HEMORRHOID BANDING     ROBOT ASSISTED LAPAROSCOPIC NEPHRECTOMY Left 12/07/2017   Procedure: XI ROBOTIC ASSISTED LAPAROSCOPIC NEPHRECTOMY;  Surgeon: Malen Gauze, MD;  Location: [facility];  Service: Urology;  Laterality: Left;       Home Medications    Prior to Admission medications   Medication Sig Start Date End Date Taking? Authorizing Provider  amLODipine (NORVASC) 10 MG tablet Take by mouth. 08/31/23 08/30/24 Yes [provider]  atorvastatin (LIPITOR) 20 MG tablet Take 1 tablet (20 mg total) by mouth daily. 11/01/23  Yes Grayce Sessions, NP  cyclobenzaprine (FLEXERIL) 10 MG tablet TAKE 1 TABLET(10 MG) BY MOUTH THREE TIMES DAILY AS NEEDED FOR MUSCLE SPASMS 10/07/23  Yes Magnant, Charles L, PA-C  FARXIGA 10 MG TABS tablet Take 10 mg by mouth every morning.   Yes [provider]  gabapentin (NEURONTIN) 300 MG capsule TAKE 1 CAPSULE BY MOUTH TWICE DAILY 10/20/23  Yes Grayce Sessions, NP  metroNIDAZOLE (FLAGYL) 500 MG tablet Take 4 tablets (2,000 mg total) by mouth once for 1 dose. 12/03/23 12/03/23 Yes Zenia Resides, MD  valsartan-hydrochlorothiazide (DIOVAN-HCT) 80-12.5 MG tablet Take 1 tablet by mouth daily. 06/12/22  Yes Grayce Sessions, NP    Family History Family History  Problem Relation Age of Onset   Diabetes Mother    Hypertension Mother    Alcohol abuse Mother    Diabetes Father    Kidney disease Father    Diabetes Sister    Stroke Sister    Diabetes Paternal Grandmother    Cancer Paternal Grandfather        type unknown   Colon cancer Neg Hx     Social History Social History   Tobacco Use   Smoking status: Former    Current packs/day: 0.00    Types: Cigarettes    Quit date: 12/03/2012    Years since quitting: 11.0   Smokeless tobacco: Never  Vaping Use   Vaping status: Never Used  Substance Use Topics   Alcohol use: Not Currently    Comment: Occasional   Drug use: Yes    Types: Marijuana    Comment: 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   Review of Systems Review of Systems   Physical Exam Triage Vital Signs ED Triage Vitals  Encounter Vitals Group     BP 12/03/23 1328 133/81     Systolic BP Percentile --      Diastolic BP Percentile --      Pulse Rate 12/03/23 1328 82     Resp 12/03/23 1328 18     Temp 12/03/23 1328 97.9 F (36.6 C)     Temp Source 12/03/23 1328 Oral     SpO2 12/03/23 1328 96 %     Weight --      Height --      Head Circumference --      Peak Flow --      Pain Score 12/03/23 1326 0     Pain Loc --      Pain Education --      Exclude from Growth Chart --    No data found.  Updated Vital Signs BP 133/81 (BP Location: Left Arm)   Pulse 82    Temp 97.9 F (36.6 C) (Oral)   Resp 18   SpO2 96%   Visual Acuity Right Eye Distance:   Left Eye Distance:   Bilateral Distance:    Right Eye Near:   Left Eye Near:    Bilateral Near:     Physical Exam Vitals reviewed.  Constitutional:      General: He is not in acute distress.    Appearance: He is not ill-appearing, toxic-appearing or diaphoretic.  Skin:    Coloration: Skin is not pale.  Neurological:     Mental Status: He is alert and oriented to person, place, and time.  Psychiatric:        Behavior: Behavior normal.      UC Treatments / Results  Labs (all labs ordered are listed, but only abnormal results are displayed) Labs Reviewed  CYTOLOGY, (ORAL, ANAL, URETHRAL) ANCILLARY ONLY    EKG   Radiology No results found.  Procedures Procedures (including critical care time)  Medications Ordered in UC Medications - No data to display  Initial Impression / Assessment and Plan / UC Course  I have reviewed the triage vital signs and the nursing notes.  Pertinent labs & imaging results that were available during my care of the patient were reviewed by me and considered in my medical decision making (see chart for details).     Urethral self swab is done and staff will notify him of any positives and treat per protocol.  Discussed options and he is very concerned he has not been to get a call with his positive and it will mostLikely result over the weekend.  Flagyl sent in to treat empirically for trichomonas for him   Final Clinical Impressions(s) / UC Diagnoses   Final diagnoses:  Exposure to STD     Discharge Instructions      Staff will notify you if there is anything positive on the swab.  Take metronidazole 500 mg--take 4 tablets by mouth once, all at 1 time.  Take it after eating a decent sized meal.  Avoid drinking alcohol within 72 hours of taking this medication     ED Prescriptions     Medication Sig Dispense Auth. Provider    metroNIDAZOLE (FLAGYL) 500 MG tablet Take 4 tablets (2,000 mg total) by mouth once for 1 dose.  4 tablet Sekou Zuckerman, Janace Aris, MD      PDMP not reviewed this encounter.   Zenia Resides, MD 12/03/23 [phone]

## 2023-12-04 LAB — CYTOLOGY, (ORAL, ANAL, URETHRAL) ANCILLARY ONLY
Chlamydia: NEGATIVE
Comment: NEGATIVE
Comment: NEGATIVE
Comment: NORMAL
Neisseria Gonorrhea: NEGATIVE
Trichomonas: POSITIVE — AB

## 2024-01-01 ENCOUNTER — Telehealth: Payer: Self-pay | Admitting: Physical Medicine and Rehabilitation

## 2024-01-01 NOTE — Telephone Encounter (Signed)
 Pt requesting appt with [facility] for back pain.

## 2024-01-13 ENCOUNTER — Ambulatory Visit: Payer: Medicaid Other | Admitting: Physical Medicine and Rehabilitation

## 2024-01-18 ENCOUNTER — Encounter: Payer: Self-pay | Admitting: Physical Medicine and Rehabilitation

## 2024-01-18 ENCOUNTER — Ambulatory Visit: Payer: Medicaid Other | Admitting: Physical Medicine and Rehabilitation

## 2024-01-18 VITALS — BP 129/87 | HR 55

## 2024-01-18 DIAGNOSIS — M5441 Lumbago with sciatica, right side: Secondary | ICD-10-CM | POA: Diagnosis not present

## 2024-01-18 DIAGNOSIS — M5416 Radiculopathy, lumbar region: Secondary | ICD-10-CM | POA: Diagnosis not present

## 2024-01-18 DIAGNOSIS — G894 Chronic pain syndrome: Secondary | ICD-10-CM | POA: Diagnosis not present

## 2024-01-18 DIAGNOSIS — G8929 Other chronic pain: Secondary | ICD-10-CM

## 2024-01-18 NOTE — Progress Notes (Unsigned)
Garrett Clark - 63 y.o. male MRN [id number]  Date of birth: [birth year]  Office Visit Note: Visit Date: 01/18/2024 PCP: Grayce Sessions, NP Referred by: Grayce Sessions, NP  Subjective: Chief Complaint  Patient presents with   Lower Back - Pain   Right Leg - Pain   Left Leg - Pain   HPI: Garrett Clark is a 63 y.o. male who comes in today as a self referral for evaluation of chronic, worsening and severe right sided lower back pain radiating to buttock and down posterior leg. Pain ongoing for several years. He is a patient of Dr. Willia Craze, we have seen him intermittently for lumbar epidural steroid injections, he was last seen in our office in January of 2024. His pain is worse with sitting. He describes pain as sore and aching sensation, currently rates as 7 out of 10. Some relief of pain with home exercise regimen, rest and use of medications. No dedicated physical therapy for lumbar spine. Lumbar MRI imaging from 2024 shows mild disc bulging, facet and ligamentous hypertrophy at L3-L4, L4-L5 and L5-S1. No significant nerve impingement or central stenosis. Patient has undergone multiple injections in our office, most recent was right L5-S1 interlaminar epidural steroid injection in our office on 01/08/2023. He reports some relief of pain for about 2 weeks. Reports prior injections did not provide lasting relief of pain. Dr. Christell Constant saw him in March of 2024, his notes can be reviewed further in [facility]. Dr. Christell Constant did not recommend surgical intervention at that time. Patient currently works in maintenance. Patient denies focal weakness, numbness and tingling. No recent trauma or falls.   Patients course is complicated by renal cell carcinoma of left kidney, left renal nephrectomy in 2018.     Review of Systems  Musculoskeletal:  Positive for back pain.  Neurological:  Negative for tingling, sensory change, focal weakness and weakness.  All other systems reviewed and are  negative.  Otherwise per HPI.  Assessment & Plan: Visit Diagnoses:    ICD-10-CM   1. Chronic right-sided low back pain with right-sided sciatica  M54.41 Ambulatory referral to Physical Medicine Rehab   G89.29     2. Lumbar radiculopathy  M54.16 Ambulatory referral to Physical Medicine Rehab    3. Chronic pain syndrome  G89.4 Ambulatory referral to Physical Medicine Rehab       Plan: Findings:  Chronic, worsening and severe right sided lower back pain radiating to buttock and down posterior leg. Patient continues to have severe pain despite good conservative therapies such as home exercise regimen, rest and use of medications. Patients clinical presentation and exam are complex, his symptoms do not directly correlate with lumbar MRI imaging. His pain pattern is more of S1 distribution. We discussed treatment plan in detail today, would like to perform diagnostic and hopefully therapeutic right L5-S1 interlaminar epidural steroid injection under fluoroscopic guidance. If good relief of pain with injection we can repeat this procedure infrequently as needed. Should his pain persist we discussed re-grouping with physical therapy, I also discussed chronic pain management and referral to pain clinic. We will see how he does with injection. He can also follow up with Dr. Christell Constant as needed. He has no questions at this time. No red flag symptoms noted upon exam today.     Meds & Orders: No orders of the defined types were placed in this encounter.   Orders Placed This Encounter  Procedures   Ambulatory referral to Physical Medicine Rehab  Follow-up: Return for Right L5-S1 interlaminar epidural steroid injection.   Procedures: No procedures performed      Clinical History: Narrative & Impression CLINICAL DATA:  Low back pain radiating into the right hip and leg for 1 year. No known injury. Steroid injection 1 month ago.   EXAM: MRI LUMBAR SPINE WITHOUT CONTRAST   TECHNIQUE: Multiplanar,  multisequence MR imaging of the lumbar spine was performed. No intravenous contrast was administered.   COMPARISON:  Lumbar spine radiographs 01/23/2023. Lumbar MRI 01/16/2021.   FINDINGS: Segmentation: Conventional anatomy assumed, with the last open disc space designated L5-S1.Concordant with prior imaging.   Alignment:  Physiologic.   Vertebrae: No worrisome osseous lesion, acute fracture or pars defect. Mildly progressive reactive edema adjacent to the L4-5 facet joints. The visualized sacroiliac joints appear unremarkable.   Conus medullaris: Extends to the L1 level and appears normal.   Paraspinal and other soft tissues: No significant paraspinal findings. Similar scattered T2 hyperintensities within the liver and right kidney, likely cysts based on stability; no follow-up imaging recommended. The left kidney appears surgically absent.   Disc levels:   No significant disc space findings from T11-12 through L2-3.   L3-4: Stable mild loss of disc height with mild disc bulging, facet and ligamentous hypertrophy. No resulting spinal stenosis or nerve root encroachment.   L4-5: Preserved disc height with mild disc bulging, facet and ligamentous hypertrophy. No focal disc protrusion, nerve root encroachment or significant spinal stenosis demonstrated currently.   L5-S1: Mild disc bulging eccentric to the left. Mild bilateral facet hypertrophy. Mild inferior left foraminal narrowing without nerve root encroachment or significant spinal stenosis.   IMPRESSION: 1. No acute findings or explanation for the patient's symptoms. 2. Mild disc bulging, facet and ligamentous hypertrophy at L3-4, L4-5 and L5-S1, similar to previous MRI. No resulting significant spinal stenosis or nerve root encroachment. 3. Mildly progressive reactive edema adjacent to the L4-5 facet joints.     Electronically Signed   By: Carey Bullocks M.D.   On: 03/03/2023 08:45   He reports that he quit  smoking about 11 years ago. His smoking use included cigarettes. He has never used smokeless tobacco. No results for input(s): "HGBA1C", "LABURIC" in the last 8760 hours.  Objective:  VS:  HT:    WT:   BMI:     BP:129/87  HR:(!) 55bpm  TEMP: ( )  RESP:  Physical Exam Vitals and nursing note reviewed.  HENT:     Head: Normocephalic and atraumatic.     Right Ear: External ear normal.     Left Ear: External ear normal.     Nose: Nose normal.     Mouth/Throat:     Mouth: Mucous membranes are moist.  Eyes:     Extraocular Movements: Extraocular movements intact.  Cardiovascular:     Rate and Rhythm: Normal rate.     Pulses: Normal pulses.  Pulmonary:     Effort: Pulmonary effort is normal.  Abdominal:     General: Abdomen is flat. There is no distension.  Musculoskeletal:        General: Tenderness present.     Cervical back: Normal range of motion.     Comments: Patient rises from seated position to standing without difficulty. Good lumbar range of motion. No pain noted with facet loading. 5/5 strength noted with bilateral hip flexion, knee flexion/extension, ankle dorsiflexion/plantarflexion and EHL. No clonus noted bilaterally. No pain upon palpation of greater trochanters. No pain with internal/external rotation of bilateral hips.  Sensation intact bilaterally. Dysesthesias noted to right S1 dermatome. Negative slump test bilaterally. Ambulates without aid, gait steady.     Skin:    General: Skin is warm and dry.     Capillary Refill: Capillary refill takes less than 2 seconds.  Neurological:     General: No focal deficit present.     Mental Status: He is alert and oriented to person, place, and time.  Psychiatric:        Mood and Affect: Mood normal.        Behavior: Behavior normal.     Ortho Exam  Imaging: No results found.  Past Medical/Family/Surgical/Social History: Medications & Allergies reviewed per EMR, new medications updated. Patient Active Problem List    Diagnosis Date Noted   Stage 3 chronic kidney disease (HCC) 08/10/2023   GERD (gastroesophageal reflux disease) 01/13/2019   History of left radical nephrectomy 01/13/2019   Renal cell carcinoma of left kidney (HCC) 12/07/2017   Prolapsed internal hemorrhoids, grade 2 06/19/2017   Pure hypercholesterolemia 03/30/2017   History of cocaine abuse (HCC) 03/23/2017   Past Medical History:  Diagnosis Date   Cancer (HCC)    Left kidney   Hyperlipidemia    Internal hemorrhoids    Polycystic liver disease    Substance abuse (HCC)    Family History  Problem Relation Age of Onset   Diabetes Mother    Hypertension Mother    Alcohol abuse Mother    Diabetes Father    Kidney disease Father    Diabetes Sister    Stroke Sister    Diabetes Paternal Grandmother    Cancer Paternal Grandfather        type unknown   Colon cancer Neg Hx    Past Surgical History:  Procedure Laterality Date   COLONOSCOPY     HEMORRHOID BANDING     ROBOT ASSISTED LAPAROSCOPIC NEPHRECTOMY Left 12/07/2017   Procedure: XI ROBOTIC ASSISTED LAPAROSCOPIC NEPHRECTOMY;  Surgeon: Malen Gauze, MD;  Location: [facility];  Service: Urology;  Laterality: Left;   Social History   Occupational History    Employer: [employer]  Tobacco Use   Smoking status: Former    Current packs/day: 0.00    Types: Cigarettes    Quit date: 12/03/2012    Years since quitting: 11.1   Smokeless tobacco: Never  Vaping Use   Vaping status: Never Used  Substance and Sexual Activity   Alcohol use: Not Currently    Comment: Occasional   Drug use: Yes    Types: Marijuana    Comment: was addicted to cocaine for 23 yrs but clean now for about  12-13 yrs; last  marijuana  use was this mo rning    Sexual activity: Yes    Birth control/protection: None

## 2024-01-18 NOTE — Progress Notes (Unsigned)
Functional Pain Scale - descriptive words and definitions  Unmanageable (7)  Pain interferes with normal ADL's/nothing seems to help/sleep is very difficult/active distractions are very difficult to concentrate on. Severe range order  Average Pain 7  LBP that radiates to both legs. Pain in right leg is like a shooting pain. Pain on the left is around the inside of knee.

## 2024-01-21 ENCOUNTER — Encounter (INDEPENDENT_AMBULATORY_CARE_PROVIDER_SITE_OTHER): Payer: Self-pay | Admitting: Primary Care

## 2024-01-21 ENCOUNTER — Ambulatory Visit (INDEPENDENT_AMBULATORY_CARE_PROVIDER_SITE_OTHER): Payer: Medicaid Other | Admitting: Primary Care

## 2024-01-21 VITALS — BP 136/84 | HR 63 | Resp 16 | Ht 69.0 in | Wt 192.4 lb

## 2024-01-21 DIAGNOSIS — Z2821 Immunization not carried out because of patient refusal: Secondary | ICD-10-CM

## 2024-01-21 DIAGNOSIS — I10 Essential (primary) hypertension: Secondary | ICD-10-CM

## 2024-01-21 DIAGNOSIS — Z5912 Inadequate housing utilities: Secondary | ICD-10-CM

## 2024-01-21 DIAGNOSIS — E782 Mixed hyperlipidemia: Secondary | ICD-10-CM | POA: Diagnosis not present

## 2024-01-21 DIAGNOSIS — Z5941 Food insecurity: Secondary | ICD-10-CM | POA: Diagnosis not present

## 2024-01-21 NOTE — Progress Notes (Signed)
[facility]  Garrett Clark, is a 63 y.o. male  VOZ:[id number]  QQV:[id number]  DOB - [birth year]  Chief Complaint  Patient presents with   Hyperlipidemia   Hypertension       Subjective:   Garrett Clark is a 63 y.o. male here today for a follow up visit HTN. Patient has No headache, No chest pain, No abdominal pain - No Nausea, No new weakness tingling or numbness, No Cough - shortness of breath He is doing emotionally better.   No problems updated.  Comprehensive ROS Pertinent positive and negative noted in HPI   No Known Allergies  Past Medical History:  Diagnosis Date   Cancer (HCC)    Left kidney   Hyperlipidemia    Internal hemorrhoids    Polycystic liver disease    Substance abuse (HCC)     Current Outpatient Medications on File Prior to Visit  Medication Sig Dispense Refill   amLODipine (NORVASC) 10 MG tablet Take by mouth.     atorvastatin (LIPITOR) 20 MG tablet Take 1 tablet (20 mg total) by mouth daily. 90 tablet 1   cyclobenzaprine (FLEXERIL) 10 MG tablet TAKE 1 TABLET(10 MG) BY MOUTH THREE TIMES DAILY AS NEEDED FOR MUSCLE SPASMS 90 tablet 1   FARXIGA 10 MG TABS tablet Take 10 mg by mouth every morning.     gabapentin (NEURONTIN) 300 MG capsule TAKE 1 CAPSULE BY MOUTH TWICE DAILY 180 capsule 0   valsartan-hydrochlorothiazide (DIOVAN-HCT) 80-12.5 MG tablet Take 1 tablet by mouth daily. 90 tablet 3   No current facility-administered medications on file prior to visit.   Health Maintenance  Topic Date Due   COVID-19 Vaccine (3 - Moderna risk series) 06/26/2020   Flu Shot  03/21/2024*   Zoster (Shingles) Vaccine (2 of 2) 04/20/2024*   Pneumococcal Vaccination (1 of 2 - PCV) 01/20/2025*   DTaP/Tdap/Td vaccine (2 - Td or Tdap) 03/24/2027   Colon Cancer Screening  05/09/2027   Hepatitis C Screening  Completed   HIV Screening  Completed   HPV Vaccine  Aged Out  *Topic was postponed. The date shown is not the original due date.     Objective:   Vitals:   01/21/24 1006 01/21/24 1007  BP: 139/81 136/84  Pulse: 63   Resp: 16   SpO2: 98%   Weight: 192 lb 6.4 oz (87.3 kg)   Height: 5\' 9"  (1.753 m)    BP Readings from Last 3 Encounters:  01/21/24 136/84  01/18/24 129/87  12/03/23 133/81      Physical Exam Vitals reviewed.  Constitutional:      Appearance: He is normal weight.  HENT:     Head: Normocephalic.     Right Ear: Tympanic membrane and external ear normal.     Left Ear: Tympanic membrane and external ear normal.  Eyes:     Extraocular Movements: Extraocular movements intact.  Cardiovascular:     Rate and Rhythm: Normal rate and regular rhythm.  Pulmonary:     Effort: Pulmonary effort is normal.     Breath sounds: Normal breath sounds.  Abdominal:     General: Bowel sounds are normal. There is distension.     Palpations: Abdomen is soft.  Musculoskeletal:        General: Normal range of motion.     Cervical back: Normal range of motion.  Skin:    General: Skin is warm and dry.  Neurological:     Mental Status: He is alert and oriented to person,  place, and time.  Psychiatric:        Mood and Affect: Mood normal.        Behavior: Behavior normal.        Thought Content: Thought content normal.     Assessment & Plan  Mixed hyperlipidemia Currently on a statin -     Lipid panel  Essential hypertension Controlled less than 140/90   Explained that having normal blood pressure is the goal and medications are helping to get to goal and maintain normal blood pressure. DIET: Limit salt intake, read nutrition labels to check salt content, limit fried and high fatty foods  Avoid using multisymptom OTC cold preparations that generally contain sudafed which can rise BP. Consult with pharmacist on best cold relief products to use for persons with HTN EXERCISE Discussed incorporating exercise such as walking - 30 minutes most days of the week and can do in 10 minute intervals    -     CBC with  Differential/Platelet -     CMP14+EGFR  Herpes zoster vaccination declined  Influenza vaccination declined  Pneumococcal vaccination declined  Food insecurity -     AMB Referral [facility] Care Management  Inadequate housing utilities -     AMB Referral [facility] Care Management     Patient have been counseled extensively about nutrition and exercise. Other issues discussed during this visit include: low cholesterol diet, weight control and daily exercise, foot care, annual eye examinations at Ophthalmology, importance of adherence with medications and regular follow-up. We also discussed long term complications of uncontrolled diabetes and hypertension.   Return in about 3 months (around 04/20/2024) for medical conditions.  The patient was given clear instructions to go to ER or return to medical center if symptoms don't improve, worsen or new problems develop. The patient verbalized understanding. The patient was told to call to get lab results if they haven't heard anything in the next week.   This note has been created with Education officer, environmental. Any transcriptional errors are unintentional.   Grayce Sessions, NP 01/24/2024, 9:24 PM

## 2024-01-21 NOTE — Patient Instructions (Signed)
[]    atorvastatin (LIPITOR) 20 MG tablet 11/01/2023      20 mg, DailyAuthorized By: Grayce Sessions, NPDispense: 90 tabletRefills: 1 ordered

## 2024-01-22 ENCOUNTER — Ambulatory Visit (INDEPENDENT_AMBULATORY_CARE_PROVIDER_SITE_OTHER): Payer: Medicaid Other | Admitting: Surgical

## 2024-01-22 ENCOUNTER — Other Ambulatory Visit (INDEPENDENT_AMBULATORY_CARE_PROVIDER_SITE_OTHER): Payer: Self-pay

## 2024-01-22 DIAGNOSIS — M1712 Unilateral primary osteoarthritis, left knee: Secondary | ICD-10-CM

## 2024-01-22 DIAGNOSIS — M25562 Pain in left knee: Secondary | ICD-10-CM | POA: Diagnosis not present

## 2024-01-22 DIAGNOSIS — G8929 Other chronic pain: Secondary | ICD-10-CM

## 2024-01-22 LAB — CBC WITH DIFFERENTIAL/PLATELET
Basophils Absolute: 0.1 10*3/uL (ref 0.0–0.2)
Basos: 1 %
EOS (ABSOLUTE): 0.5 10*3/uL — ABNORMAL HIGH (ref 0.0–0.4)
Eos: 9 %
Hematocrit: 44.6 % (ref 37.5–51.0)
Hemoglobin: 15.4 g/dL (ref 13.0–17.7)
Immature Grans (Abs): 0 10*3/uL (ref 0.0–0.1)
Immature Granulocytes: 0 %
Lymphocytes Absolute: 1.9 10*3/uL (ref 0.7–3.1)
Lymphs: 32 %
MCH: 32 pg (ref 26.6–33.0)
MCHC: 34.5 g/dL (ref 31.5–35.7)
MCV: 93 fL (ref 79–97)
Monocytes Absolute: 0.5 10*3/uL (ref 0.1–0.9)
Monocytes: 9 %
Neutrophils Absolute: 3 10*3/uL (ref 1.4–7.0)
Neutrophils: 49 %
Platelets: 388 10*3/uL (ref 150–450)
RBC: 4.81 x10E6/uL (ref 4.14–5.80)
RDW: 14.2 % (ref 11.6–15.4)
WBC: 6 10*3/uL (ref 3.4–10.8)

## 2024-01-22 LAB — CMP14+EGFR
ALT: 19 [IU]/L (ref 0–44)
AST: 22 [IU]/L (ref 0–40)
Albumin: 4.3 g/dL (ref 3.8–4.9)
Alkaline Phosphatase: 109 [IU]/L (ref 44–121)
BUN/Creatinine Ratio: 6 — ABNORMAL LOW (ref 10–24)
BUN: 8 mg/dL (ref 8–27)
Bilirubin Total: 0.6 mg/dL (ref 0.0–1.2)
CO2: 24 mmol/L (ref 20–29)
Calcium: 9.3 mg/dL (ref 8.6–10.2)
Chloride: 102 mmol/L (ref 96–106)
Creatinine, Ser: 1.3 mg/dL — ABNORMAL HIGH (ref 0.76–1.27)
Globulin, Total: 3.1 g/dL (ref 1.5–4.5)
Glucose: 75 mg/dL (ref 70–99)
Potassium: 4.7 mmol/L (ref 3.5–5.2)
Sodium: 140 mmol/L (ref 134–144)
Total Protein: 7.4 g/dL (ref 6.0–8.5)
eGFR: 63 mL/min/{1.73_m2} (ref >59)

## 2024-01-22 LAB — LIPID PANEL
Chol/HDL Ratio: 4.4 {ratio} (ref 0.0–5.0)
Cholesterol, Total: 150 mg/dL (ref 100–199)
HDL: 34 mg/dL — ABNORMAL LOW (ref >39)
LDL Chol Calc (NIH): 90 mg/dL (ref 0–99)
Triglycerides: 146 mg/dL (ref 0–149)
VLDL Cholesterol Cal: 26 mg/dL (ref 5–40)

## 2024-01-24 ENCOUNTER — Encounter (INDEPENDENT_AMBULATORY_CARE_PROVIDER_SITE_OTHER): Payer: Self-pay | Admitting: Primary Care

## 2024-01-26 ENCOUNTER — Telehealth: Payer: Self-pay | Admitting: *Deleted

## 2024-01-26 ENCOUNTER — Encounter (INDEPENDENT_AMBULATORY_CARE_PROVIDER_SITE_OTHER): Payer: Self-pay | Admitting: Primary Care

## 2024-01-26 ENCOUNTER — Encounter: Payer: Self-pay | Admitting: Surgical

## 2024-01-26 MED ORDER — METHYLPREDNISOLONE ACETATE 40 MG/ML IJ SUSP
40.0000 mg | INTRAMUSCULAR | Status: AC | PRN
  Administered 2024-01-22: 40 mg via INTRA_ARTICULAR

## 2024-01-26 MED ORDER — BUPIVACAINE HCL 0.25 % IJ SOLN
4.0000 mL | INTRAMUSCULAR | Status: AC | PRN
  Administered 2024-01-22: 4 mL via INTRA_ARTICULAR

## 2024-01-26 MED ORDER — LIDOCAINE HCL 1 % IJ SOLN
5.0000 mL | INTRAMUSCULAR | Status: AC | PRN
  Administered 2024-01-22: 5 mL

## 2024-01-26 NOTE — Progress Notes (Signed)
 Complex Care Management Note Care Guide Note  01/26/2024 Name: Garrett Clark MRN: [id number] DOB: 08-Dec-1961  Garrett Clark is a 63 y.o. year old male who is a primary care patient of Celestia Rosaline SQUIBB, NP . The community resource team was consulted for assistance with Food Insecurity  SDOH screenings and interventions completed:  No        Care guide performed the following interventions:  patient declined services said he had no real needs at this time utility bill gets high but he figures it out provided information about how to get help with utilities if he gets behind  .  Follow Up Plan:  No further follow up planned at this time. The patient has been provided with needed resources.  Encounter Outcome:  Patient Visit Completed  Dazaria Macneill Greenauer-Moran  [facility] HealthPopulation Health Care Guide  Direct Dial:[phone] Fax:[phone] Website: [email or web address]

## 2024-01-26 NOTE — Progress Notes (Signed)
Office Visit Note   Patient: Garrett Clark           Date of Birth: 04/21/1961           MRN: [id number] Visit Date: 01/22/2024 Requested by: Grayce Sessions, NP [address] [place],  [place] [address] PCP: Grayce Sessions, NP  Subjective: Chief Complaint  Patient presents with   Left Knee - Pain    HPI: Garrett Clark is a 63 y.o. male who presents to the office reporting left knee pain.  Patient reports history of left knee pain for several years that has been worse recently.  Does have history of a knee fracture which was treated nonoperatively in 2015.  Does not remember exactly what part of his knee was fractured.  He works maintenance at [employer] and this pain has not caused him to miss significant amount of time.  Mostly localizes pain to the medial aspect of the left knee.  Does have pain at night.  No recent falls or injuries.  No fevers or chills.  Cannot take NSAIDs because of his history of nephrectomy.  No groin pain or radicular pain..                ROS: All systems reviewed are negative as they relate to the chief complaint within the history of present illness.  Patient denies fevers or chills.  Assessment & Plan: Visit Diagnoses:  1. Arthritis of left knee   2. Chronic pain of left knee     Plan: Patient is a 63 year old male who presents for evaluation of left knee pain.  Has remote history of trauma to the knee but no recent falls or injuries..  Has radiographs taken today demonstrating mild joint space narrowing of the medial compartment.  We discussed options available to patient.  He would like to try cortisone injection.  Will see how this goes but if no improvement, next step would be MRI for further evaluation of potential meniscal pathology.    Follow-Up Instructions: No follow-ups on file.   Orders:  Orders Placed This Encounter  Procedures   XR Knee 1-2 Views Left   No orders of the defined types were placed in this encounter.      Procedures: Large Joint Inj: L knee on 01/22/2024 3:15 PM Indications: diagnostic evaluation, joint swelling and pain Details: 18 G 1.5 in needle, superolateral approach  Arthrogram: No  Medications: 5 mL lidocaine 1 %; 40 mg methylPREDNISolone acetate 40 MG/ML; 4 mL bupivacaine 0.25 % Outcome: tolerated well, no immediate complications Procedure, treatment alternatives, risks and benefits explained, specific risks discussed. Consent was given by the patient. Immediately prior to procedure a time out was called to verify the correct patient, procedure, equipment, support staff and site/side marked as required. Patient was prepped and draped in the usual sterile fashion.       Clinical Data: No additional findings.  Objective: Vital Signs: There were no vitals taken for this visit.  Physical Exam:  Constitutional: Patient appears well-developed HEENT:  Head: Normocephalic Eyes:EOM are normal Neck: Normal range of motion Cardiovascular: Normal rate Pulmonary/chest: Effort normal Neurologic: Patient is alert Skin: Skin is warm Psychiatric: Patient has normal mood and affect  Ortho Exam: Ortho exam demonstrates left knee with trace effusion.  No cellulitis or skin changes noted.  Moderate tenderness over the medial joint line and mild tenderness over the lateral joint line.  Able to perform straight leg raise without extensor lag.  No pain with  hip range of motion.  Stable to anterior and posterior drawer sign.  No calf tenderness.  Negative Homans' sign..  Palp DP pulse.  Has full range of motion from 0 to 120 degrees of knee flexion.  Specialty Comments:  Narrative & Impression CLINICAL DATA:  Low back pain radiating into the right hip and leg for 1 year. No known injury. Steroid injection 1 month ago.   EXAM: MRI LUMBAR SPINE WITHOUT CONTRAST   TECHNIQUE: Multiplanar, multisequence MR imaging of the lumbar spine was performed. No intravenous contrast was administered.    COMPARISON:  Lumbar spine radiographs 01/23/2023. Lumbar MRI 01/16/2021.   FINDINGS: Segmentation: Conventional anatomy assumed, with the last open disc space designated L5-S1.Concordant with prior imaging.   Alignment:  Physiologic.   Vertebrae: No worrisome osseous lesion, acute fracture or pars defect. Mildly progressive reactive edema adjacent to the L4-5 facet joints. The visualized sacroiliac joints appear unremarkable.   Conus medullaris: Extends to the L1 level and appears normal.   Paraspinal and other soft tissues: No significant paraspinal findings. Similar scattered T2 hyperintensities within the liver and right kidney, likely cysts based on stability; no follow-up imaging recommended. The left kidney appears surgically absent.   Disc levels:   No significant disc space findings from T11-12 through L2-3.   L3-4: Stable mild loss of disc height with mild disc bulging, facet and ligamentous hypertrophy. No resulting spinal stenosis or nerve root encroachment.   L4-5: Preserved disc height with mild disc bulging, facet and ligamentous hypertrophy. No focal disc protrusion, nerve root encroachment or significant spinal stenosis demonstrated currently.   L5-S1: Mild disc bulging eccentric to the left. Mild bilateral facet hypertrophy. Mild inferior left foraminal narrowing without nerve root encroachment or significant spinal stenosis.   IMPRESSION: 1. No acute findings or explanation for the patient's symptoms. 2. Mild disc bulging, facet and ligamentous hypertrophy at L3-4, L4-5 and L5-S1, similar to previous MRI. No resulting significant spinal stenosis or nerve root encroachment. 3. Mildly progressive reactive edema adjacent to the L4-5 facet joints.     Electronically Signed   By: Carey Bullocks M.D.   On: 03/03/2023 08:45  Imaging: No results found.   PMFS History: Patient Active Problem List   Diagnosis Date Noted   Stage 3 chronic kidney  disease (HCC) 08/10/2023   GERD (gastroesophageal reflux disease) 01/13/2019   History of left radical nephrectomy 01/13/2019   Renal cell carcinoma of left kidney (HCC) 12/07/2017   Prolapsed internal hemorrhoids, grade 2 06/19/2017   Pure hypercholesterolemia 03/30/2017   History of cocaine abuse (HCC) 03/23/2017   Past Medical History:  Diagnosis Date   Cancer (HCC)    Left kidney   Hyperlipidemia    Internal hemorrhoids    Polycystic liver disease    Substance abuse (HCC)     Family History  Problem Relation Age of Onset   Diabetes Mother    Hypertension Mother    Alcohol abuse Mother    Diabetes Father    Kidney disease Father    Diabetes Sister    Stroke Sister    Diabetes Paternal Grandmother    Cancer Paternal Grandfather        type unknown   Colon cancer Neg Hx     Past Surgical History:  Procedure Laterality Date   COLONOSCOPY     HEMORRHOID BANDING     ROBOT ASSISTED LAPAROSCOPIC NEPHRECTOMY Left 12/07/2017   Procedure: XI ROBOTIC ASSISTED LAPAROSCOPIC NEPHRECTOMY;  Surgeon: Malen Gauze, MD;  Location:  [facility];  Service: Urology;  Laterality: Left;   Social History   Occupational History    Employer: [employer]  Tobacco Use   Smoking status: Former    Current packs/day: 0.00    Types: Cigarettes    Quit date: 12/03/2012    Years since quitting: 11.1   Smokeless tobacco: Never  Vaping Use   Vaping status: Never Used  Substance and Sexual Activity   Alcohol use: Not Currently    Comment: Occasional   Drug use: Yes    Types: Marijuana    Comment: was addicted to cocaine for 23 yrs but clean now for about  12-13 yrs; last  marijuana  use was this mo rning    Sexual activity: Yes    Birth control/protection: None

## 2024-02-04 ENCOUNTER — Other Ambulatory Visit: Payer: Self-pay

## 2024-02-04 ENCOUNTER — Ambulatory Visit: Payer: Medicaid Other | Admitting: Physical Medicine and Rehabilitation

## 2024-02-04 VITALS — BP 139/84 | HR 48

## 2024-02-04 DIAGNOSIS — M5416 Radiculopathy, lumbar region: Secondary | ICD-10-CM | POA: Diagnosis not present

## 2024-02-04 MED ORDER — METHYLPREDNISOLONE ACETATE 40 MG/ML IJ SUSP
40.0000 mg | Freq: Once | INTRAMUSCULAR | Status: AC
  Administered 2024-02-04: 40 mg

## 2024-02-04 NOTE — Procedures (Signed)
Lumbar Epidural Steroid Injection - Interlaminar Approach with Fluoroscopic Guidance  Patient: Garrett Clark      Date of Birth: 03-Dec-1961 MRN: [id number] PCP: Grayce Sessions, NP      Visit Date: 02/04/2024   Universal Protocol:     Consent Given By: the patient  Position: PRONE  Additional Comments: Vital signs were monitored before and after the procedure. Patient was prepped and draped in the usual sterile fashion. The correct patient, procedure, and site was verified.   Injection Procedure Details:   Procedure diagnoses: Lumbar radiculopathy [M54.16]   Meds Administered:  Meds ordered this encounter  Medications   methylPREDNISolone acetate (DEPO-MEDROL) injection 40 mg     Laterality: Right  Location/Site:  L5-S1  Needle: 3.5 in., 20 ga. Tuohy  Needle Placement: Paramedian epidural  Findings:   -Comments: Excellent flow of contrast into the epidural space.  Procedure Details: Using a paramedian approach from the side mentioned above, the region overlying the inferior lamina was localized under fluoroscopic visualization and the soft tissues overlying this structure were infiltrated with 4 ml. of 1% Lidocaine without Epinephrine. The Tuohy needle was inserted into the epidural space using a paramedian approach.   The epidural space was localized using loss of resistance along with counter oblique bi-planar fluoroscopic views.  After negative aspirate for air, blood, and CSF, a 2 ml. volume of Isovue-250 was injected into the epidural space and the flow of contrast was observed. Radiographs were obtained for documentation purposes.    The injectate was administered into the level noted above.   Additional Comments:  The patient tolerated the procedure well Dressing: 2 x 2 sterile gauze and Band-Aid    Post-procedure details: Patient was observed during the procedure. Post-procedure instructions were reviewed.  Patient left the clinic in stable  condition.

## 2024-02-04 NOTE — Progress Notes (Signed)
Pain Scale----7 No Complaints of tingling "I have weakness and numbness all the time"

## 2024-02-04 NOTE — Patient Instructions (Signed)

## 2024-02-04 NOTE — Progress Notes (Signed)
 ROHAAN DURNIL - 63 y.o. male MRN [id number]  Date of birth: 10/25/1961  Office Visit Note: Visit Date: 02/04/2024 PCP: Grayce Sessions, NP Referred by: Grayce Sessions, NP  Subjective: Chief Complaint  Patient presents with   Lower Back - Pain   HPI:  JONAS GOH is a 64 y.o. male who comes in today at the request of Ellin Goodie, FNP for planned Right L5-S1 Lumbar Interlaminar epidural steroid injection with fluoroscopic guidance.  The patient has failed conservative care including home exercise, medications, time and activity modification.  This injection will be diagnostic and hopefully therapeutic.  Please see requesting physician notes for further details and justification.   Just FYI his left knee doing well after injection by Karenann Cai, PA-C   ROS Otherwise per HPI.  Assessment & Plan: Visit Diagnoses:    ICD-10-CM   1. Lumbar radiculopathy  M54.16 XR C-ARM NO REPORT    Epidural Steroid injection    methylPREDNISolone acetate (DEPO-MEDROL) injection 40 mg      Plan: No additional findings.   Meds & Orders:  Meds ordered this encounter  Medications   methylPREDNISolone acetate (DEPO-MEDROL) injection 40 mg    Orders Placed This Encounter  Procedures   XR C-ARM NO REPORT   Epidural Steroid injection    Follow-up: Return for visit to requesting provider as needed.   Procedures: No procedures performed  Lumbar Epidural Steroid Injection - Interlaminar Approach with Fluoroscopic Guidance  Patient: KARAS PICKERILL      Date of Birth: 02-16-1961 MRN: [id number] PCP: Grayce Sessions, NP      Visit Date: 02/04/2024   Universal Protocol:     Consent Given By: the patient  Position: PRONE  Additional Comments: Vital signs were monitored before and after the procedure. Patient was prepped and draped in the usual sterile fashion. The correct patient, procedure, and site was verified.   Injection Procedure Details:   Procedure  diagnoses: Lumbar radiculopathy [M54.16]   Meds Administered:  Meds ordered this encounter  Medications   methylPREDNISolone acetate (DEPO-MEDROL) injection 40 mg     Laterality: Right  Location/Site:  L5-S1  Needle: 3.5 in., 20 ga. Tuohy  Needle Placement: Paramedian epidural  Findings:   -Comments: Excellent flow of contrast into the epidural space.  Procedure Details: Using a paramedian approach from the side mentioned above, the region overlying the inferior lamina was localized under fluoroscopic visualization and the soft tissues overlying this structure were infiltrated with 4 ml. of 1% Lidocaine without Epinephrine. The Tuohy needle was inserted into the epidural space using a paramedian approach.   The epidural space was localized using loss of resistance along with counter oblique bi-planar fluoroscopic views.  After negative aspirate for air, blood, and CSF, a 2 ml. volume of Isovue-250 was injected into the epidural space and the flow of contrast was observed. Radiographs were obtained for documentation purposes.    The injectate was administered into the level noted above.   Additional Comments:  The patient tolerated the procedure well Dressing: 2 x 2 sterile gauze and Band-Aid    Post-procedure details: Patient was observed during the procedure. Post-procedure instructions were reviewed.  Patient left the clinic in stable condition.   Clinical History: Narrative & Impression CLINICAL DATA:  Low back pain radiating into the right hip and leg for 1 year. No known injury. Steroid injection 1 month ago.   EXAM: MRI LUMBAR SPINE WITHOUT CONTRAST   TECHNIQUE: Multiplanar, multisequence MR imaging of  the lumbar spine was performed. No intravenous contrast was administered.   COMPARISON:  Lumbar spine radiographs 01/23/2023. Lumbar MRI 01/16/2021.   FINDINGS: Segmentation: Conventional anatomy assumed, with the last open disc space designated  L5-S1.Concordant with prior imaging.   Alignment:  Physiologic.   Vertebrae: No worrisome osseous lesion, acute fracture or pars defect. Mildly progressive reactive edema adjacent to the L4-5 facet joints. The visualized sacroiliac joints appear unremarkable.   Conus medullaris: Extends to the L1 level and appears normal.   Paraspinal and other soft tissues: No significant paraspinal findings. Similar scattered T2 hyperintensities within the liver and right kidney, likely cysts based on stability; no follow-up imaging recommended. The left kidney appears surgically absent.   Disc levels:   No significant disc space findings from T11-12 through L2-3.   L3-4: Stable mild loss of disc height with mild disc bulging, facet and ligamentous hypertrophy. No resulting spinal stenosis or nerve root encroachment.   L4-5: Preserved disc height with mild disc bulging, facet and ligamentous hypertrophy. No focal disc protrusion, nerve root encroachment or significant spinal stenosis demonstrated currently.   L5-S1: Mild disc bulging eccentric to the left. Mild bilateral facet hypertrophy. Mild inferior left foraminal narrowing without nerve root encroachment or significant spinal stenosis.   IMPRESSION: 1. No acute findings or explanation for the patient's symptoms. 2. Mild disc bulging, facet and ligamentous hypertrophy at L3-4, L4-5 and L5-S1, similar to previous MRI. No resulting significant spinal stenosis or nerve root encroachment. 3. Mildly progressive reactive edema adjacent to the L4-5 facet joints.     Electronically Signed   By: Carey Bullocks M.D.   On: 03/03/2023 08:45     Objective:  VS:  HT:    WT:   BMI:     BP:139/84  HR:(!) 48bpm  TEMP: ( )  RESP:  Physical Exam Vitals and nursing note reviewed.  Constitutional:      General: He is not in acute distress.    Appearance: Normal appearance. He is not ill-appearing.  HENT:     Head: Normocephalic and  atraumatic.     Right Ear: External ear normal.     Left Ear: External ear normal.     Nose: No congestion.  Eyes:     Extraocular Movements: Extraocular movements intact.  Cardiovascular:     Rate and Rhythm: Normal rate.     Pulses: Normal pulses.  Pulmonary:     Effort: Pulmonary effort is normal. No respiratory distress.  Abdominal:     General: There is no distension.     Palpations: Abdomen is soft.  Musculoskeletal:        General: No tenderness or signs of injury.     Cervical back: Neck supple.     Right lower leg: No edema.     Left lower leg: No edema.     Comments: Patient has good distal strength without clonus.  Skin:    Findings: No erythema or rash.  Neurological:     General: No focal deficit present.     Mental Status: He is alert and oriented to person, place, and time.     Sensory: No sensory deficit.     Motor: No weakness or abnormal muscle tone.     Coordination: Coordination normal.  Psychiatric:        Mood and Affect: Mood normal.        Behavior: Behavior normal.      Imaging: No results found.

## 2024-02-11 ENCOUNTER — Encounter (HOSPITAL_COMMUNITY): Payer: Self-pay | Admitting: *Deleted

## 2024-02-11 ENCOUNTER — Ambulatory Visit (HOSPITAL_COMMUNITY)
Admission: EM | Admit: 2024-02-11 | Discharge: 2024-02-11 | Disposition: A | Discharge status: Home / Self Care | Payer: Medicaid Other | Attending: Emergency Medicine | Admitting: Emergency Medicine

## 2024-02-11 DIAGNOSIS — Z113 Encounter for screening for infections with a predominantly sexual mode of transmission: Secondary | ICD-10-CM | POA: Insufficient documentation

## 2024-02-11 NOTE — ED Provider Notes (Signed)
[facility]    CSN: [id number] Arrival date & time: 02/11/24  1211      History   Chief Complaint Chief Complaint  Patient presents with   [facility] TRANSMITTED DISEASE    HPI Garrett Clark is a 63 y.o. male.   Patient presented requesting STD testing.  Denies any known exposures or symptoms.     Past Medical History:  Diagnosis Date   Cancer [facility])    Left kidney   Hyperlipidemia    Internal hemorrhoids    Polycystic liver disease    Substance abuse [facility])     Patient Active Problem List   Diagnosis Date Noted   Stage 3 chronic kidney disease (HCC) 08/10/2023   GERD (gastroesophageal reflux disease) 01/13/2019   History of left radical nephrectomy 01/13/2019   Renal cell carcinoma of left kidney (HCC) 12/07/2017   Prolapsed internal hemorrhoids, grade 2 06/19/2017   Pure hypercholesterolemia 03/30/2017   History of cocaine abuse (HCC) 03/23/2017    Past Surgical History:  Procedure Laterality Date   COLONOSCOPY     HEMORRHOID BANDING     ROBOT ASSISTED LAPAROSCOPIC NEPHRECTOMY Left 12/07/2017   Procedure: XI ROBOTIC ASSISTED LAPAROSCOPIC NEPHRECTOMY;  Surgeon: Malen Gauze, MD;  Location: [facility];  Service: Urology;  Laterality: Left;       Home Medications    Prior to Admission medications   Medication Sig Start Date End Date Taking? Authorizing Provider  amLODipine (NORVASC) 10 MG tablet Take by mouth. 08/31/23 08/30/24 Yes [provider]  atorvastatin (LIPITOR) 20 MG tablet Take 1 tablet (20 mg total) by mouth daily. 11/01/23  Yes Grayce Sessions, NP  cyclobenzaprine (FLEXERIL) 10 MG tablet TAKE 1 TABLET(10 MG) BY MOUTH THREE TIMES DAILY AS NEEDED FOR MUSCLE SPASMS 10/07/23  Yes Magnant, Charles L, PA-C  FARXIGA 10 MG TABS tablet Take 10 mg by mouth every morning.   Yes [provider]  gabapentin (NEURONTIN) 300 MG capsule TAKE 1 CAPSULE BY MOUTH TWICE DAILY 10/20/23  Yes Grayce Sessions, NP   valsartan-hydrochlorothiazide (DIOVAN-HCT) 80-12.5 MG tablet Take 1 tablet by mouth daily. 06/12/22  Yes Grayce Sessions, NP    Family History Family History  Problem Relation Age of Onset   Diabetes Mother    Hypertension Mother    Alcohol abuse Mother    Diabetes Father    Kidney disease Father    Diabetes Sister    Stroke Sister    Diabetes Paternal Grandmother    Cancer Paternal Grandfather        type unknown   Colon cancer Neg Hx     Social History Social History   Tobacco Use   Smoking status: Former    Current packs/day: 0.00    Types: Cigarettes    Quit date: 12/03/2012    Years since quitting: 11.1   Smokeless tobacco: Never  Vaping Use   Vaping status: Never Used  Substance Use Topics   Alcohol use: Not Currently    Comment: Occasional   Drug use: Yes    Types: Marijuana    Comment: was addicted to cocaine for 23 yrs but clean now for about  12-13 yrs; last  marijuana  use was this mo rning      Allergies   Patient has no known allergies.   Review of Systems Review of Systems  Per HPI  Physical Exam Triage Vital Signs ED Triage Vitals  Encounter Vitals Group     BP 02/11/24 1319 125/86  Systolic BP Percentile --      Diastolic BP Percentile --      Pulse Rate 02/11/24 1319 85     Resp 02/11/24 1319 16     Temp 02/11/24 1319 98.2 F (36.8 C)     Temp Source 02/11/24 1319 Oral     SpO2 02/11/24 1319 95 %     Weight --      Height --      Head Circumference --      Peak Flow --      Pain Score 02/11/24 1318 0     Pain Loc --      Pain Education --      Exclude from Growth Chart --    No data found.  Updated Vital Signs BP 125/86 (BP Location: Right Arm)   Pulse 85   Temp 98.2 F (36.8 C) (Oral)   Resp 16   SpO2 95%   Visual Acuity Right Eye Distance:   Left Eye Distance:   Bilateral Distance:    Right Eye Near:   Left Eye Near:    Bilateral Near:     Physical Exam Vitals and nursing note reviewed.   Constitutional:      General: He is awake. He is not in acute distress.    Appearance: Normal appearance. He is well-developed and well-groomed. He is not ill-appearing.  Genitourinary:    Comments: Exam deferred. Neurological:     Mental Status: He is alert.  Psychiatric:        Behavior: Behavior is cooperative.      UC Treatments / Results  Labs (all labs ordered are listed, but only abnormal results are displayed) Labs Reviewed  CYTOLOGY, (ORAL, ANAL, URETHRAL) ANCILLARY ONLY    EKG   Radiology No results found.  Procedures Procedures (including critical care time)  Medications Ordered in UC Medications - No data to display  Initial Impression / Assessment and Plan / UC Course  I have reviewed the triage vital signs and the nursing notes.  Pertinent labs & imaging results that were available during my care of the patient were reviewed by me and considered in my medical decision making (see chart for details).     Patient presented requesting STD testing.  Denies any known exposures or symptoms.  GU exam deferred.  Patient perform self swab for STD.  Patient declines HIV and syphilis testing.  Discussed follow-up and return precautions. Final Clinical Impressions(s) / UC Diagnoses   Final diagnoses:  Screen for STD (sexually transmitted disease)     Discharge Instructions      Your results will come back over the next few days and someone will call if results are positive and require treatment.  Return here as needed.     ED Prescriptions   None    PDMP not reviewed this encounter.   Wynonia Lawman A, NP 02/11/24 1330

## 2024-02-11 NOTE — Discharge Instructions (Signed)
 Your results will come back over the next few days and someone will call if results are positive and require treatment.  Return here as needed.

## 2024-02-11 NOTE — ED Triage Notes (Signed)
Pt states he would like STI testing denies any sx. Pt states he would only like the Cyto no blood work.

## 2024-02-12 ENCOUNTER — Telehealth: Payer: Self-pay

## 2024-02-12 LAB — CYTOLOGY, (ORAL, ANAL, URETHRAL) ANCILLARY ONLY
Chlamydia: NEGATIVE
Comment: NEGATIVE
Comment: NEGATIVE
Comment: NORMAL
Neisseria Gonorrhea: NEGATIVE
Trichomonas: POSITIVE — AB

## 2024-02-12 MED ORDER — METRONIDAZOLE 500 MG PO TABS: 2000.0000 mg | ORAL_TABLET | Freq: Once | ORAL | 0 refills | Status: AC

## 2024-02-12 NOTE — Telephone Encounter (Signed)
Attempted to reach patient x1. Unable to LVM.  Per protocol, pt requires tx with metronidazole.  Rx sent to pharmacy on file.

## 2024-03-08 ENCOUNTER — Other Ambulatory Visit (INDEPENDENT_AMBULATORY_CARE_PROVIDER_SITE_OTHER): Payer: Self-pay | Admitting: Primary Care

## 2024-03-15 ENCOUNTER — Emergency Department (HOSPITAL_COMMUNITY)

## 2024-03-15 ENCOUNTER — Emergency Department (HOSPITAL_COMMUNITY)
Admission: EM | Admit: 2024-03-15 | Discharge: 2024-03-15 | Disposition: A | Discharge status: Home / Self Care | Attending: Emergency Medicine | Admitting: Emergency Medicine

## 2024-03-15 ENCOUNTER — Other Ambulatory Visit: Payer: Self-pay

## 2024-03-15 DIAGNOSIS — R109 Unspecified abdominal pain: Secondary | ICD-10-CM | POA: Diagnosis not present

## 2024-03-15 DIAGNOSIS — D134 Benign neoplasm of liver: Secondary | ICD-10-CM | POA: Insufficient documentation

## 2024-03-15 DIAGNOSIS — N281 Cyst of kidney, acquired: Secondary | ICD-10-CM | POA: Diagnosis not present

## 2024-03-15 DIAGNOSIS — Z905 Acquired absence of kidney: Secondary | ICD-10-CM | POA: Diagnosis not present

## 2024-03-15 DIAGNOSIS — R1011 Right upper quadrant pain: Secondary | ICD-10-CM

## 2024-03-15 DIAGNOSIS — K7689 Other specified diseases of liver: Secondary | ICD-10-CM | POA: Diagnosis not present

## 2024-03-15 DIAGNOSIS — R001 Bradycardia, unspecified: Secondary | ICD-10-CM | POA: Diagnosis not present

## 2024-03-15 DIAGNOSIS — K769 Liver disease, unspecified: Secondary | ICD-10-CM | POA: Diagnosis not present

## 2024-03-15 DIAGNOSIS — K429 Umbilical hernia without obstruction or gangrene: Secondary | ICD-10-CM | POA: Diagnosis not present

## 2024-03-15 LAB — CBC WITH DIFFERENTIAL/PLATELET
Abs Immature Granulocytes: 0.02 10*3/uL (ref 0.00–0.07)
Basophils Absolute: 0 10*3/uL (ref 0.0–0.1)
Basophils Relative: 1 %
Eosinophils Absolute: 0.5 10*3/uL (ref 0.0–0.5)
Eosinophils Relative: 10 %
HCT: 47.9 % (ref 39.0–52.0)
Hemoglobin: 15.6 g/dL (ref 13.0–17.0)
Immature Granulocytes: 0 %
Lymphocytes Relative: 32 %
Lymphs Abs: 1.5 10*3/uL (ref 0.7–4.0)
MCH: 30.6 pg (ref 26.0–34.0)
MCHC: 32.6 g/dL (ref 30.0–36.0)
MCV: 94.1 fL (ref 80.0–100.0)
Monocytes Absolute: 0.5 10*3/uL (ref 0.1–1.0)
Monocytes Relative: 10 %
Neutro Abs: 2.2 10*3/uL (ref 1.7–7.7)
Neutrophils Relative %: 47 %
Platelets: 332 10*3/uL (ref 150–400)
RBC: 5.09 MIL/uL (ref 4.22–5.81)
RDW: 14.2 % (ref 11.5–15.5)
WBC: 4.6 10*3/uL (ref 4.0–10.5)
nRBC: 0 % (ref 0.0–0.2)

## 2024-03-15 LAB — COMPREHENSIVE METABOLIC PANEL
ALT: 26 U/L (ref 0–44)
AST: 26 U/L (ref 15–41)
Albumin: 3.4 g/dL — ABNORMAL LOW (ref 3.5–5.0)
Alkaline Phosphatase: 78 U/L (ref 38–126)
Anion gap: 6 (ref 5–15)
BUN: 10 mg/dL (ref 8–23)
CO2: 24 mmol/L (ref 22–32)
Calcium: 8.5 mg/dL — ABNORMAL LOW (ref 8.9–10.3)
Chloride: 107 mmol/L (ref 98–111)
Creatinine, Ser: 1.43 mg/dL — ABNORMAL HIGH (ref 0.61–1.24)
GFR, Estimated: 56 mL/min — ABNORMAL LOW (ref >60)
Glucose, Bld: 98 mg/dL (ref 70–99)
Potassium: 4.3 mmol/L (ref 3.5–5.1)
Sodium: 137 mmol/L (ref 135–145)
Total Bilirubin: 0.8 mg/dL (ref 0.0–1.2)
Total Protein: 6.7 g/dL (ref 6.5–8.1)

## 2024-03-15 LAB — LIPASE, BLOOD: Lipase: 35 U/L (ref 11–51)

## 2024-03-15 LAB — TROPONIN I (HIGH SENSITIVITY): Troponin I (High Sensitivity): 4 ng/L (ref <18)

## 2024-03-15 MED ORDER — OMEPRAZOLE 20 MG PO CPDR: 20.0000 mg | DELAYED_RELEASE_CAPSULE | Freq: Every day | ORAL | 0 refills | Status: DC

## 2024-03-15 MED ORDER — ACETAMINOPHEN 500 MG PO TABS
1000.0000 mg | ORAL_TABLET | Freq: Once | ORAL | Status: AC
  Administered 2024-03-15: 1000 mg via ORAL
  Filled 2024-03-15: qty 2

## 2024-03-15 MED ORDER — ALUM & MAG HYDROXIDE-SIMETH 200-200-20 MG/5ML PO SUSP
15.0000 mL | Freq: Once | ORAL | Status: AC
  Administered 2024-03-15: 15 mL via ORAL
  Filled 2024-03-15: qty 30

## 2024-03-15 NOTE — Discharge Instructions (Signed)
 While you were in the emergency room, you have blood work done that was normal.  Your CT imaging and ultrasounds were also normal.  You do have some cysts in your liver, but they are not what we considered benign cysts.  Sometimes these can cause some discomfort, my suspicion is that may be the issue today.  I would like you to follow-up with your primary care doctor within 1 to 2 weeks to discuss the symptoms.

## 2024-03-15 NOTE — ED Notes (Signed)
 Phlebotomy to grab troponin.

## 2024-03-15 NOTE — ED Notes (Signed)
 PT otf with transport, appears stable at this time.

## 2024-03-15 NOTE — ED Provider Notes (Signed)
 [place] [facility] AT [facility] Provider Note   CSN: [id number] Arrival date & time: 03/15/24  7062     History  Chief Complaint  Patient presents with   Abdominal Pain    Garrett Clark is a 63 y.o. male.  63 year old male here today for intermittent right upper quadrant abdominal pain that wakes him from sleep.  Patient says that he has had these symptoms intermittently over the last 1 week, but he last experienced them 1 year ago.  Patient endorses nephrectomy due to renal cell carcinoma on the left side.  Eating, drinking, voiding and stooling as usual frequency.      Abdominal Pain      Home Medications Prior to Admission medications   Medication Sig Start Date End Date Taking? Authorizing Provider  amLODipine (NORVASC) 10 MG tablet Take by mouth. 08/31/23 08/30/24  [provider]  atorvastatin (LIPITOR) 20 MG tablet TAKE 1 TABLET(20 MG) BY MOUTH DAILY 03/08/24   Grayce Sessions, NP  cyclobenzaprine (FLEXERIL) 10 MG tablet TAKE 1 TABLET(10 MG) BY MOUTH THREE TIMES DAILY AS NEEDED FOR MUSCLE SPASMS 10/07/23   Magnant, Charles L, PA-C  FARXIGA 10 MG TABS tablet Take 10 mg by mouth every morning.    [provider]  gabapentin (NEURONTIN) 300 MG capsule TAKE 1 CAPSULE BY MOUTH TWICE DAILY 10/20/23   Grayce Sessions, NP  valsartan-hydrochlorothiazide (DIOVAN-HCT) 80-12.5 MG tablet Take 1 tablet by mouth daily. 06/12/22   Grayce Sessions, NP      Allergies    Patient has no known allergies.    Review of Systems   Review of Systems  Gastrointestinal:  Positive for abdominal pain.    Physical Exam Updated Vital Signs BP (!) 147/78   Pulse (!) 54   Temp 98.6 F (37 C) (Oral)   Resp 14   Ht 5\' 9"  (1.753 m)   Wt 87.3 kg   SpO2 96%   BMI 28.42 kg/m  Physical Exam Vitals reviewed.  HENT:     Head: Normocephalic.  Abdominal:     Palpations: Abdomen is soft.     Tenderness: There is no abdominal tenderness.  There is no guarding. Negative signs include Murphy's sign and McBurney's sign.     Hernia: No hernia is present.  Neurological:     General: No focal deficit present.     Mental Status: He is alert.     ED Results / Procedures / Treatments   Labs (all labs ordered are listed, but only abnormal results are displayed) Labs Reviewed  COMPREHENSIVE METABOLIC PANEL - Abnormal; Notable for the following components:      Result Value   Creatinine, Ser 1.43 (*)    Calcium 8.5 (*)    Albumin 3.4 (*)    GFR, Estimated 56 (*)    All other components within normal limits  CBC WITH DIFFERENTIAL/PLATELET  LIPASE, BLOOD  TROPONIN I (HIGH SENSITIVITY)    EKG None  Radiology CT ABDOMEN PELVIS WO CONTRAST Result Date: 03/15/2024 CLINICAL DATA:  Abdominal pain. EXAM: CT ABDOMEN AND PELVIS WITHOUT CONTRAST TECHNIQUE: Multidetector CT imaging of the abdomen and pelvis was performed following the standard protocol without IV contrast. RADIATION DOSE REDUCTION: This exam was performed according to the departmental dose-optimization program which includes automated exposure control, adjustment of the mA and/or kV according to patient size and/or use of iterative reconstruction technique. COMPARISON:  CT abdomen pelvis dated 11/15/2021. FINDINGS: Evaluation of this exam is limited in the  absence of intravenous contrast as well as due to respiratory motion. Lower chest: There is a 6 mm nodule in the right middle lobe stable since 2020. The visualized lung bases are otherwise clear. No intra-abdominal free air or free fluid. Hepatobiliary: Innumerable liver cysts and additional subcentimeter hypodense lesions which are too small to characterize. No biliary dilatation. The gallbladder is unremarkable Pancreas: Unremarkable. No pancreatic ductal dilatation or surrounding inflammatory changes. Spleen: Normal in size without focal abnormality. Adrenals/Urinary Tract: The adrenal glands are unremarkable. Status post  prior left nephrectomy. The right kidney, right ureter, and urinary bladder appear unremarkable. Stomach/Bowel: Evaluation of the bowel is limited in the absence of oral contrast. There is no bowel obstruction or active inflammation. The appendix is normal. Vascular/Lymphatic: Mild atherosclerotic calcification of the abdominal aorta. The IVC is unremarkable. No portal venous gas. There is no adenopathy. Reproductive: The prostate and seminal vesicles are grossly unremarkable. No pelvic mass. Other: Small fat containing umbilical hernia. Musculoskeletal: No acute or significant osseous findings. IMPRESSION: 1. No acute intra-abdominal or pelvic pathology. 2. Status post prior left nephrectomy. 3. Innumerable liver cysts. Electronically Signed   By: Elgie Collard M.D.   On: 03/15/2024 10:24   US Abdomen Limited RUQ (LIVER/GB) Result Date: 03/15/2024 CLINICAL DATA:  One-week history of abdominal pain EXAM: ULTRASOUND ABDOMEN LIMITED RIGHT UPPER QUADRANT COMPARISON:  None Available. FINDINGS: Gallbladder: No gallstones or wall thickening visualized. No sonographic Murphy sign noted by sonographer. Common bile duct: Diameter: 2 mm Liver: Multifocal hepatic cysts, measuring up to 2.0 cm. Portal vein is patent on color Doppler imaging with normal direction of blood flow towards the liver. Other: Incidentally noted right upper pole renal cyst measures 1.1 cm. IMPRESSION: No cholelithiasis or sonographic evidence of acute cholecystitis. Electronically Signed   By: Agustin Cree M.D.   On: 03/15/2024 10:23    Procedures Procedures    Medications Ordered in ED Medications - No data to display  ED Course/ Medical Decision Making/ A&P                                 Medical Decision Making 63 year old male who is here today for abdominal pain.  Plan-on exam, patient with soft abdomen, not currently experiencing any symptoms.  He says that he was going into work, began to experience severe right upper quadrant  pain.  Says that this happened 1 year previously.  Will obtain imaging to assess patient's gallbladder, lipase.  Also perform cardiac workup on the patient.  Overall patient looks well, normal vital signs.  Reviewed the patient's most recent PCP office note.  Reviewed the patient's medications with him at bedside.  1145 reassessment-patient's right upper quadrant ultrasound, CT imaging without acute process.  Patient does have multiple liver cysts could be contributing to his problems.  Will the patient follow-up with his PCP.  Troponins negative, LFTs normal.  Renal function at baseline.  Patient has had some bradycardia here in the emergency room, but perks back up when he is awake.  No indication for imaging in this patient.  Amount and/or Complexity of Data Reviewed Labs: ordered. Radiology: ordered.           Final Clinical Impression(s) / ED Diagnoses Final diagnoses:  Right upper quadrant abdominal pain  Benign liver cyst    Rx / DC Orders ED Discharge Orders     None         Arletha Pili,  DO 03/15/24 1147

## 2024-03-15 NOTE — ED Notes (Signed)
 Discharge paper work reviewed with pt. Pt is in no new onset distress at time of discharge.

## 2024-03-15 NOTE — ED Triage Notes (Signed)
 Pt here with RUQ abd sharp pain that wake him out of sleep. Pain has been going on for 1 week. Denies NVD. Pt is having regular BMS daily. Pt has history of same pain and was told it was gas. Pt has been taking tums with no improvement. Pain comes and goes and when pain is present it is 10/10. ABD nontender. Denies fever chills

## 2024-03-15 NOTE — ED Notes (Signed)
 Phlebotomy to grab labs.

## 2024-03-15 NOTE — ED Notes (Addendum)
 Pt Bradycardiac , while in and out of sleep into 40s, Stevens DO aware via face to face conversation.

## 2024-03-23 ENCOUNTER — Encounter (INDEPENDENT_AMBULATORY_CARE_PROVIDER_SITE_OTHER): Payer: Self-pay | Admitting: Primary Care

## 2024-03-23 ENCOUNTER — Ambulatory Visit (INDEPENDENT_AMBULATORY_CARE_PROVIDER_SITE_OTHER): Admitting: Primary Care

## 2024-03-23 ENCOUNTER — Ambulatory Visit: Payer: Self-pay

## 2024-03-23 VITALS — BP 112/76 | HR 78 | Wt 186.4 lb

## 2024-03-23 DIAGNOSIS — Z09 Encounter for follow-up examination after completed treatment for conditions other than malignant neoplasm: Secondary | ICD-10-CM

## 2024-03-23 DIAGNOSIS — R202 Paresthesia of skin: Secondary | ICD-10-CM | POA: Diagnosis not present

## 2024-03-23 DIAGNOSIS — K7689 Other specified diseases of liver: Secondary | ICD-10-CM

## 2024-03-23 DIAGNOSIS — M79602 Pain in left arm: Secondary | ICD-10-CM

## 2024-03-23 DIAGNOSIS — M79601 Pain in right arm: Secondary | ICD-10-CM

## 2024-03-23 MED ORDER — GABAPENTIN 300 MG PO CAPS: ORAL_CAPSULE | ORAL | 0 refills | Status: AC

## 2024-03-23 NOTE — Telephone Encounter (Signed)
 noted

## 2024-03-23 NOTE — Progress Notes (Signed)
 Subjective:   Garrett Clark is a 63 y.o. male presents for ED follow up.  Patient presented on 03/15/24, with severe right upper quadrant pain.  He was on the way to work however became bent over in severe pain and needed to be seen emergently.  Right upper quadrant ultrasound/CT imaging shows multiple liver cysts which is contributing to the abdominal pain.  Patient will be referred to nephrology.   Past Medical History:  Diagnosis Date   Cancer (HCC)    Left kidney   Hyperlipidemia    Internal hemorrhoids    Polycystic liver disease    Substance abuse (HCC)      No Known Allergies  Current Outpatient Medications on File Prior to Visit  Medication Sig Dispense Refill   amLODipine (NORVASC) 10 MG tablet Take by mouth.     atorvastatin (LIPITOR) 20 MG tablet TAKE 1 TABLET(20 MG) BY MOUTH DAILY 90 tablet 1   cyclobenzaprine (FLEXERIL) 10 MG tablet TAKE 1 TABLET(10 MG) BY MOUTH THREE TIMES DAILY AS NEEDED FOR MUSCLE SPASMS 90 tablet 1   FARXIGA 10 MG TABS tablet Take 10 mg by mouth every morning.     gabapentin (NEURONTIN) 300 MG capsule TAKE 1 CAPSULE BY MOUTH TWICE DAILY 180 capsule 0   omeprazole (PRILOSEC) 20 MG capsule Take 1 capsule (20 mg total) by mouth daily. 30 capsule 0   valsartan-hydrochlorothiazide (DIOVAN-HCT) 80-12.5 MG tablet Take 1 tablet by mouth daily. 90 tablet 3   No current facility-administered medications on file prior to visit.    Review of System:  Comprehensive ROS Pertinent positive and negative noted in HPI   Objective:  BP 112/76 (BP Location: Right Arm, Patient Position: Sitting, Cuff Size: Normal)   Pulse 78   Wt 186 lb 6.4 oz (84.6 kg)   SpO2 96%   BMI 27.53 kg/m   Filed Weights   03/23/24 1523  Weight: 186 lb 6.4 oz (84.6 kg)    Physical Exam Vitals reviewed.  HENT:     Head: Normocephalic.     Right Ear: External ear normal.     Left Ear: External ear normal.     Nose: Nose normal.  Cardiovascular:     Rate and Rhythm: Normal  rate and regular rhythm.  Pulmonary:     Effort: Pulmonary effort is normal.     Breath sounds: Normal breath sounds.  Abdominal:     General: Bowel sounds are normal. There is distension.     Palpations: Abdomen is soft.  Musculoskeletal:        General: Normal range of motion.  Skin:    General: Skin is warm and dry.  Neurological:     Mental Status: He is oriented to person, place, and time.  Psychiatric:        Mood and Affect: Mood normal.        Behavior: Behavior normal.        Thought Content: Thought content normal.        Judgment: Judgment normal.      Assessment:  Garrett Clark" was seen today for hospitalization follow-up.  Diagnoses and all orders for this visit:  Hospital discharge follow-up See HPI  Liver cyst -     Ambulatory referral to Nephrology   Paresthesia and pain of both upper extremities  Started on gabapentin 100 mg 3 times daily Referred back to Ortho history of  lumbar radiculopathy     This note has been created with Dragon speech recognition software  and smart Lobbyist. Any transcriptional errors are unintentional.   Keep schedule appt  Grayce Sessions, NP 03/23/2024, 3:35 PM

## 2024-03-23 NOTE — Telephone Encounter (Signed)
 Chief Complaint: abd pain Symptoms: pain Frequency: year Pertinent Negatives: Patient denies fever, N/V/D Disposition: [] ED /[] Urgent Care (no appt availability in office) / [x] Appointment(In office/virtual)/ []  [facility]/ [] Home Care/ [] Refused Recommended Disposition /[] [facility]/ []  Follow-up with PCP Additional Notes: Pt states was seen in ED and told he had "sickle cell spots in the liver". Pt states that he was told to see his pain. Pt states that pain has gotten better, f/u and scheduling a pcp appt. Pt states pain has gotten better since starting the medication from the ED. Pt scheduled today for ED f/u. Copied from CRM [id number]. Topic: Clinical - Red Word Triage >> Mar 23, 2024 12:03 PM Dondra Prader E wrote: Red Word that prompted transfer to Nurse Triage: Abdominal pain Reason for Disposition  [1] MODERATE pain (e.g., interferes with normal activities) AND [2] pain comes and goes (cramps) AND [3] present > 24 hours  (Exception: Pain with Vomiting or Diarrhea - see that Guideline.)  Answer Assessment - Initial Assessment Questions 1. LOCATION: "Where does it hurt?"      Middle about belly button, over to R side 3. ONSET: "When did the pain begin?" (Minutes, hours or days ago)      weeks 4. SUDDEN: "Gradual or sudden onset?"     sudden 5. PATTERN "Does the pain come and go, or is it constant?"    - If it comes and goes: "How long does it last?" "Do you have pain now?"     (Note: Comes and goes means the pain is intermittent. It goes away completely between bouts.)    - If constant: "Is it getting better, staying the same, or getting worse?"      (Note: Constant means the pain never goes away completely; most serious pain is constant and gets worse.)      intermittent 6. SEVERITY: "How bad is the pain?"  (e.g., Scale 1-10; mild, moderate, or severe)    - MILD (1-3): Doesn't interfere with normal activities, abdomen soft and not tender to touch.     - MODERATE  (4-7): Interferes with normal activities or awakens from sleep, abdomen tender to touch.     - SEVERE (8-10): Excruciating pain, doubled over, unable to do any normal activities.       5 7. RECURRENT SYMPTOM: "Have you ever had this type of stomach pain before?" If Yes, ask: "When was the last time?" and "What happened that time?"      denies 8. CAUSE: "What do you think is causing the stomach pain?"     unsure 9. RELIEVING/AGGRAVATING FACTORS: "What makes it better or worse?" (e.g., antacids, bending or twisting motion, bowel movement)     Medication ED gave pt helps with pain 10. OTHER SYMPTOMS: "Do you have any other symptoms?" (e.g., back pain, diarrhea, fever, urination pain, vomiting)       denies  Protocols used: Abdominal Pain - Male-A-AH

## 2024-03-29 ENCOUNTER — Telehealth (INDEPENDENT_AMBULATORY_CARE_PROVIDER_SITE_OTHER): Payer: Self-pay | Admitting: Primary Care

## 2024-03-29 NOTE — Telephone Encounter (Signed)
 Copied from CRM [id number]. Topic: Referral - Request for Referral >> Mar 29, 2024 12:23 PM Elle L wrote: Did the patient discuss referral with their provider in the last year? Yes  Appointment offered? Yes  Type of order/referral and detailed reason for visit: The patient states he needs a referral for a liver specialist per his last appointment with NP Gwinda Passe. He states he received a referral for Orthopedic Surgery but he is unsure why he received this.   Preference of office, provider, location: No preference as long as it's in [place].   If referral order, have you been seen by this specialty before? No  Can we respond through MyChart? No, the patient's call back number is [phone].

## 2024-03-30 ENCOUNTER — Telehealth (INDEPENDENT_AMBULATORY_CARE_PROVIDER_SITE_OTHER): Payer: Self-pay | Admitting: Primary Care

## 2024-03-30 NOTE — Telephone Encounter (Signed)
(  1st Attempt) to call pt and give referral information. Pt's phone number could not be reached at this time.

## 2024-03-30 NOTE — Telephone Encounter (Signed)
 [facility] referral has already been placed. Reach out to pt to go over referral  information

## 2024-04-13 ENCOUNTER — Telehealth (INDEPENDENT_AMBULATORY_CARE_PROVIDER_SITE_OTHER): Payer: Self-pay | Admitting: Primary Care

## 2024-04-13 NOTE — Telephone Encounter (Signed)
 Pt called stating the wrong referral information was sent in on his behalf. I called pt to confirm upcoming appt and pt mentioned that we never got back with him on the correct referral appt to the Hepatologist. Pt is still wanting a referral to liver specialist.

## 2024-04-18 DIAGNOSIS — R809 Proteinuria, unspecified: Secondary | ICD-10-CM | POA: Diagnosis not present

## 2024-04-18 DIAGNOSIS — I129 Hypertensive chronic kidney disease with stage 1 through stage 4 chronic kidney disease, or unspecified chronic kidney disease: Secondary | ICD-10-CM | POA: Diagnosis not present

## 2024-04-18 DIAGNOSIS — E785 Hyperlipidemia, unspecified: Secondary | ICD-10-CM | POA: Diagnosis not present

## 2024-04-18 DIAGNOSIS — N1832 Chronic kidney disease, stage 3b: Secondary | ICD-10-CM | POA: Diagnosis not present

## 2024-04-20 ENCOUNTER — Ambulatory Visit (INDEPENDENT_AMBULATORY_CARE_PROVIDER_SITE_OTHER): Payer: Medicaid Other | Admitting: Primary Care

## 2024-04-20 ENCOUNTER — Encounter (INDEPENDENT_AMBULATORY_CARE_PROVIDER_SITE_OTHER): Payer: Self-pay | Admitting: Primary Care

## 2024-04-20 ENCOUNTER — Other Ambulatory Visit (HOSPITAL_COMMUNITY)
Admission: RE | Admit: 2024-04-20 | Discharge: 2024-04-20 | Disposition: A | Discharge status: Home / Self Care | Source: Ambulatory Visit | Attending: Primary Care | Admitting: Primary Care

## 2024-04-20 VITALS — BP 127/80 | HR 53 | Resp 16 | Wt 185.8 lb

## 2024-04-20 DIAGNOSIS — Z202 Contact with and (suspected) exposure to infections with a predominantly sexual mode of transmission: Secondary | ICD-10-CM

## 2024-04-20 NOTE — Progress Notes (Signed)
 [facility]   CC: STI Exposure  SUBJECTIVE:  Garrett Clark is a 63 y.o. male who presents with complaints penial discharge that began 1 week ago.  Patient did precipitating recent sexual encounter male informed him she had a STI Patient is sexually active with 2 male partners.  Describes discharge white. Change in urination intermittent burning  Patient denies fever, chills, nausea, vomiting, abdominal or pelvic pain, urinary symptoms, genital itching, odor and/or bleeding, dyspareunia rashes or lesions.  ROS: As per HPI.  All other pertinent ROS negative.     Past Medical History:  Diagnosis Date   Cancer (HCC)    Left kidney   Hyperlipidemia    Internal hemorrhoids    Polycystic liver disease    Substance abuse (HCC)    Past Surgical History:  Procedure Laterality Date   COLONOSCOPY     HEMORRHOID BANDING     ROBOT ASSISTED LAPAROSCOPIC NEPHRECTOMY Left 12/07/2017   Procedure: XI ROBOTIC ASSISTED LAPAROSCOPIC NEPHRECTOMY;  Surgeon: Marco Severs, MD;  Location: [facility];  Service: Urology;  Laterality: Left;   No Known Allergies Current Outpatient Medications on File Prior to Visit  Medication Sig Dispense Refill   atorvastatin  (LIPITOR) 20 MG tablet TAKE 1 TABLET(20 MG) BY MOUTH DAILY 90 tablet 1   cyclobenzaprine  (FLEXERIL ) 10 MG tablet TAKE 1 TABLET(10 MG) BY MOUTH THREE TIMES DAILY AS NEEDED FOR MUSCLE SPASMS 90 tablet 1   FARXIGA 10 MG TABS tablet Take 10 mg by mouth every morning.     gabapentin  (NEURONTIN ) 300 MG capsule TAKE 1 CAPSULE BY MOUTH TWICE DAILY 180 capsule 0   omeprazole  (PRILOSEC) 20 MG capsule Take 1 capsule (20 mg total) by mouth daily. 30 capsule 0   valsartan -hydrochlorothiazide  (DIOVAN -HCT) 80-12.5 MG tablet Take 1 tablet by mouth daily. 90 tablet 3   No current facility-administered medications on file prior to visit.    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  Has 2 children.  Works at [employer].  Education: high school.    Social Drivers of Corporate investment banker Strain: Not on file  Food Insecurity: No Food Insecurity (04/20/2024)   Hunger Vital Sign    Worried About Running Out of Food in the Last Year: Never true    Ran Out of Food in the Last Year: Never true  Recent Concern: Food Insecurity - Food Insecurity Present (01/21/2024)   Hunger Vital Sign    Worried About Running Out of  Food in the Last Year: Sometimes true    Ran Out of Food in the Last Year: Sometimes true  Transportation Needs: No Transportation Needs (04/20/2024)   PRAPARE - Administrator, Civil Service (Medical): No    Lack of Transportation (Non-Medical): No  Physical Activity: Not on file  Stress: Not on file  Social Connections: Not on file  Intimate Partner Violence: Not At Risk (04/20/2024)   Humiliation, Afraid, Rape, and Kick questionnaire    Fear of Current or Ex-Partner: No    Emotionally Abused: No    Physically Abused: No    Sexually Abused: No   Family History  Problem Relation Age of Onset   Diabetes Mother    Hypertension Mother    Alcohol abuse Mother    Diabetes Father    Kidney  disease Father    Diabetes Sister    Stroke Sister    Diabetes Paternal Grandmother    Cancer Paternal Grandfather        type unknown   Colon cancer Neg Hx     OBJECTIVE:  Vitals:   04/20/24 1112  BP: 127/80  Pulse: (!) 53  Resp: 16  SpO2: 99%  Weight: 185 lb 12.8 oz (84.3 kg)     General appearance: Alert, NAD, appears stated age Head: NCAT Throat: lips, mucosa, and tongue normal; teeth and gums normal Lungs: CTA bilaterally without adventitious breath sounds Heart: regular rate and rhythm.  Radial pulses 2+ symmetrical bilaterally Back: no CVA tenderness Abdomen: soft, non-tender; bowel sounds normal; no masses or organomegaly; no guarding or rebound tenderness GU: declines Penial swab obtained and urine collected.  Skin: warm and dry Psychological:  Alert and cooperative. Normal mood and affect.  LABS:  Results for orders placed or performed during the hospital encounter of 03/15/24  Comprehensive metabolic panel   Collection Time: 03/15/24  9:20 AM  Result Value Ref Range   Sodium 137 135 - 145 mmol/L   Potassium 4.3 3.5 - 5.1 mmol/L   Chloride 107 98 - 111 mmol/L   CO2 24 22 - 32 mmol/L   Glucose, Bld 98 70 - 99 mg/dL   BUN 10 8 - 23 mg/dL   Creatinine, Ser 4.09 (H) 0.61 - 1.24 mg/dL   Calcium  8.5 (L) 8.9 - 10.3 mg/dL   Total Protein 6.7 6.5 - 8.1 g/dL   Albumin 3.4 (L) 3.5 - 5.0 g/dL   AST 26 15 - 41 U/L   ALT 26 0 - 44 U/L   Alkaline Phosphatase 78 38 - 126 U/L   Total Bilirubin 0.8 0.0 - 1.2 mg/dL   GFR, Estimated 56 (L) >60 mL/min   Anion gap 6 5 - 15  CBC with Differential   Collection Time: 03/15/24  9:20 AM  Result Value Ref Range   WBC 4.6 4.0 - 10.5 K/uL   RBC 5.09 4.22 - 5.81 MIL/uL   Hemoglobin 15.6 13.0 - 17.0 g/dL   HCT 81.1 91.4 - 78.2 %   MCV 94.1 80.0 - 100.0 fL   MCH 30.6 26.0 - 34.0 pg   MCHC 32.6 30.0 - 36.0 g/dL   RDW 95.6 21.3 - 08.6 %   Platelets 332 150 - 400 K/uL   nRBC 0.0 0.0 - 0.2 %   Neutrophils Relative % 47 %    Neutro Abs 2.2 1.7 - 7.7 K/uL   Lymphocytes Relative 32 %   Lymphs Abs 1.5 0.7 - 4.0 K/uL   Monocytes Relative 10 %  Monocytes Absolute 0.5 0.1 - 1.0 K/uL   Eosinophils Relative 10 %   Eosinophils Absolute 0.5 0.0 - 0.5 K/uL   Basophils Relative 1 %   Basophils Absolute 0.0 0.0 - 0.1 K/uL   Immature Granulocytes 0 %   Abs Immature Granulocytes 0.02 0.00 - 0.07 K/uL  Lipase, blood   Collection Time: 03/15/24  9:20 AM  Result Value Ref Range   Lipase 35 11 - 51 U/L  Troponin I (High Sensitivity)   Collection Time: 03/15/24  9:20 AM  Result Value Ref Range   Troponin I (High Sensitivity) 4 <18 ng/L    @EDLABS @  ASSESSMENT & PLAN: Diagnoses and all orders for this visit:  Exposure to sexually transmitted disease (STD) -     Urine cytology ancillary only  If tests results are positive, please abstain from sexual activity until you and your partner(s) have been treated Follow up with PCP or [facility] if symptoms persists Return here or go to ER if you have any new or worsening symptoms fever, chills, nausea, vomiting, abdominal or pelvic pain, painful intercourse, vaginal discharge, vaginal bleeding, persistent symptoms despite treatment, etc...  Reviewed expectations re: course of current medical issues. Questions answered. Outlined signs and symptoms indicating need for more acute intervention. Patient verbalized understanding. After Visit Summary given.   Madelyn Schick, NP 04/20/2024 11:31 AM

## 2024-04-21 LAB — URINE CYTOLOGY ANCILLARY ONLY
Chlamydia: NEGATIVE
Comment: NEGATIVE
Comment: NEGATIVE
Comment: NORMAL
Neisseria Gonorrhea: NEGATIVE
Trichomonas: NEGATIVE

## 2024-04-24 ENCOUNTER — Encounter: Payer: Self-pay | Admitting: Nurse Practitioner

## 2024-04-25 ENCOUNTER — Telehealth (INDEPENDENT_AMBULATORY_CARE_PROVIDER_SITE_OTHER): Payer: Self-pay

## 2024-04-25 NOTE — Telephone Encounter (Signed)
 Copied from CRM [id number]. Topic: Clinical - Lab/Test Results >> Apr 22, 2024  4:54 PM Garrett Clark wrote: Reason for CRM: Patient calling for test results. Would like a call back once results are available.

## 2024-04-25 NOTE — Telephone Encounter (Signed)
 Returned pt call. Pt states he was unable to log in to MyChart. Went over lab results with pt. Pt doesn't have any questions or concerns

## 2024-05-04 ENCOUNTER — Other Ambulatory Visit: Payer: Self-pay | Admitting: Surgical

## 2024-05-04 DIAGNOSIS — M5441 Lumbago with sciatica, right side: Secondary | ICD-10-CM

## 2024-05-05 ENCOUNTER — Telehealth: Payer: Self-pay | Admitting: Physical Medicine and Rehabilitation

## 2024-05-05 NOTE — Telephone Encounter (Signed)
 Patient called. Says the pain has returned in his neck and back. Would like an injection.

## 2024-05-06 ENCOUNTER — Other Ambulatory Visit: Payer: Self-pay | Admitting: Physical Medicine and Rehabilitation

## 2024-05-06 DIAGNOSIS — M5416 Radiculopathy, lumbar region: Secondary | ICD-10-CM

## 2024-05-22 ENCOUNTER — Encounter (HOSPITAL_COMMUNITY): Payer: Self-pay

## 2024-05-22 ENCOUNTER — Ambulatory Visit (INDEPENDENT_AMBULATORY_CARE_PROVIDER_SITE_OTHER)

## 2024-05-22 ENCOUNTER — Ambulatory Visit (HOSPITAL_COMMUNITY)
Admission: EM | Admit: 2024-05-22 | Discharge: 2024-05-22 | Disposition: A | Discharge status: Home / Self Care | Attending: Internal Medicine | Admitting: Internal Medicine

## 2024-05-22 DIAGNOSIS — M25521 Pain in right elbow: Secondary | ICD-10-CM

## 2024-05-22 MED ORDER — PREDNISONE 20 MG PO TABS: 40.0000 mg | ORAL_TABLET | Freq: Every day | ORAL | 0 refills | Status: AC

## 2024-05-22 NOTE — ED Provider Notes (Signed)
 [facility]    CSN: [id number] Arrival date & time: 05/22/24  1150      History   Chief Complaint Chief Complaint  Patient presents with   Elbow Pain    HPI Garrett Clark is a 63 y.o. male.   63 year old male who presents urgent care with complaints of right elbow pain.  He reports that this started 3 days ago.  He has had similar pain in the past that is recurrent in this elbow but never this severe.  He denies any specific injury to the area.  He is able to grip his hand.  The pain does radiate from his elbow down to his hand.  He is a left-handed.  He does have an orthopedist for his back and knees but has never talked to them about his elbow.  He reports that he has not tried any medication in the past or any treatments for his elbow.  He has not had any x-rays done on his elbow.  He relates that he did use to workout and do pull-ups a fair amount but had to stop due to his elbow starting to hurt.  He does relate that he has a solitary kidney and his last creatinine in April was 1.51.  He reports that this limits what medication he is allowed to take.       Past Medical History:  Diagnosis Date   Cancer [facility])    Left kidney   Hyperlipidemia    Internal hemorrhoids    Polycystic liver disease    Substance abuse [facility])     Patient Active Problem List   Diagnosis Date Noted   Stage 3 chronic kidney disease (HCC) 08/10/2023   GERD (gastroesophageal reflux disease) 01/13/2019   History of left radical nephrectomy 01/13/2019   Renal cell carcinoma of left kidney (HCC) 12/07/2017   Prolapsed internal hemorrhoids, grade 2 06/19/2017   Pure hypercholesterolemia 03/30/2017   History of cocaine abuse (HCC) 03/23/2017    Past Surgical History:  Procedure Laterality Date   COLONOSCOPY     HEMORRHOID BANDING     ROBOT ASSISTED LAPAROSCOPIC NEPHRECTOMY Left 12/07/2017   Procedure: XI ROBOTIC ASSISTED LAPAROSCOPIC NEPHRECTOMY;  Surgeon: Marco Severs, MD;   Location: [facility];  Service: Urology;  Laterality: Left;       Home Medications    Prior to Admission medications   Medication Sig Start Date End Date Taking? Authorizing Provider  atorvastatin  (LIPITOR) 20 MG tablet TAKE 1 TABLET(20 MG) BY MOUTH DAILY 03/08/24   Marius Siemens, NP  cyclobenzaprine  (FLEXERIL ) 10 MG tablet TAKE 1 TABLET(10 MG) BY MOUTH THREE TIMES DAILY AS NEEDED FOR MUSCLE SPASMS 05/04/24   Magnant, Charles L, PA-C  FARXIGA 10 MG TABS tablet Take 10 mg by mouth every morning.    [provider]  gabapentin  (NEURONTIN ) 300 MG capsule TAKE 1 CAPSULE BY MOUTH TWICE DAILY 03/23/24   Marius Siemens, NP  omeprazole  (PRILOSEC) 20 MG capsule Take 1 capsule (20 mg total) by mouth daily. 03/15/24 04/14/24  Afton Horse T, DO  valsartan -hydrochlorothiazide  (DIOVAN -HCT) 80-12.5 MG tablet Take 1 tablet by mouth daily. 06/12/22   Marius Siemens, NP    Family History Family History  Problem Relation Age of Onset   Diabetes Mother    Hypertension Mother    Alcohol abuse Mother    Diabetes Father    Kidney disease Father    Diabetes Sister    Stroke Sister    Diabetes Paternal  Grandmother    Cancer Paternal Grandfather        type unknown   Colon cancer Neg Hx     Social History Social History   Tobacco Use   Smoking status: Former    Current packs/day: 0.00    Types: Cigarettes    Quit date: 12/03/2012    Years since quitting: 11.4   Smokeless tobacco: Never  Vaping Use   Vaping status: Never Used  Substance Use Topics   Alcohol use: Not Currently    Comment: Occasional   Drug use: Yes    Types: Marijuana    Comment: was addicted to cocaine for 23 yrs but clean now for about  12-13 yrs; last  marijuana  use was this mo rning      Allergies   Patient has no known allergies.   Review of Systems Review of Systems  Constitutional:  Negative for chills, fatigue and fever.  HENT:  Negative for ear pain and sore throat.   Eyes:  Negative for  pain and visual disturbance.  Respiratory:  Negative for cough and shortness of breath.   Cardiovascular:  Negative for chest pain and palpitations.  Gastrointestinal:  Negative for abdominal pain and vomiting.  Genitourinary:  Negative for dysuria and hematuria.  Musculoskeletal:  Positive for joint swelling. Negative for arthralgias and back pain.       Right elbow pain  Skin:  Negative for color change and rash.  Neurological:  Negative for seizures and syncope.  All other systems reviewed and are negative.    Physical Exam Triage Vital Signs ED Triage Vitals [05/22/24 1210]  Encounter Vitals Group     BP 105/67     Systolic BP Percentile      Diastolic BP Percentile      Pulse Rate (!) 104     Resp 14     Temp 99.8 F (37.7 C)     Temp Source Oral     SpO2 92 %     Weight      Height      Head Circumference      Peak Flow      Pain Score 9     Pain Loc      Pain Education      Exclude from Growth Chart    No data found.  Updated Vital Signs BP 105/67 (BP Location: Left Arm)   Pulse (!) 104   Temp 99.8 F (37.7 C) (Oral)   Resp 14   SpO2 92%   Visual Acuity Right Eye Distance:   Left Eye Distance:   Bilateral Distance:    Right Eye Near:   Left Eye Near:    Bilateral Near:     Physical Exam Vitals and nursing note reviewed.  Constitutional:      General: He is not in acute distress.    Appearance: He is well-developed.  HENT:     Head: Normocephalic and atraumatic.  Eyes:     Conjunctiva/sclera: Conjunctivae normal.  Cardiovascular:     Rate and Rhythm: Normal rate and regular rhythm.     Heart sounds: No murmur heard. Pulmonary:     Effort: Pulmonary effort is normal. No respiratory distress.     Breath sounds: Normal breath sounds.  Abdominal:     Palpations: Abdomen is soft.     Tenderness: There is no abdominal tenderness.  Musculoskeletal:        General: No swelling.     Right elbow: Swelling  present. Decreased range of motion.  Tenderness present in lateral epicondyle.     Left elbow: Swelling present.     Right hand: Normal capillary refill. Normal pulse.     Left hand: Normal capillary refill. Normal pulse.     Cervical back: Neck supple.     Comments: Swelling along the lateral aspect of both elbows, decreased range of motion with extension of the right arm.  Skin:    General: Skin is warm and dry.     Capillary Refill: Capillary refill takes less than 2 seconds.  Neurological:     Mental Status: He is alert.  Psychiatric:        Mood and Affect: Mood normal.      UC Treatments / Results  Labs (all labs ordered are listed, but only abnormal results are displayed) Labs Reviewed - No data to display  EKG   Radiology DG Elbow Complete Right Result Date: 05/22/2024 CLINICAL DATA:  Elbow pain with no history of injury. EXAM: RIGHT ELBOW - COMPLETE 3+ VIEW COMPARISON:  None Available. FINDINGS: Limited study due to oblique positioning on the lateral film. Within this limitation there is no evidence for an acute fracture or dislocation. No fat pad elevation to suggest joint effusion. IMPRESSION: Negative. Electronically Signed   By: Donnal Fusi M.D.   On: 05/22/2024 13:10    Procedures Procedures (including critical care time)  Medications Ordered in UC Medications - No data to display  Initial Impression / Assessment and Plan / UC Course  I have reviewed the triage vital signs and the nursing notes.  Pertinent labs & imaging results that were available during my care of the patient were reviewed by me and considered in my medical decision making (see chart for details).     Right elbow pain - Plan: DG Elbow Complete Right, DG Elbow Complete Right   X-ray of the right elbow done today.  Final evaluation by the radiologist is still pending but brief evaluation shows no evidence of acute fracture however there is some calcification of the tendons possibly consistent with arthritic changes.  As this  is a recurrent issue, this is likely some aspect of tendinitis versus arthritis.  We will put the patient in an Ace wrap for comfort and we will do a short course of steroids given that he is scheduled to follow-up with his orthopedist June 4.  At that appointment we have recommended that he discuss his recurrent right elbow symptoms with them to see if further treatment or imaging might be needed.  Recommend reducing activity with the right arm for the next 2 to 3 days then slowly increase again.  May follow-up at urgent care as needed.  Final Clinical Impressions(s) / UC Diagnoses   Final diagnoses:  Right elbow pain     Discharge Instructions      X-ray of the right elbow done today.  Final evaluation by the radiologist is still pending but on brief evaluation there does not appear to be any acute fracture.  There does appear to be some mild changes that may suggest arthritis.  We can treat with a short course of steroids since you are scheduled for follow-up with your orthopedist on June 4.  Recommend at that follow-up discussing the right elbow recurrent symptoms. Treatment as follows:  Prednisone  40 mg (2 tablets) once daily for 5 days. Take this in the morning.  This is a steroid to help with inflammation and pain.  Ace wrap from one  third of the forearm to one third of the upper arm and a snug wrap.  Apply this when you are doing activity with your arm.  If you get numbness, tingling or pain into the hand that is significant then remove the Ace wrap immediately. Advised reducing activity with the right arm for the next 2 to 3 days then slowly increase. At your follow-up appointment with orthopedics this week, discussed the recurrent right elbow pain. Return to urgent care or PCP if symptoms worsen or fail to resolve.     ED Prescriptions   None    PDMP not reviewed this encounter.   Kreg Pesa, [place] 05/22/24 1328

## 2024-05-22 NOTE — Discharge Instructions (Addendum)
 X-ray of the right elbow done today.  Final evaluation by the radiologist is still pending but on brief evaluation there does not appear to be any acute fracture.  There does appear to be some mild changes that may suggest arthritis.  We can treat with a short course of steroids since you are scheduled for follow-up with your orthopedist on June 4.  Recommend at that follow-up discussing the right elbow recurrent symptoms. Treatment as follows:  Prednisone  40 mg (2 tablets) once daily for 5 days. Take this in the morning.  This is a steroid to help with inflammation and pain.  Ace wrap from one third of the forearm to one third of the upper arm and a snug wrap.  Apply this when you are doing activity with your arm.  If you get numbness, tingling or pain into the hand that is significant then remove the Ace wrap immediately. Advised reducing activity with the right arm for the next 2 to 3 days then slowly increase. At your follow-up appointment with orthopedics this week, discussed the recurrent right elbow pain. Return to urgent care or PCP if symptoms worsen or fail to resolve.

## 2024-05-22 NOTE — ED Triage Notes (Signed)
 Patient reports that he began having right elbow pain that radiates down to his right hand x 3 days. Patient denies any injury.   Patient denies taking any medication for his pain.

## 2024-05-23 DIAGNOSIS — N1832 Chronic kidney disease, stage 3b: Secondary | ICD-10-CM | POA: Diagnosis not present

## 2024-05-23 DIAGNOSIS — R809 Proteinuria, unspecified: Secondary | ICD-10-CM | POA: Diagnosis not present

## 2024-05-23 DIAGNOSIS — I129 Hypertensive chronic kidney disease with stage 1 through stage 4 chronic kidney disease, or unspecified chronic kidney disease: Secondary | ICD-10-CM | POA: Diagnosis not present

## 2024-05-23 DIAGNOSIS — E785 Hyperlipidemia, unspecified: Secondary | ICD-10-CM | POA: Diagnosis not present

## 2024-05-25 ENCOUNTER — Ambulatory Visit: Admitting: Physical Medicine and Rehabilitation

## 2024-05-25 ENCOUNTER — Other Ambulatory Visit: Payer: Self-pay

## 2024-05-25 VITALS — BP 119/79 | HR 90

## 2024-05-25 DIAGNOSIS — M5416 Radiculopathy, lumbar region: Secondary | ICD-10-CM | POA: Diagnosis not present

## 2024-05-25 MED ORDER — METHYLPREDNISOLONE ACETATE 40 MG/ML IJ SUSP
40.0000 mg | Freq: Once | INTRAMUSCULAR | Status: AC
Start: 2024-05-25 — End: 2024-05-25
  Administered 2024-05-25: 40 mg

## 2024-05-25 NOTE — Progress Notes (Signed)
 Pain Scale   Average Pain 6 Patient advising his lower back pain increases after he get off work and sits down. Patient advises that after he sits he has a increase of pain.        +Driver, -BT, -Dye Allergies.

## 2024-05-25 NOTE — Patient Instructions (Signed)

## 2024-05-25 NOTE — Procedures (Signed)
 Lumbar Epidural Steroid Injection - Interlaminar Approach with Fluoroscopic Guidance  Patient: Garrett Clark      Date of Birth: 09/06/1961 MRN: [id number] PCP: Marius Siemens, NP      Visit Date: 05/25/2024   Universal Protocol:     Consent Given By: the patient  Position: PRONE  Additional Comments: Vital signs were monitored before and after the procedure. Patient was prepped and draped in the usual sterile fashion. The correct patient, procedure, and site was verified.   Injection Procedure Details:   Procedure diagnoses: Lumbar radiculopathy [M54.16]   Meds Administered:  Meds ordered this encounter  Medications   methylPREDNISolone  acetate (DEPO-MEDROL ) injection 40 mg     Laterality: Right  Location/Site:  L5-S1  Needle: 3.5 in., 20 ga. Tuohy  Needle Placement: Paramedian epidural  Findings:   -Comments: Excellent flow of contrast into the epidural space.  Procedure Details: Using a paramedian approach from the side mentioned above, the region overlying the inferior lamina was localized under fluoroscopic visualization and the soft tissues overlying this structure were infiltrated with 4 ml. of 1% Lidocaine  without Epinephrine. The Tuohy needle was inserted into the epidural space using a paramedian approach.   The epidural space was localized using loss of resistance along with counter oblique bi-planar fluoroscopic views.  After negative aspirate for air, blood, and CSF, a 2 ml. volume of Isovue -250 was injected into the epidural space and the flow of contrast was observed. Radiographs were obtained for documentation purposes.    The injectate was administered into the level noted above.   Additional Comments:  The patient tolerated the procedure well Dressing: 2 x 2 sterile gauze and Band-Aid    Post-procedure details: Patient was observed during the procedure. Post-procedure instructions were reviewed.  Patient left the clinic in stable  condition.

## 2024-05-25 NOTE — Progress Notes (Signed)
 Garrett Clark - 63 y.o. male MRN [id number]  Date of birth: November 08, 1961  Office Visit Note: Visit Date: 05/25/2024 PCP: Marius Siemens, NP Referred by: Diedra Fowler, MD  Subjective: Chief Complaint  Patient presents with   Lower Back - Pain   HPI:  Garrett Clark is a 63 y.o. male who comes in today at the request of Elvan Hamel, FNP and Dr. Colette Davies for planned Right L5-S1 Lumbar Interlaminar epidural steroid injection with fluoroscopic guidance.  The patient has failed conservative care including home exercise, medications, time and activity modification.  This injection will be diagnostic and hopefully therapeutic.  Please see requesting physician notes for further details and justification.   ROS Otherwise per HPI.  Assessment & Plan: Visit Diagnoses:    ICD-10-CM   1. Lumbar radiculopathy  M54.16 XR C-ARM NO REPORT    Epidural Steroid injection    methylPREDNISolone  acetate (DEPO-MEDROL ) injection 40 mg      Plan: No additional findings.   Meds & Orders:  Meds ordered this encounter  Medications   methylPREDNISolone  acetate (DEPO-MEDROL ) injection 40 mg    Orders Placed This Encounter  Procedures   XR C-ARM NO REPORT   Epidural Steroid injection    Follow-up: Return for visit to requesting provider as needed.   Procedures: No procedures performed  Lumbar Epidural Steroid Injection - Interlaminar Approach with Fluoroscopic Guidance  Patient: Garrett Clark      Date of Birth: 1961/03/09 MRN: [id number] PCP: Marius Siemens, NP      Visit Date: 05/25/2024   Universal Protocol:     Consent Given By: the patient  Position: PRONE  Additional Comments: Vital signs were monitored before and after the procedure. Patient was prepped and draped in the usual sterile fashion. The correct patient, procedure, and site was verified.   Injection Procedure Details:   Procedure diagnoses: Lumbar radiculopathy [M54.16]   Meds Administered:   Meds ordered this encounter  Medications   methylPREDNISolone  acetate (DEPO-MEDROL ) injection 40 mg     Laterality: Right  Location/Site:  L5-S1  Needle: 3.5 in., 20 ga. Tuohy  Needle Placement: Paramedian epidural  Findings:   -Comments: Excellent flow of contrast into the epidural space.  Procedure Details: Using a paramedian approach from the side mentioned above, the region overlying the inferior lamina was localized under fluoroscopic visualization and the soft tissues overlying this structure were infiltrated with 4 ml. of 1% Lidocaine  without Epinephrine. The Tuohy needle was inserted into the epidural space using a paramedian approach.   The epidural space was localized using loss of resistance along with counter oblique bi-planar fluoroscopic views.  After negative aspirate for air, blood, and CSF, a 2 ml. volume of Isovue -250 was injected into the epidural space and the flow of contrast was observed. Radiographs were obtained for documentation purposes.    The injectate was administered into the level noted above.   Additional Comments:  The patient tolerated the procedure well Dressing: 2 x 2 sterile gauze and Band-Aid    Post-procedure details: Patient was observed during the procedure. Post-procedure instructions were reviewed.  Patient left the clinic in stable condition.   Clinical History: Narrative & Impression CLINICAL DATA:  Low back pain radiating into the right hip and leg for 1 year. No known injury. Steroid injection 1 month ago.   EXAM: MRI LUMBAR SPINE WITHOUT CONTRAST   TECHNIQUE: Multiplanar, multisequence MR imaging of the lumbar spine was performed. No intravenous contrast was administered.  COMPARISON:  Lumbar spine radiographs 01/23/2023. Lumbar MRI 01/16/2021.   FINDINGS: Segmentation: Conventional anatomy assumed, with the last open disc space designated L5-S1.Concordant with prior imaging.   Alignment:  Physiologic.    Vertebrae: No worrisome osseous lesion, acute fracture or pars defect. Mildly progressive reactive edema adjacent to the L4-5 facet joints. The visualized sacroiliac joints appear unremarkable.   Conus medullaris: Extends to the L1 level and appears normal.   Paraspinal and other soft tissues: No significant paraspinal findings. Similar scattered T2 hyperintensities within the liver and right kidney, likely cysts based on stability; no follow-up imaging recommended. The left kidney appears surgically absent.   Disc levels:   No significant disc space findings from T11-12 through L2-3.   L3-4: Stable mild loss of disc height with mild disc bulging, facet and ligamentous hypertrophy. No resulting spinal stenosis or nerve root encroachment.   L4-5: Preserved disc height with mild disc bulging, facet and ligamentous hypertrophy. No focal disc protrusion, nerve root encroachment or significant spinal stenosis demonstrated currently.   L5-S1: Mild disc bulging eccentric to the left. Mild bilateral facet hypertrophy. Mild inferior left foraminal narrowing without nerve root encroachment or significant spinal stenosis.   IMPRESSION: 1. No acute findings or explanation for the patient's symptoms. 2. Mild disc bulging, facet and ligamentous hypertrophy at L3-4, L4-5 and L5-S1, similar to previous MRI. No resulting significant spinal stenosis or nerve root encroachment. 3. Mildly progressive reactive edema adjacent to the L4-5 facet joints.     Electronically Signed   By: Elmon Hagedorn M.D.   On: 03/03/2023 08:45     Objective:  VS:  HT:    WT:   BMI:     BP:119/79  HR:90bpm  TEMP: ( )  RESP:  Physical Exam Vitals and nursing note reviewed.  Constitutional:      General: He is not in acute distress.    Appearance: Normal appearance. He is not ill-appearing.  HENT:     Head: Normocephalic and atraumatic.     Right Ear: External ear normal.     Left Ear: External ear  normal.     Nose: No congestion.  Eyes:     Extraocular Movements: Extraocular movements intact.  Cardiovascular:     Rate and Rhythm: Normal rate.     Pulses: Normal pulses.  Pulmonary:     Effort: Pulmonary effort is normal. No respiratory distress.  Abdominal:     General: There is no distension.     Palpations: Abdomen is soft.  Musculoskeletal:        General: No tenderness or signs of injury.     Cervical back: Neck supple.     Right lower leg: No edema.     Left lower leg: No edema.     Comments: Patient has good distal strength without clonus.  Skin:    Findings: No erythema or rash.  Neurological:     General: No focal deficit present.     Mental Status: He is alert and oriented to person, place, and time.     Sensory: No sensory deficit.     Motor: No weakness or abnormal muscle tone.     Coordination: Coordination normal.  Psychiatric:        Mood and Affect: Mood normal.        Behavior: Behavior normal.      Imaging: No results found.

## 2024-06-04 ENCOUNTER — Emergency Department (HOSPITAL_COMMUNITY)
Admission: EM | Admit: 2024-06-04 | Discharge: 2024-06-05 | Disposition: A | Discharge status: Home / Self Care | Attending: Emergency Medicine | Admitting: Emergency Medicine

## 2024-06-04 ENCOUNTER — Encounter (HOSPITAL_COMMUNITY): Payer: Self-pay | Admitting: *Deleted

## 2024-06-04 ENCOUNTER — Other Ambulatory Visit: Payer: Self-pay

## 2024-06-04 DIAGNOSIS — M19011 Primary osteoarthritis, right shoulder: Secondary | ICD-10-CM | POA: Diagnosis not present

## 2024-06-04 DIAGNOSIS — Z85528 Personal history of other malignant neoplasm of kidney: Secondary | ICD-10-CM | POA: Insufficient documentation

## 2024-06-04 DIAGNOSIS — M25511 Pain in right shoulder: Secondary | ICD-10-CM | POA: Insufficient documentation

## 2024-06-04 NOTE — ED Triage Notes (Signed)
 The pt is c/o both shoulders and neck pain  for one wek  he has actually had this pain for 10 years intermittently

## 2024-06-05 ENCOUNTER — Emergency Department (HOSPITAL_COMMUNITY)

## 2024-06-05 DIAGNOSIS — M25511 Pain in right shoulder: Secondary | ICD-10-CM | POA: Diagnosis not present

## 2024-06-05 DIAGNOSIS — M19011 Primary osteoarthritis, right shoulder: Secondary | ICD-10-CM | POA: Diagnosis not present

## 2024-06-05 MED ORDER — METHYLPREDNISOLONE 4 MG PO TBPK: ORAL_TABLET | ORAL | 0 refills | Status: DC

## 2024-06-05 MED ORDER — OXYCODONE-ACETAMINOPHEN 5-325 MG PO TABS
1.0000 | ORAL_TABLET | Freq: Once | ORAL | Status: AC
  Administered 2024-06-05: 1 via ORAL
  Filled 2024-06-05: qty 1

## 2024-06-05 MED ORDER — CYCLOBENZAPRINE HCL 5 MG PO TABS
5.0000 mg | ORAL_TABLET | Freq: Two times a day (BID) | ORAL | 0 refills | Status: AC | PRN
Start: 2024-06-05 — End: ?
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
## 2024-06-05 NOTE — ED Notes (Signed)
Pt requesting pain medication. Triage RN notified.

## 2024-06-05 NOTE — Discharge Instructions (Signed)
 You were seen today for shoulder pain.  There is some evidence of arthritis in the shoulder.  He will be given a Medrol  Dosepak for anti-inflammatory effect.  You may also take muscle relaxer.  Do not drive while taking muscle relaxer.  Follow-up with orthopedics.

## 2024-06-16 DIAGNOSIS — N1832 Chronic kidney disease, stage 3b: Secondary | ICD-10-CM | POA: Diagnosis not present

## 2024-06-16 DIAGNOSIS — R809 Proteinuria, unspecified: Secondary | ICD-10-CM | POA: Diagnosis not present

## 2024-06-16 DIAGNOSIS — E785 Hyperlipidemia, unspecified: Secondary | ICD-10-CM | POA: Diagnosis not present

## 2024-06-16 DIAGNOSIS — I129 Hypertensive chronic kidney disease with stage 1 through stage 4 chronic kidney disease, or unspecified chronic kidney disease: Secondary | ICD-10-CM | POA: Diagnosis not present

## 2024-06-17 ENCOUNTER — Other Ambulatory Visit: Payer: Self-pay

## 2024-06-17 ENCOUNTER — Ambulatory Visit (INDEPENDENT_AMBULATORY_CARE_PROVIDER_SITE_OTHER): Admitting: Surgical

## 2024-06-17 DIAGNOSIS — M25511 Pain in right shoulder: Secondary | ICD-10-CM | POA: Diagnosis not present

## 2024-06-17 DIAGNOSIS — M19011 Primary osteoarthritis, right shoulder: Secondary | ICD-10-CM

## 2024-06-17 DIAGNOSIS — M25512 Pain in left shoulder: Secondary | ICD-10-CM | POA: Diagnosis not present

## 2024-06-17 DIAGNOSIS — G8929 Other chronic pain: Secondary | ICD-10-CM | POA: Diagnosis not present

## 2024-06-17 DIAGNOSIS — M1712 Unilateral primary osteoarthritis, left knee: Secondary | ICD-10-CM | POA: Diagnosis not present

## 2024-06-19 ENCOUNTER — Encounter: Payer: Self-pay | Admitting: Surgical

## 2024-06-19 MED ORDER — TRIAMCINOLONE ACETONIDE 40 MG/ML IJ SUSP
40.0000 mg | INTRAMUSCULAR | Status: AC | PRN
  Administered 2024-06-17: 40 mg via INTRA_ARTICULAR

## 2024-06-19 MED ORDER — BUPIVACAINE HCL 0.25 % IJ SOLN
0.6600 mL | INTRAMUSCULAR | Status: AC | PRN
  Administered 2024-06-17: .66 mL via INTRA_ARTICULAR

## 2024-06-19 MED ORDER — LIDOCAINE HCL 1 % IJ SOLN
3.0000 mL | INTRAMUSCULAR | Status: AC | PRN
  Administered 2024-06-17: 3 mL

## 2024-06-19 MED ORDER — TRIAMCINOLONE ACETONIDE 40 MG/ML IJ SUSP
20.0000 mg | INTRAMUSCULAR | Status: AC | PRN
  Administered 2024-06-17: 20 mg via INTRA_ARTICULAR

## 2024-06-19 MED ORDER — BUPIVACAINE HCL 0.25 % IJ SOLN
4.0000 mL | INTRAMUSCULAR | Status: AC | PRN
  Administered 2024-06-17: 4 mL via INTRA_ARTICULAR

## 2024-06-19 MED ORDER — LIDOCAINE HCL 1 % IJ SOLN
5.0000 mL | INTRAMUSCULAR | Status: AC | PRN
  Administered 2024-06-17: 5 mL
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
## 2024-06-20 NOTE — Progress Notes (Signed)
I called, voicemail not set up. Unable to leave message.

## 2024-06-27 DIAGNOSIS — I129 Hypertensive chronic kidney disease with stage 1 through stage 4 chronic kidney disease, or unspecified chronic kidney disease: Secondary | ICD-10-CM | POA: Diagnosis not present

## 2024-06-27 DIAGNOSIS — E785 Hyperlipidemia, unspecified: Secondary | ICD-10-CM | POA: Diagnosis not present

## 2024-06-27 DIAGNOSIS — R809 Proteinuria, unspecified: Secondary | ICD-10-CM | POA: Diagnosis not present

## 2024-06-27 DIAGNOSIS — N1832 Chronic kidney disease, stage 3b: Secondary | ICD-10-CM | POA: Diagnosis not present

## 2024-06-30 NOTE — Progress Notes (Signed)
 Per chart, patient is scheduled for MRI arthrogram and authorization has been received by insurance.

## 2024-07-05 ENCOUNTER — Encounter (HOSPITAL_COMMUNITY): Payer: Self-pay

## 2024-07-05 ENCOUNTER — Ambulatory Visit (HOSPITAL_COMMUNITY): Admission: EM | Admit: 2024-07-05 | Discharge: 2024-07-05 | Disposition: A | Discharge status: Home / Self Care

## 2024-07-05 DIAGNOSIS — N451 Epididymitis: Secondary | ICD-10-CM | POA: Diagnosis not present

## 2024-07-05 DIAGNOSIS — Z85528 Personal history of other malignant neoplasm of kidney: Secondary | ICD-10-CM | POA: Insufficient documentation

## 2024-07-05 DIAGNOSIS — Z7721 Contact with and (suspected) exposure to potentially hazardous body fluids: Secondary | ICD-10-CM | POA: Insufficient documentation

## 2024-07-05 DIAGNOSIS — R1032 Left lower quadrant pain: Secondary | ICD-10-CM | POA: Insufficient documentation

## 2024-07-05 DIAGNOSIS — C642 Malignant neoplasm of left kidney, except renal pelvis: Secondary | ICD-10-CM | POA: Diagnosis not present

## 2024-07-05 DIAGNOSIS — R590 Localized enlarged lymph nodes: Secondary | ICD-10-CM | POA: Diagnosis not present

## 2024-07-05 DIAGNOSIS — N1832 Chronic kidney disease, stage 3b: Secondary | ICD-10-CM | POA: Diagnosis not present

## 2024-07-05 DIAGNOSIS — Z905 Acquired absence of kidney: Secondary | ICD-10-CM | POA: Diagnosis not present

## 2024-07-05 MED ORDER — SULFAMETHOXAZOLE-TRIMETHOPRIM 800-160 MG PO TABS: 1.0000 | ORAL_TABLET | Freq: Two times a day (BID) | ORAL | 0 refills | Status: AC

## 2024-07-05 NOTE — Discharge Instructions (Signed)
 Epididymitis with inguinal lymphadenopathy and exposure to body fluids: Patient decided to collect swab for STI testing.  Will adjust his plan of care, if needed once the swab results.  Bactrim  DS, 1 pill twice daily for 10 days for epididymitis.  The epididymitis and the inguinal lymphadenopathy should resolve with completion of the antibiotic.  Patient has primary care, nephrology and urology.  If symptoms persist needs to follow-up with primary care and urology.  Encouraged elevation and ice packs to the testicles, for comfort.  May use acetaminophen  or ibuprofen as directed on the package, if needed for pain.

## 2024-07-05 NOTE — ED Triage Notes (Signed)
 Patient here today with c/o left groin pain and swelling X 2 years but has been worsening for the 4 days. He tried taking Tylenol  with no relief. Patient states that he had his left kidney removed 5+ years ago.

## 2024-07-05 NOTE — ED Provider Notes (Signed)
 [facility]    CSN: [id number] Arrival date & time: 07/05/24  1124      History   Chief Complaint Chief Complaint  Patient presents with   Groin Pain    HPI Garrett Clark is a 63 y.o. male.   63 year old male here with his significant other.  Patient reports left groin pain and some swelling for approximately 2 years (early 2023).  He has had acute worsening of the pain since 07/01/2024.  He is in a stable monogamous relationship and reports no concerns about STIs.  After some thought his partner asked if we could go ahead and test him just to be safe.  He had a renal cell carcinoma on the left and had a robotic assisted left nephrectomy in 2018.  He sees nephrology for chronic kidney disease stage IIIb.  He sees urology.  And he regularly sees primary care.   Groin Pain Pertinent negatives include no chest pain and no abdominal pain.    Past Medical History:  Diagnosis Date   Cancer [facility])    Left kidney   Hyperlipidemia    Internal hemorrhoids    Polycystic liver disease    Substance abuse [facility])     Patient Active Problem List   Diagnosis Date Noted   Stage 3 chronic kidney disease (HCC) 08/10/2023   GERD (gastroesophageal reflux disease) 01/13/2019   History of left radical nephrectomy 01/13/2019   Renal cell carcinoma of left kidney (HCC) 12/07/2017   Prolapsed internal hemorrhoids, grade 2 06/19/2017   Pure hypercholesterolemia 03/30/2017   History of cocaine abuse (HCC) 03/23/2017    Past Surgical History:  Procedure Laterality Date   COLONOSCOPY     HEMORRHOID BANDING     ROBOT ASSISTED LAPAROSCOPIC NEPHRECTOMY Left 12/07/2017   Procedure: XI ROBOTIC ASSISTED LAPAROSCOPIC NEPHRECTOMY;  Surgeon: Sherrilee Belvie CROME, MD;  Location: [facility];  Service: Urology;  Laterality: Left;       Home Medications    Prior to Admission medications   Medication Sig Start Date End Date Taking? Authorizing Provider  spironolactone (ALDACTONE) 25 MG tablet  Take 25 mg by mouth once. 05/23/24 05/23/25 Yes [provider]  sulfamethoxazole -trimethoprim  (BACTRIM  DS) 800-160 MG tablet Take 1 tablet by mouth 2 (two) times daily for 10 days. 07/05/24 07/15/24 Yes Ival Domino, FNP  valsartan  (DIOVAN ) 80 MG tablet Take 80 mg by mouth daily. 06/27/24 06/27/25 Yes [provider]  amLODipine (NORVASC) 10 MG tablet Take 10 mg by mouth daily.    [provider]  atorvastatin  (LIPITOR) 20 MG tablet TAKE 1 TABLET(20 MG) BY MOUTH DAILY 03/08/24   Celestia Rosaline SQUIBB, NP  cyclobenzaprine  (FLEXERIL ) 5 MG tablet Take 1 tablet (5 mg total) by mouth 2 (two) times daily as needed for muscle spasms. 06/05/24   Horton, Charmaine FALCON, MD  FARXIGA 10 MG TABS tablet Take 10 mg by mouth every morning.    [provider]  gabapentin  (NEURONTIN ) 300 MG capsule TAKE 1 CAPSULE BY MOUTH TWICE DAILY 03/23/24   Celestia Rosaline SQUIBB, NP  omeprazole  (PRILOSEC) 20 MG capsule Take 1 capsule (20 mg total) by mouth daily. 03/15/24 04/14/24  Mannie Fairy DASEN, DO    Family History Family History  Problem Relation Age of Onset   Diabetes Mother    Hypertension Mother    Alcohol abuse Mother    Diabetes Father    Kidney disease Father    Diabetes Sister    Stroke Sister    Diabetes Paternal Grandmother  Cancer Paternal Grandfather        type unknown   Colon cancer Neg Hx     Social History Social History   Tobacco Use   Smoking status: Former    Current packs/day: 0.00    Types: Cigarettes    Quit date: 12/03/2012    Years since quitting: 11.5   Smokeless tobacco: Never  Vaping Use   Vaping status: Never Used  Substance Use Topics   Alcohol use: Not Currently    Comment: Occasional   Drug use: Yes    Types: Marijuana    Comment: was addicted to cocaine for 23 yrs but clean now for about  12-13 yrs; last  marijuana  use was this mo rning      Allergies   Patient has no known allergies.   Review of Systems Review of Systems   Constitutional:  Negative for fever.  Respiratory:  Negative for cough.   Cardiovascular:  Negative for chest pain.  Gastrointestinal:  Negative for abdominal pain, constipation, diarrhea, nausea and vomiting.  Genitourinary:  Positive for scrotal swelling (On the left) and testicular pain (On the left). Negative for decreased urine volume, difficulty urinating, dysuria, frequency, genital sores, hematuria, penile discharge, penile swelling and urgency.  Musculoskeletal:  Negative for arthralgias and back pain.  Skin:  Negative for color change and rash.  Neurological:  Negative for syncope.  Hematological:  Positive for adenopathy (Bilaterally at the groin).  All other systems reviewed and are negative.    Physical Exam Triage Vital Signs ED Triage Vitals [07/05/24 1140]  Encounter Vitals Group     BP 108/64     Girls Systolic BP Percentile      Girls Diastolic BP Percentile      Boys Systolic BP Percentile      Boys Diastolic BP Percentile      Pulse Rate (!) 105     Resp 16     Temp 98.4 F (36.9 C)     Temp Source Oral     SpO2 95 %     Weight      Height      Head Circumference      Peak Flow      Pain Score 10     Pain Loc      Pain Education      Exclude from Growth Chart    No data found.  Updated Vital Signs BP 108/64 (BP Location: Left Arm)   Pulse (!) 105   Temp 98.4 F (36.9 C) (Oral)   Resp 16   SpO2 95%   Visual Acuity Right Eye Distance:   Left Eye Distance:   Bilateral Distance:    Right Eye Near:   Left Eye Near:    Bilateral Near:     Physical Exam Vitals and nursing note reviewed. Exam conducted with a chaperone present Tax adviser, CMA).  Constitutional:      General: He is not in acute distress.    Appearance: He is well-developed. He is not ill-appearing or toxic-appearing.  HENT:     Head: Normocephalic and atraumatic.     Right Ear: Hearing, tympanic membrane, ear canal and external ear normal.     Left Ear: Hearing, tympanic  membrane, ear canal and external ear normal.     Nose: No congestion or rhinorrhea.     Right Sinus: No maxillary sinus tenderness or frontal sinus tenderness.     Left Sinus: No maxillary sinus tenderness or frontal sinus  tenderness.     Mouth/Throat:     Lips: Pink.     Mouth: Mucous membranes are moist.     Pharynx: Uvula midline. No oropharyngeal exudate or posterior oropharyngeal erythema.     Tonsils: No tonsillar exudate.  Eyes:     Conjunctiva/sclera: Conjunctivae normal.     Pupils: Pupils are equal, round, and reactive to light.  Cardiovascular:     Rate and Rhythm: Normal rate and regular rhythm.     Heart sounds: S1 normal and S2 normal. No murmur heard. Pulmonary:     Effort: Pulmonary effort is normal. No respiratory distress.     Breath sounds: Normal breath sounds. No decreased breath sounds, wheezing, rhonchi or rales.  Abdominal:     General: Bowel sounds are normal.     Palpations: Abdomen is soft.     Tenderness: There is no abdominal tenderness.     Hernia: There is no hernia in the left inguinal area or right inguinal area.  Genitourinary:    Penis: Normal and circumcised.      Testes:        Right: Mass, tenderness, swelling, testicular hydrocele or varicocele not present. Right testis is descended. Cremasteric reflex is present.         Left: Tenderness, swelling and varicocele present. Mass or testicular hydrocele not present. Left testis is descended. Cremasteric reflex is present.      Epididymis:     Right: Normal.     Left: Inflamed and enlarged (Mild). Tenderness present. No mass.  Musculoskeletal:        General: No swelling.     Cervical back: Neck supple.  Lymphadenopathy:     Head:     Right side of head: No submental, submandibular, tonsillar, preauricular or posterior auricular adenopathy.     Left side of head: No submental, submandibular, tonsillar, preauricular or posterior auricular adenopathy.     Cervical: No cervical adenopathy.      Right cervical: No superficial cervical adenopathy.    Left cervical: No superficial cervical adenopathy.     Lower Body: Right inguinal adenopathy present. Left inguinal adenopathy present.  Skin:    General: Skin is warm and dry.     Capillary Refill: Capillary refill takes less than 2 seconds.     Findings: No rash.  Neurological:     Mental Status: He is alert and oriented to person, place, and time.  Psychiatric:        Mood and Affect: Mood normal.      UC Treatments / Results  Labs (all labs ordered are listed, but only abnormal results are displayed) Labs Reviewed  CYTOLOGY, (ORAL, ANAL, URETHRAL) ANCILLARY ONLY    EKG   Radiology No results found.  Procedures Procedures (including critical care time)  Medications Ordered in UC Medications - No data to display  Initial Impression / Assessment and Plan / UC Course  I have reviewed the triage vital signs and the nursing notes.  Pertinent labs & imaging results that were available during my care of the patient were reviewed by me and considered in my medical decision making (see chart for details).  Plan of Care: Left epididymitis, bilateral inguinal lymphadenopathy and exposure to body fluids: STI swab collected by the patient.  Will adjust plan of care, if needed once the swab results.  Bactrim  DS, 1 pill twice daily for 10 days.  Symptoms should improve and lymphadenopathy should resolve with use of antibiotics.  If symptoms do not improve,  resolve, may need scrotal ultrasound for further evaluation or additional testing.  Follow-up with primary care or urology if symptoms do not completely resolve, if symptoms worsen or if new symptoms occur.  Follow-up here as needed.  I reviewed the plan of care with the patient and/or the patient's guardian.  The patient and/or guardian had time to ask questions and acknowledged that the questions were answered.  I provided instruction on symptoms or reasons to return here or  to go to an ER, if symptoms/condition did not improve, worsened or if new symptoms occurred.  Final Clinical Impressions(s) / UC Diagnoses   Final diagnoses:  Epididymitis, left  Superficial inguinal lymphadenopathy  Exposure to potentially hazardous body fluids     Discharge Instructions      Epididymitis with inguinal lymphadenopathy and exposure to body fluids: Patient decided to collect swab for STI testing.  Will adjust his plan of care, if needed once the swab results.  Bactrim  DS, 1 pill twice daily for 10 days for epididymitis.  The epididymitis and the inguinal lymphadenopathy should resolve with completion of the antibiotic.  Patient has primary care, nephrology and urology.  If symptoms persist needs to follow-up with primary care and urology.  Encouraged elevation and ice packs to the testicles, for comfort.  May use acetaminophen  or ibuprofen as directed on the package, if needed for pain.     ED Prescriptions     Medication Sig Dispense Auth. Provider   sulfamethoxazole -trimethoprim  (BACTRIM  DS) 800-160 MG tablet Take 1 tablet by mouth 2 (two) times daily for 10 days. 20 tablet Larinda Herter, FNP      PDMP not reviewed this encounter.   Ival Domino, FNP 07/05/24 1246

## 2024-07-06 LAB — CYTOLOGY, (ORAL, ANAL, URETHRAL) ANCILLARY ONLY
Chlamydia: NEGATIVE
Comment: NEGATIVE
Comment: NEGATIVE
Comment: NORMAL
Neisseria Gonorrhea: NEGATIVE
Trichomonas: NEGATIVE

## 2024-07-07 ENCOUNTER — Ambulatory Visit (HOSPITAL_BASED_OUTPATIENT_CLINIC_OR_DEPARTMENT_OTHER): Payer: Self-pay | Admitting: Family Medicine

## 2024-07-07 NOTE — Progress Notes (Signed)
 Negative.  Antibiotics nor other medication not needed.  Patient advised to check the portal for results during his visit.

## 2024-07-12 ENCOUNTER — Ambulatory Visit
Admission: RE | Admit: 2024-07-12 | Discharge: 2024-07-12 | Disposition: A | Discharge status: Home / Self Care | Source: Ambulatory Visit | Attending: Surgical | Admitting: Surgical

## 2024-07-12 DIAGNOSIS — G8929 Other chronic pain: Secondary | ICD-10-CM | POA: Diagnosis not present

## 2024-07-12 DIAGNOSIS — M25512 Pain in left shoulder: Secondary | ICD-10-CM | POA: Diagnosis not present

## 2024-07-12 DIAGNOSIS — M75112 Incomplete rotator cuff tear or rupture of left shoulder, not specified as traumatic: Secondary | ICD-10-CM | POA: Diagnosis not present

## 2024-07-12 MED ORDER — IOPAMIDOL (ISOVUE-M 200) INJECTION 41%
10.0000 mL | Freq: Once | INTRAMUSCULAR | Status: AC
  Administered 2024-07-12: 10 mL via INTRA_ARTICULAR

## 2024-07-19 ENCOUNTER — Telehealth (INDEPENDENT_AMBULATORY_CARE_PROVIDER_SITE_OTHER): Payer: Self-pay | Admitting: Primary Care

## 2024-07-19 NOTE — Telephone Encounter (Signed)
 Called pt to confirm appt. Pt will be present.

## 2024-07-20 ENCOUNTER — Encounter (INDEPENDENT_AMBULATORY_CARE_PROVIDER_SITE_OTHER): Payer: Self-pay | Admitting: Primary Care

## 2024-07-20 ENCOUNTER — Ambulatory Visit (INDEPENDENT_AMBULATORY_CARE_PROVIDER_SITE_OTHER): Admitting: Primary Care

## 2024-07-20 VITALS — BP 91/52 | HR 99 | Resp 16 | Wt 179.6 lb

## 2024-07-20 DIAGNOSIS — R351 Nocturia: Secondary | ICD-10-CM

## 2024-07-20 DIAGNOSIS — E782 Mixed hyperlipidemia: Secondary | ICD-10-CM

## 2024-07-20 DIAGNOSIS — I10 Essential (primary) hypertension: Secondary | ICD-10-CM | POA: Diagnosis not present

## 2024-07-20 DIAGNOSIS — R5383 Other fatigue: Secondary | ICD-10-CM | POA: Diagnosis not present

## 2024-07-20 DIAGNOSIS — R42 Dizziness and giddiness: Secondary | ICD-10-CM | POA: Diagnosis not present

## 2024-07-20 DIAGNOSIS — E861 Hypovolemia: Secondary | ICD-10-CM | POA: Diagnosis not present

## 2024-07-20 NOTE — Progress Notes (Signed)
 [facility]  Adonis Yim, is a 63 y.o. male  RDW:[id number]  FMW:[id number]  DOB - January 23, 1961  Chief Complaint  Patient presents with   Fatigue   Dizziness    Started couple weeks ago       Subjective:   Germaine Shenker is a 63 y.o. male here today for an acute visit. He is experiencing fatigue and dizziness. Probable stress related - funeral 09/06/2024 1/2 brother died and 09/25/2024 is his brother birthday that is deceased - died of prostate Ca. 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 Only way he knew how to survive  and take of his family was the streets at the age of 50.  HPI  No problems updated.  Comprehensive ROS Pertinent positive and negative noted in HPI   No Known Allergies  Past Medical History:  Diagnosis Date   Cancer (HCC)    Left kidney   Hyperlipidemia    Internal hemorrhoids    Polycystic liver disease    Substance abuse (HCC)     Current Outpatient Medications on File Prior to Visit  Medication Sig Dispense Refill   amLODipine (NORVASC) 10 MG tablet Take 10 mg by mouth daily.     cyclobenzaprine  (FLEXERIL ) 5 MG tablet Take 1 tablet (5 mg total) by mouth 2 (two) times daily as needed for muscle spasms. 10 tablet 0   FARXIGA 10 MG TABS tablet Take 10 mg by mouth every morning.     gabapentin  (NEURONTIN ) 300 MG capsule TAKE 1 CAPSULE BY MOUTH TWICE DAILY 180 capsule 0   omeprazole  (PRILOSEC) 20 MG capsule Take 1 capsule (20 mg total) by mouth daily. 30 capsule 0   spironolactone (ALDACTONE) 25 MG tablet Take 25 mg by mouth once.     valsartan  (DIOVAN ) 80 MG tablet Take 80 mg by mouth daily.     No current facility-administered medications on file prior to visit.   Health Maintenance  Topic Date Due   Pneumococcal Vaccine for age over 29 (1 of 2 - PCV) Never done   COVID-19 Vaccine (3 - Moderna risk series) 06/26/2020   Zoster (Shingles) Vaccine (2 of 2) Sep 06, 2022   Flu Shot  07/22/2024   Pneumococcal Vaccine for high  risk medical condition (1 of 2 - PCV) 01/20/2025*   DTaP/Tdap/Td vaccine (2 - Td or Tdap) 03/24/2027   Colon Cancer Screening  05/09/2027   Hepatitis C Screening  Completed   HIV Screening  Completed   Hepatitis B Vaccine  Aged Out   HPV Vaccine  Aged Out   Meningitis B Vaccine  Aged Out  *Topic was postponed. The date shown is not the original due date.    Objective:   Vitals:   07/20/24 1032 07/20/24 1034  BP: (!) 83/53 (!) 91/52  Pulse: 99   Resp: 16   SpO2: 96%   Weight: 179 lb 9.6 oz (81.5 kg)    BP Readings from Last 3 Encounters:  07/20/24 (!) 91/52  07/05/24 108/64  06/05/24 124/74      Physical Exam Vitals reviewed.  Constitutional:      Appearance: Normal appearance.  HENT:     Head: Normocephalic.     Right Ear: Tympanic membrane and external ear normal.     Left Ear: Tympanic membrane and external ear normal.     Nose: Nose normal.  Eyes:     Extraocular Movements: Extraocular movements intact.     Pupils: Pupils are equal, round, and reactive to  light.  Cardiovascular:     Rate and Rhythm: Normal rate and regular rhythm.  Pulmonary:     Effort: Pulmonary effort is normal.     Breath sounds: Normal breath sounds.  Abdominal:     General: Bowel sounds are normal. There is distension.     Palpations: Abdomen is soft.  Musculoskeletal:        General: Normal range of motion.  Skin:    General: Skin is warm and dry.  Neurological:     Mental Status: He is alert and oriented to person, place, and time.  Psychiatric:        Mood and Affect: Mood normal.        Behavior: Behavior normal.        Thought Content: Thought content normal.        Judgment: Judgment normal.      Assessment & Plan  Bernerd Terhune was seen today for fatigue and dizziness.  Diagnoses and all orders for this visit:  Mixed hyperlipidemia -     Lipid panel  Hypotension 2/2 Dizziness and giddiness  Bp meds prescribed outside practice  -     CBC with  Differential/Platelet -     CMP14+EGFR  Other fatigue -     VITAMIN D  25 Hydroxy (Vit-D Deficiency, Fractures)  Nocturia -     PSA  Other orders -     Specimen status report -     CBC with Differential/Platelet -     Specimen status report -     PSA -     Specimen status report    Patient have been counseled extensively about nutrition and exercise. Other issues discussed during this visit include: low cholesterol diet, weight control and daily exercise, foot care, annual eye examinations at Ophthalmology, importance of adherence with medications and regular follow-up. We also discussed long term complications of uncontrolled diabetes and hypertension.   Return in about 6 months (around 01/20/2025).  The patient was given clear instructions to go to ER or return to medical center if symptoms don't improve, worsen or new problems develop. The patient verbalized understanding. The patient was told to call to get lab results if they haven't heard anything in the next week.   This note has been created with Education officer, environmental. Any transcriptional errors are unintentional.   Rosaline SHAUNNA Bohr, NP 07/25/2024, 3:20 AM

## 2024-07-21 ENCOUNTER — Ambulatory Visit: Payer: Self-pay | Admitting: Primary Care

## 2024-07-21 ENCOUNTER — Ambulatory Visit: Admitting: Surgical

## 2024-07-21 LAB — CBC WITH DIFFERENTIAL/PLATELET
Basophils Absolute: 0.1 x10E3/uL (ref 0.0–0.2)
Basos: 1 %
EOS (ABSOLUTE): 0.3 x10E3/uL (ref 0.0–0.4)
Eos: 4 %
Hematocrit: 37.8 % (ref 37.5–51.0)
Hemoglobin: 12.1 g/dL — ABNORMAL LOW (ref 13.0–17.7)
Immature Grans (Abs): 0.1 x10E3/uL (ref 0.0–0.1)
Immature Granulocytes: 2 %
Lymphocytes Absolute: 1.3 x10E3/uL (ref 0.7–3.1)
Lymphs: 22 %
MCH: 29.9 pg (ref 26.6–33.0)
MCHC: 32 g/dL (ref 31.5–35.7)
MCV: 93 fL (ref 79–97)
Monocytes Absolute: 0.5 x10E3/uL (ref 0.1–0.9)
Monocytes: 9 %
Neutrophils Absolute: 3.6 x10E3/uL (ref 1.4–7.0)
Neutrophils: 62 %
Platelets: 536 x10E3/uL — ABNORMAL HIGH (ref 150–450)
RBC: 4.05 x10E6/uL — ABNORMAL LOW (ref 4.14–5.80)
RDW: 14.2 % (ref 11.6–15.4)
WBC: 5.8 x10E3/uL (ref 3.4–10.8)

## 2024-07-21 LAB — SPECIMEN STATUS REPORT

## 2024-07-21 LAB — PSA

## 2024-07-21 MED ORDER — ATORVASTATIN CALCIUM 80 MG PO TABS: 40.0000 mg | ORAL_TABLET | Freq: Every day | ORAL | 1 refills | Status: DC

## 2024-07-21 MED ORDER — ERGOCALCIFEROL 1.25 MG (50000 UT) PO CAPS: 50000.0000 [IU] | ORAL_CAPSULE | ORAL | 0 refills | Status: DC

## 2024-07-22 LAB — PSA: Prostate Specific Ag, Serum: 0.9 ng/mL (ref 0.0–4.0)

## 2024-07-22 LAB — SPECIMEN STATUS REPORT

## 2024-07-23 LAB — CBC WITH DIFFERENTIAL/PLATELET

## 2024-07-23 LAB — LIPID PANEL
Chol/HDL Ratio: 7.7 ratio — ABNORMAL HIGH (ref 0.0–5.0)
Cholesterol, Total: 200 mg/dL — ABNORMAL HIGH (ref 100–199)
HDL: 26 mg/dL — ABNORMAL LOW (ref >39)
LDL Chol Calc (NIH): 144 mg/dL — ABNORMAL HIGH (ref 0–99)
Triglycerides: 166 mg/dL — ABNORMAL HIGH (ref 0–149)
VLDL Cholesterol Cal: 30 mg/dL (ref 5–40)

## 2024-07-23 LAB — CMP14+EGFR
ALT: 41 IU/L (ref 0–44)
AST: 27 IU/L (ref 0–40)
Albumin: 3.3 g/dL — ABNORMAL LOW (ref 3.9–4.9)
Alkaline Phosphatase: 295 IU/L — ABNORMAL HIGH (ref 44–121)
BUN/Creatinine Ratio: 11 (ref 10–24)
BUN: 28 mg/dL — ABNORMAL HIGH (ref 8–27)
Bilirubin Total: 0.2 mg/dL (ref 0.0–1.2)
CO2: 17 mmol/L — ABNORMAL LOW (ref 20–29)
Calcium: 8.5 mg/dL — ABNORMAL LOW (ref 8.6–10.2)
Chloride: 104 mmol/L (ref 96–106)
Creatinine, Ser: 2.66 mg/dL — ABNORMAL HIGH (ref 0.76–1.27)
Globulin, Total: 4.7 g/dL — ABNORMAL HIGH (ref 1.5–4.5)
Glucose: 113 mg/dL — ABNORMAL HIGH (ref 70–99)
Potassium: 5.6 mmol/L — ABNORMAL HIGH (ref 3.5–5.2)
Sodium: 133 mmol/L — ABNORMAL LOW (ref 134–144)
Total Protein: 8 g/dL (ref 6.0–8.5)
eGFR: 26 mL/min/1.73 — ABNORMAL LOW (ref >59)

## 2024-07-23 LAB — VITAMIN D 25 HYDROXY (VIT D DEFICIENCY, FRACTURES): Vit D, 25-Hydroxy: 7.6 ng/mL — ABNORMAL LOW (ref 30.0–100.0)

## 2024-07-25 ENCOUNTER — Encounter (INDEPENDENT_AMBULATORY_CARE_PROVIDER_SITE_OTHER): Payer: Self-pay | Admitting: Primary Care

## 2024-07-25 MED ORDER — ATORVASTATIN CALCIUM 80 MG PO TABS: 80.0000 mg | ORAL_TABLET | Freq: Every day | ORAL | 1 refills | Status: AC

## 2024-07-26 ENCOUNTER — Ambulatory Visit: Payer: Self-pay | Admitting: Surgical

## 2024-07-26 NOTE — Progress Notes (Signed)
Need appt to review

## 2024-07-26 NOTE — Progress Notes (Signed)
 I left message requesting return call to get patient scheduled for MRI review.

## 2024-07-29 ENCOUNTER — Ambulatory Visit: Admitting: Surgical

## 2024-08-10 ENCOUNTER — Telehealth (INDEPENDENT_AMBULATORY_CARE_PROVIDER_SITE_OTHER): Payer: Self-pay | Admitting: Primary Care

## 2024-08-10 ENCOUNTER — Ambulatory Visit (INDEPENDENT_AMBULATORY_CARE_PROVIDER_SITE_OTHER): Admitting: Primary Care

## 2024-08-10 VITALS — BP 116/69 | HR 102 | Temp 98.1°F | Resp 16 | Ht 69.0 in | Wt 176.0 lb

## 2024-08-10 DIAGNOSIS — R599 Enlarged lymph nodes, unspecified: Secondary | ICD-10-CM | POA: Diagnosis not present

## 2024-08-10 NOTE — Progress Notes (Signed)
Lump on neck

## 2024-08-10 NOTE — Telephone Encounter (Signed)
 Called [facility] back and spoke with Janella. Relayed that due to kidney disease, patient would not be able to handle contrast.   They will call patient to schedule.

## 2024-08-10 NOTE — Telephone Encounter (Signed)
 Copied from CRM #[id number]. Topic: Clinical - Request for Lab/Test Order >> Aug 10, 2024  4:09 PM Winona SAUNDERS wrote: Janella from  [facility] Calling to find out if the provider would like to switch order to with contrast for the diagnosis code- With contrast is typical for the Ct of the neck unless the pt has underlining medical conditions and can't handle the contrast- please give call back [phone] ext [phone]

## 2024-08-11 ENCOUNTER — Encounter (INDEPENDENT_AMBULATORY_CARE_PROVIDER_SITE_OTHER): Payer: Self-pay | Admitting: Primary Care

## 2024-08-14 NOTE — Progress Notes (Signed)
 [facility]  Garrett Clark, is a 63 y.o. male  RDW:[id number]  FMW:[id number]  DOB - [birth year]  Chief Complaint  Patient presents with   Mass    On neck       Subjective:   Garrett Clark is a 63 y.o. male here today for an acute visit.Concern about mass on neck  HPI  No problems updated.  Comprehensive ROS Pertinent positive and negative noted in HPI   No Known Allergies  Past Medical History:  Diagnosis Date   Cancer (HCC)    Left kidney   Hyperlipidemia    Internal hemorrhoids    Polycystic liver disease    Substance abuse (HCC)     Current Outpatient Medications on File Prior to Visit  Medication Sig Dispense Refill   amLODipine (NORVASC) 10 MG tablet Take 10 mg by mouth daily.     atorvastatin  (LIPITOR) 80 MG tablet Take 1 tablet (80 mg total) by mouth daily. 90 tablet 1   cyclobenzaprine  (FLEXERIL ) 5 MG tablet Take 1 tablet (5 mg total) by mouth 2 (two) times daily as needed for muscle spasms. 10 tablet 0   FARXIGA 10 MG TABS tablet Take 10 mg by mouth every morning.     gabapentin  (NEURONTIN ) 300 MG capsule TAKE 1 CAPSULE BY MOUTH TWICE DAILY 180 capsule 0   spironolactone (ALDACTONE) 25 MG tablet Take 25 mg by mouth once.     valsartan  (DIOVAN ) 80 MG tablet Take 80 mg by mouth daily.     ergocalciferol  (VITAMIN D2) 1.25 MG (50000 UT) capsule Take 1 capsule (50,000 Units total) by mouth once a week. (Patient not taking: Reported on 08/10/2024) 10 capsule 0   omeprazole  (PRILOSEC) 20 MG capsule Take 1 capsule (20 mg total) by mouth daily. (Patient not taking: Reported on 08/10/2024) 30 capsule 0   No current facility-administered medications on file prior to visit.   Health Maintenance  Topic Date Due   Pneumococcal Vaccine for age over 52 (1 of 2 - PCV) Never done   COVID-19 Vaccine (3 - Moderna risk series) 06/26/2020   Zoster (Shingles) Vaccine (2 of 2) 08/07/2022   Flu Shot  07/22/2024   DTaP/Tdap/Td vaccine (2 - Td or Tdap) 03/24/2027    Colon Cancer Screening  05/09/2027   Hepatitis C Screening  Completed   HIV Screening  Completed   Hepatitis B Vaccine  Aged Out   HPV Vaccine  Aged Out   Meningitis B Vaccine  Aged Out    Objective:   Vitals:   08/10/24 1437  BP: 116/69  Pulse: (!) 102  Resp: 16  Temp: 98.1 F (36.7 C)  TempSrc: Oral  SpO2: 98%  Weight: 176 lb (79.8 kg)  Height: 5' 9 (1.753 m)   Physical Exam Vitals reviewed.  Constitutional:      Appearance: Normal appearance.  HENT:     Right Ear: External ear normal.     Left Ear: External ear normal.     Nose: Nose normal.  Eyes:     Extraocular Movements: Extraocular movements intact.  Neck:     Comments: See picture  Cardiovascular:     Rate and Rhythm: Normal rate and regular rhythm.  Pulmonary:     Effort: Pulmonary effort is normal.     Breath sounds: Normal breath sounds.  Abdominal:     General: Bowel sounds are normal. There is distension.     Palpations: Abdomen is soft.  Skin:    General: Skin is warm and  dry.  Neurological:     Mental Status: He is alert and oriented to person, place, and time.  Psychiatric:        Mood and Affect: Mood normal.        Behavior: Behavior normal.     Assessment & Plan   Swollen lymph nodes   -     CT SOFT TISSUE NECK WO CONTRAST     Patient have been counseled extensively about nutrition and exercise. Other issues discussed during this visit include: low cholesterol diet, weight control and daily exercise, foot care, annual eye examinations at Ophthalmology, importance of adherence with medications and regular follow-up. We also discussed long term complications of uncontrolled diabetes and hypertension.   No follow-ups on file.  The patient was given clear instructions to go to ER or return to medical center if symptoms don't improve, worsen or new problems develop. The patient verbalized understanding. The patient was told to call to get lab results if they haven't heard anything in  the next week.   This note has been created with Education officer, environmental. Any transcriptional errors are unintentional.   Rosaline SHAUNNA Bohr, NP 08/14/2024, 6:39 PM

## 2024-08-15 DIAGNOSIS — R748 Abnormal levels of other serum enzymes: Secondary | ICD-10-CM | POA: Diagnosis not present

## 2024-08-15 DIAGNOSIS — Q446 Cystic disease of liver: Secondary | ICD-10-CM | POA: Diagnosis not present

## 2024-08-16 ENCOUNTER — Ambulatory Visit
Admission: RE | Admit: 2024-08-16 | Discharge: 2024-08-16 | Disposition: A | Discharge status: Home / Self Care | Source: Ambulatory Visit | Attending: Primary Care | Admitting: Primary Care

## 2024-08-26 ENCOUNTER — Ambulatory Visit (INDEPENDENT_AMBULATORY_CARE_PROVIDER_SITE_OTHER): Payer: Self-pay

## 2024-08-29 ENCOUNTER — Ambulatory Visit: Admitting: Surgical

## 2024-09-05 ENCOUNTER — Other Ambulatory Visit: Payer: Self-pay | Admitting: Nurse Practitioner

## 2024-09-05 DIAGNOSIS — R748 Abnormal levels of other serum enzymes: Secondary | ICD-10-CM

## 2024-09-05 DIAGNOSIS — I129 Hypertensive chronic kidney disease with stage 1 through stage 4 chronic kidney disease, or unspecified chronic kidney disease: Secondary | ICD-10-CM | POA: Diagnosis not present

## 2024-09-05 DIAGNOSIS — N1832 Chronic kidney disease, stage 3b: Secondary | ICD-10-CM | POA: Diagnosis not present

## 2024-09-05 DIAGNOSIS — R809 Proteinuria, unspecified: Secondary | ICD-10-CM | POA: Diagnosis not present

## 2024-09-05 DIAGNOSIS — R109 Unspecified abdominal pain: Secondary | ICD-10-CM

## 2024-09-05 DIAGNOSIS — E785 Hyperlipidemia, unspecified: Secondary | ICD-10-CM | POA: Diagnosis not present

## 2024-09-07 DIAGNOSIS — R109 Unspecified abdominal pain: Secondary | ICD-10-CM | POA: Diagnosis not present

## 2024-09-07 DIAGNOSIS — R748 Abnormal levels of other serum enzymes: Secondary | ICD-10-CM | POA: Diagnosis not present

## 2024-09-08 ENCOUNTER — Inpatient Hospital Stay
Admission: RE | Admit: 2024-09-08 | Discharge: 2024-09-08 | Discharge status: Home / Self Care | Source: Ambulatory Visit | Attending: Nurse Practitioner

## 2024-09-08 DIAGNOSIS — R109 Unspecified abdominal pain: Secondary | ICD-10-CM

## 2024-09-08 DIAGNOSIS — R748 Abnormal levels of other serum enzymes: Secondary | ICD-10-CM | POA: Diagnosis not present

## 2024-09-20 ENCOUNTER — Ambulatory Visit (HOSPITAL_COMMUNITY)
Admission: RE | Admit: 2024-09-20 | Discharge: 2024-09-20 | Disposition: A | Discharge status: Home / Self Care | Payer: Self-pay | Source: Ambulatory Visit | Attending: Family Medicine | Admitting: Family Medicine

## 2024-09-20 ENCOUNTER — Encounter (HOSPITAL_COMMUNITY): Payer: Self-pay

## 2024-09-20 VITALS — BP 117/73 | HR 68 | Temp 98.4°F | Resp 16

## 2024-09-20 DIAGNOSIS — H538 Other visual disturbances: Secondary | ICD-10-CM

## 2024-09-20 LAB — POCT FASTING CBG KUC MANUAL ENTRY: POCT Glucose (KUC): 106 mg/dL — AB (ref 70–99)

## 2024-09-20 NOTE — ED Provider Notes (Signed)
 [facility]   [id number] 09/20/24 Arrival Time: 1041  ASSESSMENT & PLAN:  1. Blurry vision, left eye     Follow-up Information     Schedule an appointment as soon as possible for a visit  with Grissom, Nicholas B, MD.   Specialty: Ophthalmology Contact information: [address] [place] [address] [phone]                May f/u here as needed otherwise.  Reviewed expectations re: course of current medical issues. Questions answered. Outlined signs and symptoms indicating need for more acute intervention. Patient verbalized understanding. After Visit Summary given.   SUBJECTIVE:  Garrett Clark is a 63 y.o. male who presents with complaint of blurry vision; L eye; x several months; grad onset; denies eye pain. Does not wear corrective lenses/contacts. Denies eye injury.  OBJECTIVE:  Vitals:   09/20/24 1124  BP: 117/73  Pulse: 68  Resp: 16  Temp: 98.4 F (36.9 C)  TempSrc: Oral  SpO2: 97%    General appearance: alert; no distress HEENT: [place]; AT; PERRLA; no restriction of the extraocular movements OS: without reported pain; without conjunctival injection; without drainage; without corneal opacities; without limbal flush; without periorbital swelling or erythema OD: w/o:[id number]::without} reported pain; without conjunctival injection; without drainage; without corneal opacities; without limbal flush; without periorbital swelling or erythema Neck: supple without LAD Lungs: clear to auscultation bilaterally; unlabored respirations Heart: regular rate and rhythm Skin: warm and dry Psychological: alert and cooperative; normal mood and affect    Visual Acuity  Right Eye Distance: 20/25 Left Eye Distance: 20/50 Bilateral Distance: 20/25  Right Eye Near:   Left Eye Near:    Bilateral Near:     No Known Allergies  Past Medical History:  Diagnosis Date   Cancer (HCC)    Left kidney   Hyperlipidemia    Internal hemorrhoids     Polycystic liver disease    Substance abuse (HCC)    Social History   Socioeconomic History   Marital status: Single    Spouse name: Not on file   Number of children: 2   Years of education: 12   Highest education level: Not on file  Occupational History    Employer: [employer]  Tobacco Use   Smoking status: Former    Current packs/day: 0.00    Types: Cigarettes    Quit date: 12/03/2012    Years since quitting: 11.8   Smokeless tobacco: Never  Vaping Use   Vaping status: Never Used  Substance and Sexual Activity   Alcohol use: Not Currently    Comment: Occasional   Drug use: Yes    Types: Marijuana    Comment: 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  Has 2 children.  Works at [employer].  Education: high school.    Social Drivers of Corporate investment banker Strain: Not on file  Food Insecurity: Medium Risk (08/15/2024)   Received from [facility]   Hunger Vital Sign    Within the past 12 months, you worried that your food would run out before you got money to buy more: Never true    Within the past 12 months, the food you  bought just didn't last and you didn't have money to get more. : Sometimes true  Transportation Needs: No Transportation Needs (08/15/2024)   Received from [facility]   Transportation    In the past 12 months, has lack of reliable transportation kept you from medical appointments, meetings, work or from getting things needed for daily living? : No  Physical Activity: Not on file  Stress: Not on file  Social Connections: Not on file  Intimate Partner Violence: Not At Risk (04/20/2024)   Humiliation, Afraid, Rape, and Kick questionnaire    Fear of Current or Ex-Partner: No    Emotionally Abused: No     Physically Abused: No    Sexually Abused: No   Family History  Problem Relation Age of Onset   Diabetes Mother    Hypertension Mother    Alcohol abuse Mother    Diabetes Father    Kidney disease Father    Diabetes Sister    Stroke Sister    Diabetes Paternal Grandmother    Cancer Paternal Grandfather        type unknown   Colon cancer Neg Hx    Past Surgical History:  Procedure Laterality Date   COLONOSCOPY     HEMORRHOID BANDING     ROBOT ASSISTED LAPAROSCOPIC NEPHRECTOMY Left 12/07/2017   Procedure: XI ROBOTIC ASSISTED LAPAROSCOPIC NEPHRECTOMY;  Surgeon: Sherrilee Belvie CROME, MD;  Location: [facility];  Service: Urology;  Laterality: Left;      Rolinda Rogue, MD 09/20/24 1438

## 2024-09-20 NOTE — ED Triage Notes (Signed)
 Patient here today with c/o left eye visual blurriness for months now. Patient states that sometimes he will see a black dot. Denies pain. Denies headache. Denies chest pain. Patient has a h/o HTN. Patient has one kidney due to kidney cancer.

## 2024-09-20 NOTE — Discharge Instructions (Addendum)
 Visual Acuity  Right Eye Distance: 20/25 Left Eye Distance: 20/50 Bilateral Distance: 20/25  Right Eye Near:   Left Eye Near:    Bilateral Near:

## 2024-10-07 ENCOUNTER — Encounter (INDEPENDENT_AMBULATORY_CARE_PROVIDER_SITE_OTHER): Payer: Self-pay | Admitting: Primary Care

## 2024-10-07 DIAGNOSIS — E559 Vitamin D deficiency, unspecified: Secondary | ICD-10-CM | POA: Insufficient documentation

## 2024-10-11 ENCOUNTER — Other Ambulatory Visit: Payer: Self-pay | Admitting: Physical Medicine and Rehabilitation

## 2024-10-11 ENCOUNTER — Ambulatory Visit: Payer: Self-pay

## 2024-10-11 ENCOUNTER — Telehealth: Payer: Self-pay | Admitting: Physical Medicine and Rehabilitation

## 2024-10-11 ENCOUNTER — Telehealth: Payer: Self-pay

## 2024-10-11 DIAGNOSIS — M5416 Radiculopathy, lumbar region: Secondary | ICD-10-CM

## 2024-10-11 NOTE — Telephone Encounter (Addendum)
 FYI Only or Action Required?: FYI only for provider.  Patient was last seen in primary care on 08/10/2024 by Celestia Rosaline SQUIBB, NP.  Called Nurse Triage reporting Blurred Vision.  Symptoms began about a month ago.  Interventions attempted: Nothing.  Symptoms are: unchanged.  Triage Disposition: See HCP Within 4 Hours (Or PCP Triage)  Patient/caregiver understands and will follow disposition?: Yes   **Appt. Scheduled for 11/5; see note below, referred to mobile bus as well**      Copied from CRM #[id number]. Topic: Appointments - Appointment Scheduling >> Oct 11, 2024  9:18 AM Tonda B wrote: Patient/patient representative is calling to schedule an appointment. Refer to attachments for appointment information.  Patient is calling in with blurry vision Reason for Disposition  [1] Eye pain AND [2] brief (now gone) blurred vision or visual changes  Answer Assessment - Initial Assessment Questions 1. DESCRIPTION: How has your vision changed? (e.g., complete vision loss, blurred vision, double vision, floaters, etc.)      blurred vision  2. LOCATION: One or both eyes? If one, ask: Which eye?     Right eye, he reports intermittent eye pain   3. SEVERITY: Can you see anything? If Yes, ask: What can you see? (e.g., fine print)     Yes patient can see out of the eye  4. ONSET: When did this begin? Did it start suddenly or has this been gradual?     X 1 month  5. PATTERN: Does this come and go, or has it been constant since it started?     Constant blurred vision, pain is intermittent, and goes away when patient lays down to rest   6. PAIN: Is there any pain in your eye(s)?  (Scale 1-10; or mild, moderate, severe)     Mild   7. CONTACTS-GLASSES: Do you wear contacts or glasses?     No   8. CAUSE: What do you think is causing this visual problem?     Unsure   9. OTHER SYMPTOMS: Do you have any other symptoms? (e.g., confusion, headache, arm or leg  weakness, speech problems)   No.   Patient was seen in UC  2 weeks ago and no significant findings found. Patient referred to eye doctor/ UC for symptoms as there is no availability at this office of surrounding offices within a reasonable time frame. Appt. Scheduled for 11/5 for follow-up. Patient agrees with plan of care.  Protocols used: Vision Loss or Change-A-AH

## 2024-10-11 NOTE — Telephone Encounter (Signed)
 FYI

## 2024-10-11 NOTE — Telephone Encounter (Signed)
 Last injection 6/25 % 80 relief/function ability Duration of relief/ improvement---4 months Current pain score--7 Recent falls or injuries Same pain and same location as last time.

## 2024-10-11 NOTE — Telephone Encounter (Signed)
 Pt request appt for another back injection

## 2024-10-12 NOTE — Telephone Encounter (Signed)
 noted

## 2024-10-19 DIAGNOSIS — I129 Hypertensive chronic kidney disease with stage 1 through stage 4 chronic kidney disease, or unspecified chronic kidney disease: Secondary | ICD-10-CM | POA: Diagnosis not present

## 2024-10-19 DIAGNOSIS — R809 Proteinuria, unspecified: Secondary | ICD-10-CM | POA: Diagnosis not present

## 2024-10-19 DIAGNOSIS — E785 Hyperlipidemia, unspecified: Secondary | ICD-10-CM | POA: Diagnosis not present

## 2024-10-19 DIAGNOSIS — N1832 Chronic kidney disease, stage 3b: Secondary | ICD-10-CM | POA: Diagnosis not present

## 2024-10-19 DIAGNOSIS — R8290 Unspecified abnormal findings in urine: Secondary | ICD-10-CM | POA: Diagnosis not present

## 2024-10-24 ENCOUNTER — Encounter: Payer: Self-pay | Admitting: Radiology

## 2024-10-24 DIAGNOSIS — N1832 Chronic kidney disease, stage 3b: Secondary | ICD-10-CM | POA: Diagnosis not present

## 2024-10-24 DIAGNOSIS — E785 Hyperlipidemia, unspecified: Secondary | ICD-10-CM | POA: Diagnosis not present

## 2024-10-24 DIAGNOSIS — I129 Hypertensive chronic kidney disease with stage 1 through stage 4 chronic kidney disease, or unspecified chronic kidney disease: Secondary | ICD-10-CM | POA: Diagnosis not present

## 2024-10-24 DIAGNOSIS — R809 Proteinuria, unspecified: Secondary | ICD-10-CM | POA: Diagnosis not present

## 2024-10-26 ENCOUNTER — Encounter: Payer: Self-pay | Admitting: Nurse Practitioner

## 2024-10-26 ENCOUNTER — Ambulatory Visit (INDEPENDENT_AMBULATORY_CARE_PROVIDER_SITE_OTHER): Payer: Self-pay | Admitting: Nurse Practitioner

## 2024-10-26 VITALS — BP 101/63 | HR 88 | Wt 180.0 lb

## 2024-10-26 DIAGNOSIS — H538 Other visual disturbances: Secondary | ICD-10-CM | POA: Insufficient documentation

## 2024-10-26 NOTE — Assessment & Plan Note (Signed)
 Vision Screening   Right eye Left eye Both eyes  Without correction 20/25-1 20/30-2 20/25-1  With correction      Blurry vision and black spots in vision Chronic blurry vision with intermittent black spots. No prior ophthalmology evaluation. - Provided contact information for the previously referred ophthalmologist. - Advised to schedule an appointment with the ophthalmologist before - Suggested contacting [facility] for an eye examination if unable to schedule with the referred ophthalmologist. - Provided a list of other local ophthalmologists.

## 2024-10-26 NOTE — Patient Instructions (Signed)
 Garrett Mabel NOVAK, MD.   Specialty: Ophthalmology Contact information: [address] [place] [address] [phone]   Eye Doctors That Accept Medicaid and/or [facility]  [facility] [address], [address]  [place], [place] [address] [phone] [email or web address]   [facility] [address] [place], [place] [address] [phone] [email or web address]   [facility] [place] [address] [place], [place] [address] Located next to [employer] Phone: [phone] [facility] [address] [place], [place] [address] Located next to [employer] Phone: [phone] [email or web address]   [facility] [address] [place], [place] [address] [phone] [email or web address]   [facility] [address] [place], [place] [address] [phone] [email or web address]   [facility] [address]  [place], [place] [address] [phone] [email or web address]         It is important that you exercise regularly at least 30 minutes 5 times a week as tolerated  Think about what you will eat, plan ahead. Choose  clean, green, fresh or frozen over canned, processed or packaged foods which are more sugary, salty and fatty. 70 to 75% of food eaten should be vegetables and fruit. Three meals at set times with snacks allowed between meals, but they must be fruit or vegetables. Aim to eat over a 12 hour period , example 7 am to 7 pm, and STOP after  your last meal of the day. Drink water ,generally about 64 ounces per day, no other drink is as healthy. Fruit juice is best enjoyed in a healthy way, by EATING the fruit.  Thanks for choosing [facility] we consider it a privelige to serve you.

## 2024-10-26 NOTE — Progress Notes (Signed)
 Acute Office Visit  Subjective:     Patient ID: Garrett Clark, male    DOB: 08/29/1961, 63 y.o.   MRN: [id number]  Chief Complaint  Patient presents with   Blurred Vision    HPI    Discussed the use of AI scribe software for clinical note transcription with the patient, who gave verbal consent to proceed.  History of Present Illness Garrett Clark is a 63 year old male  has a past medical history of Cancer (HCC), Hyperlipidemia, Internal hemorrhoids, Polycystic liver disease, Substance abuse (HCC), and Vitamin D  deficiency (10/07/2024).  who presents with blurry vision and black spots in his eyes.  He has been experiencing blurry vision and seeing black spots in his eyes for an unspecified duration. He has not yet consulted an eye doctor regarding these issues.  He was asked to urgent care on September 20, 2024 for same complaint he was referred to ophthalmologist but he did not follow-up. He is concerned about scheduling an appointment before the 17th of this month, as he will not be able to attend after that date.  No pain or discharge from his eyes, although he notes occasional soreness around the right eye.    Assessment & Plan     Review of Systems  Constitutional:  Negative for appetite change, chills, fatigue and fever.  HENT:  Negative for congestion, postnasal drip, rhinorrhea and sneezing.   Eyes:  Positive for pain and visual disturbance. Negative for discharge and itching.  Respiratory:  Negative for cough, shortness of breath and wheezing.   Cardiovascular:  Negative for chest pain, palpitations and leg swelling.  Gastrointestinal:  Negative for abdominal pain, constipation, nausea and vomiting.  Genitourinary:  Negative for difficulty urinating, dysuria, flank pain and frequency.  Musculoskeletal:  Negative for arthralgias, back pain, joint swelling and myalgias.  Skin:  Negative for color change, pallor, rash and wound.  Neurological:  Negative  for dizziness, facial asymmetry, weakness, numbness and headaches.  Psychiatric/Behavioral:  Negative for behavioral problems, confusion, self-injury and suicidal ideas.         Objective:    BP 101/63   Pulse 88   Wt 180 lb (81.6 kg)   SpO2 100%   BMI 26.58 kg/m    Physical Exam Vitals and nursing note reviewed.  Constitutional:      General: He is not in acute distress.    Appearance: Normal appearance. He is not ill-appearing, toxic-appearing or diaphoretic.  HENT:     Mouth/Throat:     Mouth: Mucous membranes are moist.     Pharynx: Oropharynx is clear. No oropharyngeal exudate or posterior oropharyngeal erythema.  Eyes:     General: No scleral icterus.       Right eye: No discharge.        Left eye: No discharge.     Extraocular Movements: Extraocular movements intact.     Conjunctiva/sclera: Conjunctivae normal.  Cardiovascular:     Rate and Rhythm: Normal rate and regular rhythm.     Pulses: Normal pulses.     Heart sounds: Normal heart sounds. No murmur heard.    No friction rub. No gallop.  Pulmonary:     Effort: Pulmonary effort is normal. No respiratory distress.     Breath sounds: Normal breath sounds. No stridor. No wheezing, rhonchi or rales.  Chest:     Chest wall: No tenderness.  Abdominal:     General: There is no distension.     Palpations: Abdomen  is soft.     Tenderness: There is no abdominal tenderness. There is no right CVA tenderness, left CVA tenderness or guarding.  Musculoskeletal:        General: No swelling, tenderness, deformity or signs of injury.     Right lower leg: No edema.     Left lower leg: No edema.  Skin:    General: Skin is warm and dry.     Capillary Refill: Capillary refill takes less than 2 seconds.     Coloration: Skin is not jaundiced.     Findings: No bruising, erythema or lesion.  Neurological:     Mental Status: He is alert and oriented to person, place, and time.     Motor: No weakness.     Gait: Gait normal.   Psychiatric:        Mood and Affect: Mood normal.        Behavior: Behavior normal.        Thought Content: Thought content normal.        Judgment: Judgment normal.     No results found for any visits on 10/26/24.      Assessment & Plan:   Problem List Items Addressed This Visit       Other   Blurry vision, bilateral - Primary   Vision Screening   Right eye Left eye Both eyes  Without correction 20/25-1 20/30-2 20/25-1  With correction      Blurry vision and black spots in vision Chronic blurry vision with intermittent black spots. No prior ophthalmology evaluation. - Provided contact information for the previously referred ophthalmologist. - Advised to schedule an appointment with the ophthalmologist before - Suggested contacting [facility] for an eye examination if unable to schedule with the referred ophthalmologist. - Provided a list of other local ophthalmologists.       No orders of the defined types were placed in this encounter.   No follow-ups on file.  Bishoy Cupp R Darriel Utter, FNP

## 2025-01-17 ENCOUNTER — Telehealth: Payer: Self-pay

## 2025-01-17 DIAGNOSIS — N184 Chronic kidney disease, stage 4 (severe): Secondary | ICD-10-CM

## 2025-01-17 NOTE — Progress Notes (Signed)
 Thank you for this referral. The [facility] team receives referrals for patients who meet Complex Care Management program criteria: chronic conditions including heart failure, stroke, COPD, ESRD, Sickle Cell, Diabetes with complications, Mental/Behavioral Health diagnosis, substance abuse/misuse and whose Primary Care Provider is a [facility] provider or ACO contracted building control surveyor in [place]).  Does not meet Complex Care Management program criteria.  Per patients POA, patient is currently incarcerated.   Garrett Clark [facility]  [facility] VBCI Assistant Direct Dial: [phone]  Fax: [phone]
